# Patient Record
Sex: Male | Born: 1940 | Race: White | Hispanic: No | Marital: Married | Smoking: Never smoker
Health system: Southern US, Community
[De-identification: ages and names within clinical notes are randomized; demographics above are authoritative.]

## PROBLEM LIST (undated history)

## (undated) DIAGNOSIS — E785 Hyperlipidemia, unspecified: Secondary | ICD-10-CM

## (undated) HISTORY — DX: Hyperlipidemia, unspecified: E78.5

## (undated) HISTORY — PX: EYE SURGERY: SHX253

---

## 1997-05-10 HISTORY — PX: OTHER SURGICAL HISTORY: SHX169

## 2000-05-10 ENCOUNTER — Encounter: Payer: Self-pay | Admitting: Family Medicine

## 2001-12-08 ENCOUNTER — Encounter: Payer: Self-pay | Admitting: Family Medicine

## 2001-12-08 LAB — CONVERTED CEMR LAB: PSA: 0.6 ng/mL

## 2003-10-09 ENCOUNTER — Encounter: Payer: Self-pay | Admitting: Family Medicine

## 2003-10-09 LAB — CONVERTED CEMR LAB
PSA: 0.5 ng/mL
PSA: 0.5 ng/mL

## 2004-10-08 ENCOUNTER — Encounter: Payer: Self-pay | Admitting: Family Medicine

## 2004-10-08 LAB — CONVERTED CEMR LAB
PSA: 0.64 ng/mL
PSA: 0.64 ng/mL

## 2004-10-12 ENCOUNTER — Ambulatory Visit: Payer: Self-pay | Admitting: Family Medicine

## 2004-10-16 ENCOUNTER — Ambulatory Visit: Payer: Self-pay | Admitting: Family Medicine

## 2004-12-01 ENCOUNTER — Ambulatory Visit: Payer: Self-pay | Admitting: Family Medicine

## 2005-10-08 ENCOUNTER — Encounter: Payer: Self-pay | Admitting: Family Medicine

## 2005-10-08 LAB — CONVERTED CEMR LAB: PSA: 0.53 ng/mL

## 2005-10-18 ENCOUNTER — Ambulatory Visit: Payer: Self-pay | Admitting: Family Medicine

## 2005-10-21 ENCOUNTER — Ambulatory Visit: Payer: Self-pay | Admitting: Family Medicine

## 2005-11-05 ENCOUNTER — Ambulatory Visit: Payer: Self-pay | Admitting: Family Medicine

## 2006-03-22 ENCOUNTER — Ambulatory Visit: Payer: Self-pay | Admitting: Family Medicine

## 2006-10-25 ENCOUNTER — Ambulatory Visit: Payer: Self-pay | Admitting: Family Medicine

## 2006-10-25 LAB — CONVERTED CEMR LAB
ALT: 24 units/L (ref 0–40)
AST: 20 units/L (ref 0–37)
Alkaline Phosphatase: 70 units/L (ref 39–117)
BUN: 18 mg/dL (ref 6–23)
Bilirubin, Direct: 0.1 mg/dL (ref 0.0–0.3)
CO2: 29 meq/L (ref 19–32)
Calcium: 9.4 mg/dL (ref 8.4–10.5)
Creatinine, Ser: 0.8 mg/dL (ref 0.4–1.5)
GFR calc Af Amer: 125 mL/min
Glucose, Bld: 93 mg/dL (ref 70–99)
HDL: 71.3 mg/dL (ref >39.0)
Microalb Creat Ratio: 4.4 mg/g (ref 0.0–30.0)
Total Bilirubin: 0.9 mg/dL (ref 0.3–1.2)
Total CHOL/HDL Ratio: 2.8
Total Protein: 6.6 g/dL (ref 6.0–8.3)
Triglycerides: 48 mg/dL (ref 0–149)

## 2006-10-26 ENCOUNTER — Encounter: Payer: Self-pay | Admitting: Family Medicine

## 2006-10-26 DIAGNOSIS — M1990 Unspecified osteoarthritis, unspecified site: Secondary | ICD-10-CM | POA: Insufficient documentation

## 2006-10-26 DIAGNOSIS — E7800 Pure hypercholesterolemia, unspecified: Secondary | ICD-10-CM | POA: Insufficient documentation

## 2006-10-26 DIAGNOSIS — H9193 Unspecified hearing loss, bilateral: Secondary | ICD-10-CM | POA: Insufficient documentation

## 2006-10-26 DIAGNOSIS — E749 Disorder of carbohydrate metabolism, unspecified: Secondary | ICD-10-CM | POA: Insufficient documentation

## 2006-10-26 DIAGNOSIS — B009 Herpesviral infection, unspecified: Secondary | ICD-10-CM | POA: Insufficient documentation

## 2006-10-28 ENCOUNTER — Ambulatory Visit: Payer: Self-pay | Admitting: Family Medicine

## 2006-10-28 LAB — CONVERTED CEMR LAB
Glucose, Urine, Semiquant: NEGATIVE
Nitrite: NEGATIVE
Protein, U semiquant: NEGATIVE
Specific Gravity, Urine: 1.01
pH: 5

## 2006-11-09 ENCOUNTER — Encounter (INDEPENDENT_AMBULATORY_CARE_PROVIDER_SITE_OTHER): Payer: Self-pay | Admitting: *Deleted

## 2006-11-09 ENCOUNTER — Ambulatory Visit: Payer: Self-pay | Admitting: Family Medicine

## 2007-03-10 ENCOUNTER — Ambulatory Visit: Payer: Self-pay | Admitting: Family Medicine

## 2007-03-31 ENCOUNTER — Ambulatory Visit: Payer: Self-pay | Admitting: Family Medicine

## 2007-10-18 ENCOUNTER — Ambulatory Visit: Payer: Self-pay | Admitting: Family Medicine

## 2007-10-18 LAB — CONVERTED CEMR LAB
ALT: 20 units/L (ref 0–53)
AST: 23 units/L (ref 0–37)
Basophils Absolute: 0 10*3/uL (ref 0.0–0.1)
Basophils Relative: 0 % (ref 0.0–1.0)
Bilirubin, Direct: 0.1 mg/dL (ref 0.0–0.3)
CO2: 29 meq/L (ref 19–32)
Chloride: 104 meq/L (ref 96–112)
Cholesterol: 171 mg/dL (ref 0–200)
Glucose, Bld: 94 mg/dL (ref 70–99)
LDL Cholesterol: 90 mg/dL (ref 0–99)
Lymphocytes Relative: 29.3 % (ref 12.0–46.0)
MCHC: 34.7 g/dL (ref 30.0–36.0)
Monocytes Relative: 9.2 % (ref 3.0–12.0)
Neutrophils Relative %: 58.2 % (ref 43.0–77.0)
RBC: 4.66 M/uL (ref 4.22–5.81)
RDW: 12.5 % (ref 11.5–14.6)
Sodium: 141 meq/L (ref 135–145)
TSH: 1.15 microintl units/mL (ref 0.35–5.50)
Total Bilirubin: 1.2 mg/dL (ref 0.3–1.2)
Total CHOL/HDL Ratio: 2.3
VLDL: 6 mg/dL (ref 0–40)

## 2007-10-19 ENCOUNTER — Ambulatory Visit: Payer: Self-pay | Admitting: Family Medicine

## 2007-10-26 ENCOUNTER — Encounter: Payer: Self-pay | Admitting: Family Medicine

## 2007-12-07 ENCOUNTER — Encounter (INDEPENDENT_AMBULATORY_CARE_PROVIDER_SITE_OTHER): Payer: Self-pay | Admitting: *Deleted

## 2007-12-07 ENCOUNTER — Ambulatory Visit: Payer: Self-pay | Admitting: Family Medicine

## 2007-12-07 LAB — FECAL OCCULT BLOOD, GUAIAC: Fecal Occult Blood: NEGATIVE

## 2007-12-07 LAB — CONVERTED CEMR LAB
OCCULT 1: NEGATIVE
OCCULT 2: NEGATIVE
OCCULT 3: NEGATIVE

## 2008-01-05 ENCOUNTER — Encounter: Payer: Self-pay | Admitting: Family Medicine

## 2008-02-07 ENCOUNTER — Ambulatory Visit: Payer: Self-pay | Admitting: Family Medicine

## 2008-09-18 ENCOUNTER — Emergency Department (HOSPITAL_COMMUNITY): Admission: EM | Admit: 2008-09-18 | Discharge: 2008-09-18 | Payer: Self-pay | Admitting: Family Medicine

## 2008-10-22 ENCOUNTER — Ambulatory Visit: Payer: Self-pay | Admitting: Family Medicine

## 2008-10-22 LAB — CONVERTED CEMR LAB
ALT: 20 units/L (ref 0–53)
BUN: 18 mg/dL (ref 6–23)
CO2: 29 meq/L (ref 19–32)
Chloride: 108 meq/L (ref 96–112)
Cholesterol: 183 mg/dL (ref 0–200)
Eosinophils Relative: 1.8 % (ref 0.0–5.0)
Glucose, Bld: 104 mg/dL — ABNORMAL HIGH (ref 70–99)
HCT: 42.2 % (ref 39.0–52.0)
Lymphs Abs: 1.4 10*3/uL (ref 0.7–4.0)
MCV: 91.9 fL (ref 78.0–100.0)
Microalb, Ur: 0.5 mg/dL (ref 0.0–1.9)
Monocytes Absolute: 0.5 10*3/uL (ref 0.1–1.0)
PSA: 1.17 ng/mL (ref 0.10–4.00)
Platelets: 217 10*3/uL (ref 150.0–400.0)
Potassium: 4.1 meq/L (ref 3.5–5.1)
Total Bilirubin: 0.9 mg/dL (ref 0.3–1.2)
Total Protein: 6.7 g/dL (ref 6.0–8.3)
WBC: 6.1 10*3/uL (ref 4.5–10.5)

## 2008-10-24 LAB — CONVERTED CEMR LAB: Vit D, 25-Hydroxy: 32 ng/mL (ref 30–89)

## 2008-10-31 ENCOUNTER — Ambulatory Visit: Payer: Self-pay | Admitting: Family Medicine

## 2009-02-12 ENCOUNTER — Ambulatory Visit: Payer: Self-pay | Admitting: Family Medicine

## 2009-03-01 ENCOUNTER — Emergency Department (HOSPITAL_COMMUNITY): Admission: EM | Admit: 2009-03-01 | Discharge: 2009-03-02 | Payer: Self-pay | Admitting: Emergency Medicine

## 2009-03-25 ENCOUNTER — Ambulatory Visit: Payer: Self-pay | Admitting: Family Medicine

## 2009-03-31 ENCOUNTER — Ambulatory Visit: Payer: Self-pay | Admitting: Family Medicine

## 2009-10-03 ENCOUNTER — Emergency Department (HOSPITAL_COMMUNITY): Admission: EM | Admit: 2009-10-03 | Discharge: 2009-10-03 | Payer: Self-pay | Admitting: Emergency Medicine

## 2009-10-07 ENCOUNTER — Ambulatory Visit: Payer: Self-pay | Admitting: Family Medicine

## 2009-10-14 ENCOUNTER — Ambulatory Visit: Payer: Self-pay | Admitting: Family Medicine

## 2009-10-23 ENCOUNTER — Telehealth: Payer: Self-pay | Admitting: Family Medicine

## 2009-10-30 ENCOUNTER — Ambulatory Visit: Payer: Self-pay | Admitting: Family Medicine

## 2009-10-30 LAB — CONVERTED CEMR LAB
Albumin: 4.2 g/dL (ref 3.5–5.2)
Basophils Absolute: 0 10*3/uL (ref 0.0–0.1)
CO2: 30 meq/L (ref 19–32)
Calcium: 9.6 mg/dL (ref 8.4–10.5)
Cholesterol: 200 mg/dL (ref 0–200)
Creatinine, Ser: 0.8 mg/dL (ref 0.4–1.5)
Glucose, Bld: 97 mg/dL (ref 70–99)
HCT: 42.9 % (ref 39.0–52.0)
HDL: 87.8 mg/dL (ref >39.00)
Lymphs Abs: 1.5 10*3/uL (ref 0.7–4.0)
MCV: 92.2 fL (ref 78.0–100.0)
Microalb, Ur: 0.4 mg/dL (ref 0.0–1.9)
Monocytes Absolute: 0.5 10*3/uL (ref 0.1–1.0)
Platelets: 226 10*3/uL (ref 150.0–400.0)
RDW: 13.7 % (ref 11.5–14.6)
TSH: 1.29 microintl units/mL (ref 0.35–5.50)
Total Protein: 6.8 g/dL (ref 6.0–8.3)
Triglycerides: 39 mg/dL (ref 0.0–149.0)

## 2009-11-05 ENCOUNTER — Ambulatory Visit: Payer: Self-pay | Admitting: Family Medicine

## 2009-11-05 LAB — CONVERTED CEMR LAB
Glucose, Urine, Semiquant: NEGATIVE
Ketones, urine, test strip: NEGATIVE
Nitrite: NEGATIVE
Urobilinogen, UA: 0.2

## 2009-12-16 ENCOUNTER — Encounter (INDEPENDENT_AMBULATORY_CARE_PROVIDER_SITE_OTHER): Payer: Self-pay | Admitting: *Deleted

## 2010-02-10 ENCOUNTER — Ambulatory Visit: Payer: Self-pay | Admitting: Family Medicine

## 2010-02-18 ENCOUNTER — Ambulatory Visit: Payer: Self-pay | Admitting: Family Medicine

## 2010-02-18 DIAGNOSIS — L03039 Cellulitis of unspecified toe: Secondary | ICD-10-CM | POA: Insufficient documentation

## 2010-06-09 NOTE — Assessment & Plan Note (Signed)
Summary: ?INFECTED TOENAIL/CLE   Vital Signs:  Patient profile:   70 year old male Height:      67 inches Weight:      153 pounds BMI:     24.05 Temp:     97.5 degrees F oral Pulse rate:   72 / minute Pulse rhythm:   regular BP sitting:   110 / 60  (right arm) Cuff size:   regular  Vitals Entered By: Linde Gillis CMA Duncan Dull) (February 18, 2010 12:13 PM) CC: infected toenail   History of Present Illness: 70 yo here for ? infected toe.  Last year, had ingrown toe nail requiring part of his toenail to be removed (left great toe).  A couple of days ago, same toe became very red, painful.  He soaked it and applied hydrogen peroxide to it.  Seemed to drain something and now no longer red or painful.  Wanted to make sure it was ok. No fevers, chills, nausea or other systemic symptoms.  Current Medications (verified): 1)  Acyclovir 200 Mg  Caps (Acyclovir) .Marland Kitchen.. 1 By Mouth Two Times A Day  (Dr. Dagoberto Ligas) 2)  One-Daily Multivitamins   Tabs (Multiple Vitamin) .Marland Kitchen.. 1 Daily By Mouth 3)  Adult Aspirin Low Strength 81 Mg  Tbdp (Aspirin) .... One A Day 4)  Zostavax 16109 Unt/0.50ml Solr (Zoster Vaccine Live) .... Administer To Pt 5)  Cephalexin 500 Mg  Tabs (Cephalexin) .... Take One By Mouth Two Times A Day X 10 Days  Allergies (verified): No Known Drug Allergies  Past History:  Past Medical History: Last updated: 10/26/2006 Hyperlipidemia Osteoarthritis  Past Surgical History: Last updated: November 15, 2009 Abnl GTT Hyperglycemia 1980 R Thumb surgery  Hebreden's node  ([place]her) 1999 Colonoscopy Sm Int Hemms o/w Nml  (Dr Loreta Ave) 01/05/2008 Catarract Left Eye 11/10  Family History: Last updated: Nov 15, 2009 Father: Died 11 arthritis, sleep apnea Mother: Died 21 Peritonitis due to colonic rupture Brother dec 55 ETOH Brother A 70  Craige Cotta)  Colonic Polyps ([employer]) (retired) Brother A  67 Rocky Link) Skin Ca  [employer] (works with son) CA:  Prostate PGF Polyps:  Brother ETOH:  Brother  (+) Stroke:  (-)  Social History: Last updated: 10/31/2008 Marital Status: Married lives with wife Children: 1 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 GI:  Denies nausea and vomiting.  Physical Exam  General:  Well-developed,well-nourished,in no acute distress; alert,appropriate and cooperative throughout examination, nontoxic. Msk:  left great toe- no ingrown toenail, no obvious signs of infection- non tender, no swelling or erythema. Extremities:  no edema Neurologic:  alert & oriented X3 and gait normal.   Psych:  Cognition and judgment appear intact. Alert and cooperative with normal attention span and concentration. No apparent delusions, illusions, hallucinations   Impression & Recommendations:  Problem # 1:  ONYCHIA AND PARONYCHIA OF TOE (ICD-681.11) Assessment New Likely drained the abscess on his own. Will treat with Keflex.  If symptoms return, needs to follow up for drainage. His updated medication list for this problem includes:    Cephalexin 500 Mg Tabs (Cephalexin) .Marland Kitchen... Take one by mouth two times a day  x 10 days  Orders: Prescription Created Electronically [phone])  Complete Medication List: 1)  Acyclovir 200 Mg Caps (Acyclovir) .Marland Kitchen.. 1 by mouth two times a day  (dr. Dagoberto Ligas) 2)  One-daily Multivitamins Tabs (Multiple vitamin) .Marland Kitchen.. 1 daily by mouth 3)  Adult Aspirin Low Strength 81 Mg Tbdp (Aspirin) .... One a day 4)  Zostavax 60454 Unt/0.70ml Solr (Zoster vaccine live) .... Administer to pt 5)  Cephalexin  500 Mg Tabs (Cephalexin) .... Take one by mouth two times a day x 10 days Prescriptions: CEPHALEXIN 500 MG  TABS (CEPHALEXIN) take one by mouth two times a day x 10 days  #20 x 0   Entered and Authorized by:   Ruthe Mannan MD   Signed by:   Ruthe Mannan MD on 02/18/2010   Method used:   Electronically to        [facility]  [address] (retail)       [address]       [place], [place]  [address]       Ph: [phone] or [phone]       Fax: [phone]   RxID:   [id number]   Current Allergies (reviewed today): No known allergies

## 2010-06-09 NOTE — Assessment & Plan Note (Signed)
Summary: FLU SHOT/Bryan Avila/DLO  Nurse Visit   Allergies: No Known Drug Allergies  Orders Added: 1)  Flu Vaccine 47yrs + MEDICARE PATIENTS [Q2039] 2)  Administration Flu vaccine - MCR [G0008]  Flu Vaccine Consent Questions     Do you have a history of severe allergic reactions to this vaccine? no    Any prior history of allergic reactions to egg and/or gelatin? no    Do you have a sensitivity to the preservative Thimersol? no    Do you have a past history of Guillan-Barre Syndrome? no    Do you currently have an acute febrile illness? no    Have you ever had a severe reaction to latex? no    Vaccine information given and explained to patient? yes    Are you currently pregnant? no    Lot Number:[id number]   Exp Date:11/07/2010   Site Given  Left Deltoid IMu

## 2010-06-09 NOTE — Letter (Signed)
Summary: Nadara Eaton letter  [facility] at [facility]  [address] [place], [place] [address]   Phone: [phone]  Fax: [phone]       12/16/2009 MRN: [id number]  [facility] [address] [place], [place]  [address]  Dear Mr. Rosasco,  [facility] [place], and [place] announce the retirement of Arta Silence, M.D., from full-time practice at the [facility] office effective November 06, 2009 and his plans of returning part-time.  It is important to Dr. Hetty Ely and to our practice that you understand that [facility] has seven physicians in our office for your health care needs.  We will continue to offer the same exceptional care that you have today.    Dr. Hetty Ely has spoken to many of you about his plans for retirement and returning part-time in the fall.   We will continue to work with you through the transition to schedule appointments for you in the office and meet the high standards that [facility] is committed to.   Again, it is with great pleasure that we share the news that Dr. Hetty Ely will return to [facility] in October of 2011 with a reduced schedule.    If you have any questions, or would like to request an appointment with one of our physicians, please call us at [phone] and press the option for Scheduling an appointment.  We take pleasure in providing you with excellent patient care and look forward to seeing you at your next office visit.  Our [facility] Physicians are:  Tillman Abide, M.D. Laurita Quint, M.D. Roxy Manns, M.D. Kerby Nora, M.D. Hannah Beat, M.D. Ruthe Mannan, M.D. We proudly welcomed Raechel Ache, M.D. and Eustaquio Boyden, M.D. to the practice in July/August 2011.  Sincerely,  [facility]

## 2010-06-09 NOTE — Assessment & Plan Note (Signed)
Summary: CPX/BIR  R/S FROM 11/06/09   Vital Signs:  Patient profile:   70 year old male Weight:      150.75 pounds Temp:     97.8 degrees F oral Pulse rate:   64 / minute Pulse rhythm:   regular BP sitting:   120 / 84  (left arm) Cuff size:   regular  Vitals Entered By: Sydell Axon LPN (2009/11/23 9:20 AM) CC: 30 Minute checkup, needs DOT forms completed, had a colonoscopy 08/09 by Dr. Loreta Ave  Vision Screening:Left eye with correction: 20 / 70 Right eye with correction: 20 / 25 Both eyes with correction: 20 / 25        20db HL: Left  500 hz: 25db 1000 hz: 25db 2000 hz: No Response 4000 hz: No Response Right  500 hz: 25db 1000 hz: 25db 2000 hz: No Response 4000 hz: No Response    History of Present Illness: Pt here for followup. He has no complaints today. He has had catarract surgery on the left eye that has not done much. He has some slight hearing loss. 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 1 By Mouth Two Times A Day  (Dr. Dagoberto Ligas) 2)  One-Daily Multivitamins   Tabs (Multiple Vitamin) .Marland Kitchen.. 1 Daily By  Mouth 3)  Adult Aspirin Low Strength 81 Mg  Tbdp (Aspirin) .... One A Day 4)  Bactroban 2 % Oint (Mupirocin) .... Apply To Left Thumb After Soaking. 5)  Keflex 500 Mg Caps (Cephalexin) .... One Tab By Mouth 4 Times A Day.  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 [employer]  Hobbies:  Audiological scientist  Rents 150-172  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 Smoked Alcohol use-no Drug use-no Regular exercise-no  Risk Factors: Alcohol Use: 0 (11/23/2009) Caffeine Use: 2 (2009/11/23) Exercise: yes (11-23-09)  Risk Factors: Smoking Status: never (11-23-2009) Passive Smoke Exposure: no (November 23, 2009)  Past Surgical History: Abnl GTT Hyperglycemia 1980 R Thumb surgery  Hebreden's node  ([place]her) 1999 Colonoscopy Sm Int Hemms  o/w Nml  (Dr Loreta Ave) 01/05/2008 Catarract Left Eye 11/10  Family History: Father: Died 69 arthritis, sleep apnea Mother: Died 58 Peritonitis due to colonic rupture Brother dec 55 ETOH Brother A 70  Craige Cotta)  Colonic Polyps ([employer]) (retired) Brother A  67 Rocky Link) Skin Ca  [employer] (works with son) CA:  Prostate PGF Polyps:  Brother ETOH:  Brother (+) Stroke:  (-)  Social History: Does Patient Exercise:  yes  Review of Systems General:  Denies chills, fatigue, fever, sweats, weakness, and weight loss. Eyes:  Denies blurring, discharge, and  eye pain; has had viral infection of lkeft eye, Acyclovir keeps under control.. ENT:  Complains of decreased hearing; denies ear discharge, earache, and ringing in ears. CV:  Denies chest pain or discomfort, fainting, fatigue, palpitations, shortness of breath with exertion, swelling of feet, and swelling of hands. Resp:  Denies cough, shortness of breath, and wheezing. GI:  Denies abdominal pain, bloody stools, change in bowel habits, constipation, dark tarry stools, diarrhea, indigestion, loss of appetite, nausea, vomiting, vomiting blood, and yellowish skin color. GU:  Complains of nocturia; denies discharge, dysuria, and urinary frequency; occas. MS:  Complains of muscle aches; denies joint pain, low back pain, cramps, and stiffness; occas hand soreness. Derm:  Denies dryness, itching, and rash. Neuro:  Denies memory loss, numbness, poor balance, tingling, and tremors.  Physical Exam  General:  Well-developed,well-nourished,in no acute distress; alert,appropriate and cooperative throughout examination, nontoxic. Head:  Normocephalic and atraumatic without obvious abnormalities. No apparent alopecia or balding. Sinuses NT. Eyes:  Conjunctiva clear bilaterally.  Ears:  External ear exam shows no significant lesions or deformities.  Otoscopic examination reveals clear canals, tympanic membranes are intact bilaterally without bulging, retraction, inflammation or discharge. Hearing is grossly normal bilaterally. Cerumen in left ear. Nose:  External nasal examination shows no deformity or inflammation. Nasal mucosa are pink and moist without lesions or exudates. Mouth:  Oral mucosa and oropharynx without lesions or exudates.  Teeth in good repair. Neck:  No deformities, masses, or tenderness noted. Chest Wall:  No deformities, masses, tenderness or gynecomastia noted. Breasts:  No masses or gynecomastia noted Lungs:  Normal respiratory effort, chest expands symmetrically. Lungs are clear to  auscultation, no crackles or wheezes. Heart:  Normal rate and regular rhythm. S1 and S2 normal without gallop, murmur, click, rub or other extra sounds. Abdomen:  Bowel sounds positive,abdomen soft and non-tender without masses, organomegaly or hernias noted. Rectal:  No external abnormalities noted. Tight sphincter tone. No rectal masses or tenderness. G neg. Genitalia:  Testes bilaterally descended without nodularity, tenderness or masses. No scrotal masses or lesions. No penis lesions or urethral discharge. Prostate:  Prostate gland firm and smooth, no enlargement, nodularity, tenderness, mass, asymmetry or induration. 10-20gms. Msk:  No deformity or scoliosis noted of thoracic or lumbar spine.   Pulses:  R and L carotid,radial,femoral,dorsalis pedis and posterior tibial pulses are full and equal bilaterally Extremities:  Thumb swelling and tenderness resolved with sloughing skiin and no induration or fluctulanceO/W all nml. Neurologic:  No cranial nerve deficits noted. Station and gait are normal. Plantar reflexes are down-going bilaterally. DTRs are symmetrical throughout. Sensory, motor and coordinative functions appear intact. Skin:  Intact without suspicious lesions or rashes Cervical Nodes:  No lymphadenopathy noted Inguinal Nodes:  No significant adenopathy Psych:  Cognition and judgment appear intact. Alert and cooperative with normal attention span and concentration. No apparent delusions, illusions, hallucinations   Impression & Recommendations:  Problem # 1:  SCREENING FOR MALIGNANT  NEOPLASM, PROSTATE (ICD-V76.44) Assessment Unchanged Stable PSA and exam.  Problem # 2:  FAMILY HISTORY COLONIC POLYPS (ICD-V18.51) Colonoscopy UTD.  Problem # 3:  OSTEOARTHRITIS (ICD-715.90) Assessment: Unchanged Stable. His updated medication list for this problem includes:    Adult Aspirin Low Strength 81 Mg Tbdp (Aspirin) ..... One a day  Problem # 4:  HYPERCHOLESTEROLEMIA, PURE  (ICD-272.0) Assessment: Unchanged Adequate but avoid sweetsa and carbs to decrease LDL. 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 Great job!!  Problem # 6:  HEALTH SCREENING (ICD-V70.0) Assessment: New Suggest Zostavax. Script given.  Complete Medication List: 1)  Acyclovir 200 Mg Caps (Acyclovir) .Marland Kitchen.. 1 by mouth two times a day  (dr. Dagoberto Ligas) 2)  One-daily Multivitamins Tabs (Multiple vitamin) .Marland Kitchen.. 1 daily by mouth 3)  Adult Aspirin Low Strength 81 Mg Tbdp (Aspirin) .... One a day 4)  Zostavax 43329 Unt/0.76ml Solr (Zoster vaccine live) .... Administer to pt  Other Orders: UA Dipstick W/ Micro (manual) (51884) Audiometry (16606) Vision Screening (30160)  Patient Instructions: 1)  RTC one year, sooner as needed. Prescriptions: ZOSTAVAX 10932 UNT/0.65ML SOLR (ZOSTER VACCINE LIVE) administer to pt  #1 x 0   Entered and Authorized by:   Shaune Leeks MD   Signed by:   Shaune Leeks MD on 11/05/2009   Method used:   Print then Give to Patient   RxID:   [id number]   Current Allergies (reviewed today): No known allergies   Laboratory Results   Urine Tests  Date/Time Received: November 05, 2009 9:44 AM  Date/Time Reported: November 05, 2009 9:44 AM   Routine Urinalysis   Color: yellow Appearance: Clear Glucose: negative   (Normal Range: Negative) Bilirubin: negative   (Normal Range: Negative) Ketone: negative   (Normal Range: Negative) Spec. Gravity: 1.020   (Normal Range: 1.003-1.035) Blood: small   (Normal Range: Negative) pH: 6.0   (Normal Range: 5.0-8.0) Protein: trace   (Normal Range: Negative) Urobilinogen: 0.2   (Normal Range: 0-1) Nitrite:  negative   (Normal Range: Negative) Leukocyte Esterace: negative   (Normal Range: Negative)        Appended Document: CPX/BIR  R/S FROM 11/06/09 U/A micro clean, no RBCs, WBCs or bact  seen.

## 2010-06-09 NOTE — Progress Notes (Signed)
Summary: Progress on thumbnail infection  Phone Note Call from Patient Call back at Home Phone [phone]   Caller: Patient Call For: Shaune Leeks MD Summary of Call: Patient was in to see you recently with an infected thumbnail.  He says you asked him to call in and report how his thumb is doing.  He says it is much better, almost completely healed. Initial call taken by: Delilah Shan CMA Duncan Dull),  October 23, 2009 11:21 AM  Follow-up for Phone Call        Thank you. Follow-up by: Shaune Leeks MD,  October 23, 2009 11:35 AM

## 2010-06-09 NOTE — Assessment & Plan Note (Signed)
Summary: 1 WEEK FOLLOW UP /RBH   Vital Signs:  Patient profile:   70 year old male Weight:      154.25 pounds Temp:     98.5 degrees F oral Pulse rate:   72 / minute Pulse rhythm:   regular BP sitting:   112 / 72  (left arm) Cuff size:   regular  Vitals Entered By: Sydell Axon LPN (October 14, 1608 12:28 PM) CC: One week follow-up on thumb   History of Present Illness: Pt here for one week followup ot thumb infection from around the nail, initially seen at another site with I&D which didn't make much difference. He was seen here one week ago and gived Keflex and told to soak and apply Bactroban and things have improved. He feels the infection is about gone.He feels well otherwise.  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 1 By Mouth Two Times A Day  (Dr. Dagoberto Ligas) 2)  One-Daily Multivitamins   Tabs (Multiple Vitamin) .Marland Kitchen.. 1 Daily By Mouth 3)  Adult Aspirin Low Strength 81 Mg  Tbdp (Aspirin) .... One A Day 4)  Ibuprofen 800 Mg Tabs (Ibuprofen) .... Take One By Mouth Three Times A Day With Food 5)  Ultram 50 Mg Tabs (Tramadol Hcl) .... Take 1-2 By Mouth Every 6 Hours As Needed Pain 6)  Bactroban 2 % Oint (Mupirocin) .... Apply To Left Thumb After Soaking. 7)  Keflex 500 Mg Caps (Cephalexin) .... One Tab By Mouth 4 Times A Day.  Allergies: No Known Drug Allergies  Physical Exam  General:  Well-developed,well-nourished,in no acute distress; alert,appropriate and cooperative throughout examination,  nontoxic. Head:  Normocephalic and atraumatic without obvious abnormalities. No apparent alopecia or balding. Eyes:  Conjunctiva clear bilaterally.  Extremities:  Swelling and tenderness resolved with sloughing skiin and no induration or fluctulance.   Impression & Recommendations:  Problem # 1:  PARONYCHIA OF LEFT THUMB (ICD-681.02) Assessment Improved Finish Abs and let me know if any sxs remain. His updated medication list for this problem includes:    Keflex 500 Mg Caps (Cephalexin) ..... One tab by mouth 4 times a day.  Complete Medication List: 1)  Acyclovir 200 Mg Caps (Acyclovir) .Marland Kitchen.. 1 by mouth two times a day  (dr. Dagoberto Ligas) 2)  One-daily Multivitamins Tabs (Multiple vitamin) .Marland Kitchen.. 1 daily by mouth 3)  Adult Aspirin Low Strength 81 Mg Tbdp (Aspirin) .... One a day 4)  Bactroban 2 % Oint (Mupirocin) .... Apply to left thumb after soaking. 5)  Keflex 500 Mg Caps (Cephalexin) .... One tab by mouth 4 times a day.  Current Allergies (reviewed today): No known allergies

## 2010-06-09 NOTE — Assessment & Plan Note (Signed)
Summary: F/U [place] ON 10/02/09/CLE   Vital Signs:  Patient profile:   70 year old male Weight:      152.75 pounds Temp:     98.0 degrees F oral Pulse rate:   60 / minute Pulse rhythm:   regular BP sitting:   104 / 64  (left arm) Cuff size:   regular  Vitals Entered By: Sydell Axon LPN (Oct 07, 2009 12:20 PM) CC: Follow-up from [facility] ER, infected thumb on left hand   History of Present Illness: Pt seen last Fri at [facility] ER for paronychia and was cut along the proximal nail to alleviate pus and pressure. He was not put on Abs and the thumb is still swollen, sore and erythem. He deines fever or chills. He was given Ultram and Motrin for pain, neither of which is the pt really taking. he has avoided Motrin comopletely and taken Ultram sparingly. He is working one handed at work as it hurt to use the thumb.  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 1 By Mouth Two Times A Day  (Dr. Dagoberto Ligas) 2)  One-Daily Multivitamins   Tabs (Multiple Vitamin) .Marland Kitchen.. 1 Daily By Mouth 3)  Adult Aspirin Low Strength 81 Mg  Tbdp (Aspirin) .... One A Day 4)  Keflex 500 Mg Caps (Cephalexin) .... One Tab By Mouth Two Times A Day  Allergies: No Known Drug Allergies  Physical Exam  General:  Well-developed,well-nourished,in no acute distress; alert,appropriate and cooperative throughout examination, nontoxic. Extremities:  Left thumb swollen and inflamed with irritation along the proximal nailbed and cut just above as  well. Erythema, most intense around the paranychial area but generally around the prox nailbed and up to the DIP joint. Exquisitre tenderness to palpation.   Impression & Recommendations:  Problem # 1:  PARONYCHIA OF LEFT THUMB (ICD-681.02) Assessment New  Start soaking, Keflex and Bactroban as discussed. See me Thu if no improvement. RTC oner week for recheck. The following medications were removed from the medication list:    Keflex 500 Mg Caps (Cephalexin) ..... One tab by mouth two times a day His updated medication list for this problem includes:    Keflex 500 Mg Caps (Cephalexin) ..... One tab by mouth 4 times a day.  Elevate affected area. Warm moist compresses for 20 minutes every 2 hours while awake. Take antibiotics as directed and take acetaminophen as needed. To be seen in 48-72 hours if no improvement, sooner if worse.  Orders: Prescription Created Electronically [id number])  Complete Medication List: 1)  Acyclovir 200 Mg Caps (Acyclovir) .Marland Kitchen.. 1 by mouth two times a day  (dr. Dagoberto Ligas) 2)  One-daily Multivitamins Tabs (Multiple vitamin) .Marland Kitchen.. 1 daily by mouth 3)  Adult Aspirin Low Strength 81 Mg Tbdp (Aspirin) .... One a day 4)  Ibuprofen 800 Mg Tabs (Ibuprofen) .... Take one by mouth three times a day with food 5)  Ultram 50 Mg Tabs (Tramadol hcl) .... Take 1-2 by mouth every 6 hours as needed pain 6)  Bactroban 2 %  Oint (Mupirocin) .... Apply to left thumb after soaking. 7)  Keflex 500 Mg Caps (Cephalexin) .... One tab by mouth 4 times a day.  Patient Instructions: 1)  RTC one week. Prescriptions: KEFLEX 500 MG CAPS (CEPHALEXIN) one tab by mouth 4 times a day.  #40 x 0   Entered and Authorized by:   Shaune Leeks MD   Signed by:   Shaune Leeks MD on 10/07/2009   Method used:   Electronically to        [facility]  [place] (retail)       [address]       [place], [place]  [address]       Ph: [phone] or [phone]       Fax:  [phone]   RxID:   [id number] BACTROBAN 2 % OINT (MUPIROCIN) apply to left thumb after soaking.  #1 tube x 0   Entered and Authorized by:   Shaune Leeks MD   Signed by:   Shaune Leeks MD on 10/07/2009   Method used:   [facility] to        [facility]  [place] (retail)       [address]       [place], [place]  [address]       Ph: [phone] or [phone]       Fax: [phone]   RxID:   [id number]   Current Allergies (reviewed today): No known allergies

## 2010-08-13 LAB — URINALYSIS, ROUTINE W REFLEX MICROSCOPIC
Hgb urine dipstick: NEGATIVE
Leukocytes, UA: NEGATIVE
Nitrite: NEGATIVE
Specific Gravity, Urine: 1.026 (ref 1.005–1.030)
Urobilinogen, UA: 1 mg/dL (ref 0.0–1.0)

## 2010-08-13 LAB — COMPREHENSIVE METABOLIC PANEL
Albumin: 3.5 g/dL (ref 3.5–5.2)
Alkaline Phosphatase: 81 U/L (ref 39–117)
BUN: 19 mg/dL (ref 6–23)
CO2: 27 mEq/L (ref 19–32)
Chloride: 108 mEq/L (ref 96–112)
Creatinine, Ser: 0.91 mg/dL (ref 0.4–1.5)
GFR calc non Af Amer: >60 mL/min (ref >60)
Potassium: 3.9 mEq/L (ref 3.5–5.1)
Total Bilirubin: 0.7 mg/dL (ref 0.3–1.2)

## 2010-08-13 LAB — URINE CULTURE: Culture: NO GROWTH

## 2010-08-13 LAB — URINE MICROSCOPIC-ADD ON

## 2010-08-13 LAB — DIFFERENTIAL
Eosinophils Relative: 0 % (ref 0–5)
Lymphocytes Relative: 2 % — ABNORMAL LOW (ref 12–46)
Lymphs Abs: 0.3 10*3/uL — ABNORMAL LOW (ref 0.7–4.0)
Monocytes Absolute: 1 10*3/uL (ref 0.1–1.0)
Monocytes Relative: 6 % (ref 3–12)
Neutro Abs: 15 10*3/uL — ABNORMAL HIGH (ref 1.7–7.7)

## 2010-08-13 LAB — POCT I-STAT, CHEM 8
Creatinine, Ser: 1 mg/dL (ref 0.4–1.5)
Glucose, Bld: 123 mg/dL — ABNORMAL HIGH (ref 70–99)
HCT: 47 % (ref 39.0–52.0)
Hemoglobin: 16 g/dL (ref 13.0–17.0)
Potassium: 4 mEq/L (ref 3.5–5.1)
Sodium: 142 mEq/L (ref 135–145)
TCO2: 25 mmol/L (ref 0–100)

## 2010-08-13 LAB — CBC
HCT: 45.5 % (ref 39.0–52.0)
Hemoglobin: 15.3 g/dL (ref 13.0–17.0)
RBC: 4.87 MIL/uL (ref 4.22–5.81)
WBC: 16.4 10*3/uL — ABNORMAL HIGH (ref 4.0–10.5)

## 2010-08-29 ENCOUNTER — Inpatient Hospital Stay (INDEPENDENT_AMBULATORY_CARE_PROVIDER_SITE_OTHER)
Admission: RE | Admit: 2010-08-29 | Discharge: 2010-08-29 | Disposition: A | Discharge status: Home / Self Care | Payer: Medicare Other | Source: Ambulatory Visit | Attending: Emergency Medicine | Admitting: Emergency Medicine

## 2010-08-29 DIAGNOSIS — L0291 Cutaneous abscess, unspecified: Secondary | ICD-10-CM

## 2010-09-09 ENCOUNTER — Encounter: Payer: Self-pay | Admitting: Family Medicine

## 2010-09-10 ENCOUNTER — Ambulatory Visit (INDEPENDENT_AMBULATORY_CARE_PROVIDER_SITE_OTHER): Payer: Medicare Other | Admitting: Family Medicine

## 2010-09-10 ENCOUNTER — Encounter: Payer: Self-pay | Admitting: Family Medicine

## 2010-09-10 VITALS — BP 120/80 | HR 64 | Temp 98.1°F | Ht 68.0 in | Wt 149.1 lb

## 2010-09-10 DIAGNOSIS — Z136 Encounter for screening for cardiovascular disorders: Secondary | ICD-10-CM

## 2010-09-10 DIAGNOSIS — R5383 Other fatigue: Secondary | ICD-10-CM

## 2010-09-10 DIAGNOSIS — I4510 Unspecified right bundle-branch block: Secondary | ICD-10-CM

## 2010-09-10 DIAGNOSIS — R42 Dizziness and giddiness: Secondary | ICD-10-CM

## 2010-09-10 DIAGNOSIS — R5381 Other malaise: Secondary | ICD-10-CM

## 2010-09-10 LAB — CBC WITH DIFFERENTIAL/PLATELET
Basophils Absolute: 0 10*3/uL (ref 0.0–0.1)
Eosinophils Absolute: 0.2 10*3/uL (ref 0.0–0.7)
HCT: 44.3 % (ref 39.0–52.0)
Lymphs Abs: 1.8 10*3/uL (ref 0.7–4.0)
MCV: 92.4 fl (ref 78.0–100.0)
Monocytes Absolute: 0.6 10*3/uL (ref 0.1–1.0)
Neutrophils Relative %: 60.5 % (ref 43.0–77.0)
Platelets: 225 10*3/uL (ref 150.0–400.0)
RDW: 14.4 % (ref 11.5–14.6)
WBC: 6.7 10*3/uL (ref 4.5–10.5)

## 2010-09-10 LAB — BASIC METABOLIC PANEL
Calcium: 9.4 mg/dL (ref 8.4–10.5)
GFR: 103.16 mL/min (ref >60.00)
Potassium: 4.3 mEq/L (ref 3.5–5.1)
Sodium: 138 mEq/L (ref 135–145)

## 2010-09-10 LAB — TSH: TSH: 1.13 u[IU]/mL (ref 0.35–5.50)

## 2010-09-10 MED ORDER — MECLIZINE HCL 12.5 MG PO TABS: 12.5000 mg | ORAL_TABLET | Freq: Three times a day (TID) | ORAL | Status: DC | PRN

## 2010-09-10 NOTE — Assessment & Plan Note (Signed)
New. Seems most consistent with vertigo but given his new Right bundle branch block, will refer to cards. Orders Placed This Encounter  Procedures  . CBC w/Diff  . Basic Metabolic Panel (BMET)  . TSH  . Ambulatory referral to Cardiology  . EKG

## 2010-09-10 NOTE — Assessment & Plan Note (Signed)
New. Unable to find old EKG for comparison. Given that this is a new finding, he is bradycardic and presenting with dizziness, refer to cardiology immediately for further work up.

## 2010-09-10 NOTE — Patient Instructions (Signed)
Please stop by to see Bryan Avila on your way out. Please call us if you develop anymore dizziness.

## 2010-09-10 NOTE — Progress Notes (Signed)
70 yo here for two episodes of dizziness.  First episode occurred two weeks ago. Was on floor playing with grandchildren, stood up and room was spinning. Sat down and 5 minutes later symptoms resolved. This episode was not associated with nausea, vomiting, CP or SOB.  Occurred again two days ago. Lying in bed taking a nap with grandson. Stood up and room was spinning. This time was very nauseated but did not vomit. Felt a little "drunk" when he walked. Sat down and symptoms resolved within five minutes. No associated with CP, SOB, blurred vision or diaphoresis. Never had anythin glike this in past. Not a smoker.  No FH of CAD. Does have PMH of HLD.  The PMH, PSH, Social History, Family History, Medications, and allergies have been reviewed in [facility], and have been updated if relevant.  ROS: See HPI Patient reports no  vision/ hearing changes,anorexia, weight change, fever ,adenopathy, persistant / recurrent hoarseness, swallowing issues, chest pain,palpitations, edema,persistant / recurrent cough, hemoptysis, dyspnea(rest, exertional, paroxysmal nocturnal), gastrointestinal  bleeding (melena, rectal bleeding), abdominal pain, excessive heart burn, GU symptoms( dysuria, hematuria, pyuria, voiding/incontinence  Issues) syncope, focal weakness, memory loss,numbness & tingling, skin/hair/nail changes,depression, anxiety, abnormal bruising/bleeding, musculoskeletal symptoms/signs.  Physical exam: BP 120/80  Pulse 64  Temp(Src) 98.1 F (36.7 C) (Oral)  Ht 5\' 8"  (1.727 m)  Wt 149 lb 1.9 oz (67.64 kg)  BMI 22.67 kg/m2 Gen:  Alert, mildly anxious, NAD. HEENT:  MMM, neg dix hallpike TMs clear bilaterally. Resp:  CTA bilaterally CVS:  RRR Ext: no edema Neuro:  CN II-XII intact, normal and symmetrical strength and reflexes bilaterally. Normal gait.

## 2010-09-15 ENCOUNTER — Ambulatory Visit: Payer: Medicare Other | Admitting: Cardiovascular Disease

## 2010-09-16 ENCOUNTER — Ambulatory Visit (INDEPENDENT_AMBULATORY_CARE_PROVIDER_SITE_OTHER): Payer: Medicare Other | Admitting: Cardiovascular Disease

## 2010-09-16 ENCOUNTER — Encounter: Payer: Self-pay | Admitting: Cardiovascular Disease

## 2010-09-16 DIAGNOSIS — R5381 Other malaise: Secondary | ICD-10-CM

## 2010-09-16 DIAGNOSIS — R42 Dizziness and giddiness: Secondary | ICD-10-CM

## 2010-09-16 DIAGNOSIS — E7800 Pure hypercholesterolemia, unspecified: Secondary | ICD-10-CM

## 2010-09-16 DIAGNOSIS — R5383 Other fatigue: Secondary | ICD-10-CM

## 2010-09-16 DIAGNOSIS — Z0000 Encounter for general adult medical examination without abnormal findings: Secondary | ICD-10-CM

## 2010-09-16 DIAGNOSIS — I4510 Unspecified right bundle-branch block: Secondary | ICD-10-CM

## 2010-09-16 NOTE — Progress Notes (Signed)
   Patient ID: Bryan Avila, male    DOB: 09/05/1940, 70 y.o.   MRN: [id number]  HPI Comments: Bryan Avila is a very pleasant 70 year old gentleman, patient of Dr. Dayton Martes, who presents by referral for abnormal EKG and recent episodes of dizziness.  He reports that he had 2 episodes of dizziness over the past month. The first episode was 3 weeks ago, second episode was one week ago. Each episode lasted for 10 minutes. In each episode, he had risen from a chair or stood up from the couch. He does report having similar episodes if he has had something with sugar the night before in his diet. He has done this before and is typically very cautious about eating any foods with sugar. He has not had any episodes over the past week. He typically is very active, walks 30 minutes at a time and has done so over the past week with no symptoms.   EKG shows normal sinus rhythm with rate 62 beats per minute, right bundle branch block     Review of Systems  Constitutional: Negative.   HENT: Negative.   Eyes: Negative.   Respiratory: Negative.   Cardiovascular: Negative.   Gastrointestinal: Negative.   Musculoskeletal: Negative.   Skin: Negative.   Neurological: Positive for dizziness.  Hematological: Negative.   Psychiatric/Behavioral: Negative.   All other systems reviewed and are negative.    BP 122/68  Pulse 62  Ht 5\' 8"  (1.727 m)  Wt 151 lb 12.8 oz (68.856 kg)  BMI 23.08 kg/m2   Physical Exam  Nursing note and vitals reviewed. Constitutional: He is oriented to person, place, and time. He appears well-developed and well-nourished.  HENT:  Head: Normocephalic.  Nose: Nose normal.  Mouth/Throat: Oropharynx is clear and moist.  Eyes: Conjunctivae are normal. Pupils are equal, round, and reactive to light.  Neck: Normal range of motion. Neck supple. No JVD present.  Cardiovascular: Normal rate, regular rhythm, S1 normal, S2 normal, normal heart sounds and intact distal pulses.  Exam reveals no  gallop and no friction rub.   No murmur heard. Pulmonary/Chest: Effort normal and breath sounds normal. No respiratory distress. He has no wheezes. He has no rales. He exhibits no tenderness.  Abdominal: Soft. Bowel sounds are normal. He exhibits no distension. There is no tenderness.  Musculoskeletal: Normal range of motion. He exhibits no edema and no tenderness.  Lymphadenopathy:    He has no cervical adenopathy.  Neurological: He is alert and oriented to person, place, and time. Coordination normal.  Skin: Skin is warm and dry. No rash noted. No erythema.  Psychiatric: He has a normal mood and affect. His behavior is normal. Judgment and thought content normal.           Assessment and Plan

## 2010-09-16 NOTE — Assessment & Plan Note (Signed)
He does have right bundle branch block. He is exercising on a regular basis without any significant symptoms. Clinical exam is essentially benign. We will hold off on any further workup at this time as this is likely a benign finding.

## 2010-09-16 NOTE — Assessment & Plan Note (Signed)
He does not have a significant family history of coronary artery disease. His brother did have a significant cardiac bowel issue. On clinical exam, Bryan Avila does not have any signs of valvular heart disease. Cholesterol from last year was 200, LDL 100. We have encouraged him to work on his diet and exercise.

## 2010-09-16 NOTE — Assessment & Plan Note (Signed)
We did discuss his dizzy episodes with him. He has had 2 episodes, none for the past week. Symptoms seemed somewhat positional, possible orthostasis though they did last for at least 10 minutes. He does report having similar episodes if he has sugar in his diet, typically the night before. As he is currently asymptomatic, continues to exercise without symptoms, we have suggested that we watch him closely. He Is not particularly interested in performing a Holter or loop monitor at this time. We have suggested if he has additional episodes, that he contact our office to be set up for a Holter or event monitor. This hernia possible that he has no structural heart disease, but rather may have underlying arrhythmia.

## 2010-09-16 NOTE — Assessment & Plan Note (Signed)
As mentioned, total cholesterol of 200, LDL 100. We have encouraged him to work on his diet, continue his exercise.

## 2010-09-16 NOTE — Patient Instructions (Signed)
You are doing well. No medication changes were made. Please call us if you have new issues that need to be addressed  Call for additional episodes of dizziness.

## 2010-10-15 ENCOUNTER — Other Ambulatory Visit: Payer: Self-pay | Admitting: Family Medicine

## 2010-10-15 DIAGNOSIS — R5383 Other fatigue: Secondary | ICD-10-CM

## 2010-10-15 DIAGNOSIS — Z125 Encounter for screening for malignant neoplasm of prostate: Secondary | ICD-10-CM

## 2010-10-15 DIAGNOSIS — E7800 Pure hypercholesterolemia, unspecified: Secondary | ICD-10-CM

## 2010-10-15 DIAGNOSIS — E749 Disorder of carbohydrate metabolism, unspecified: Secondary | ICD-10-CM

## 2010-11-02 ENCOUNTER — Other Ambulatory Visit (INDEPENDENT_AMBULATORY_CARE_PROVIDER_SITE_OTHER): Payer: Medicare Other

## 2010-11-02 DIAGNOSIS — E7800 Pure hypercholesterolemia, unspecified: Secondary | ICD-10-CM

## 2010-11-02 DIAGNOSIS — E749 Disorder of carbohydrate metabolism, unspecified: Secondary | ICD-10-CM

## 2010-11-02 DIAGNOSIS — R5381 Other malaise: Secondary | ICD-10-CM

## 2010-11-02 DIAGNOSIS — R5383 Other fatigue: Secondary | ICD-10-CM

## 2010-11-02 LAB — HEPATIC FUNCTION PANEL
AST: 24 U/L (ref 0–37)
Albumin: 4.1 g/dL (ref 3.5–5.2)
Alkaline Phosphatase: 71 U/L (ref 39–117)
Total Protein: 6.4 g/dL (ref 6.0–8.3)

## 2010-11-02 LAB — CBC WITH DIFFERENTIAL/PLATELET
Basophils Relative: 0.7 % (ref 0.0–3.0)
Eosinophils Absolute: 0.2 10*3/uL (ref 0.0–0.7)
Eosinophils Relative: 4.2 % (ref 0.0–5.0)
Hemoglobin: 14.6 g/dL (ref 13.0–17.0)
MCHC: 33.7 g/dL (ref 30.0–36.0)
MCV: 93.1 fl (ref 78.0–100.0)
Monocytes Absolute: 0.5 10*3/uL (ref 0.1–1.0)
Neutro Abs: 3.4 10*3/uL (ref 1.4–7.7)
RBC: 4.65 Mil/uL (ref 4.22–5.81)
WBC: 5.9 10*3/uL (ref 4.5–10.5)

## 2010-11-02 LAB — TSH: TSH: 1.38 u[IU]/mL (ref 0.35–5.50)

## 2010-11-02 LAB — RENAL FUNCTION PANEL
Albumin: 4.1 g/dL (ref 3.5–5.2)
BUN: 13 mg/dL (ref 6–23)
CO2: 28 mEq/L (ref 19–32)
Calcium: 8.9 mg/dL (ref 8.4–10.5)
Creatinine, Ser: 0.8 mg/dL (ref 0.4–1.5)
Glucose, Bld: 91 mg/dL (ref 70–99)

## 2010-11-02 LAB — LIPID PANEL
Cholesterol: 188 mg/dL (ref 0–200)
HDL: 75.2 mg/dL (ref >39.00)
LDL Cholesterol: 107 mg/dL — ABNORMAL HIGH (ref 0–99)
Triglycerides: 30 mg/dL (ref 0.0–149.0)

## 2010-11-08 NOTE — Progress Notes (Signed)
Patient ID: Bryan Avila, male    DOB: 03-21-1941, 70 y.o.   MRN: [id number]  HPI Comments: Bryan Avila is a very pleasant 70 year old gentleman here for transfer care from Dr Hetty Ely and for medicare annual wellness visit.  I saw him two month ago for dizziness, RBBB on EKG and referred him to Dr. Lewie Loron. EKG shows normal sinus rhythm with rate 62 beats per minute, right bundle branch block.  Has had no further symptoms.  Dr. Marylou Flesher notes reviewed.    I have personally reviewed the Medicare Annual Wellness questionnaire and have noted 1. The patient's medical and social history 2. Their use of alcohol, tobacco or illicit drugs 3. Their current medications and supplements 4. The patient's functional ability including ADL's, fall risks, home safety risks and hearing or visual             impairment. 5. Diet and physical activities 6. Evidence for depression or mood disorders   Review of Symptoms: See HPI  Constitutional: Negative.   HENT: Negative.   Eyes: Negative.   Respiratory: Negative.   Cardiovascular: Negative.   Gastrointestinal: Negative.   Musculoskeletal: Negative.   Skin: Negative.   Hematological: Negative.   Psychiatric/Behavioral: Negative.   All other systems reviewed and are negative.  Patient Active Problem List  Diagnoses  . HSV I, LEFT EYE  . DISORDER, CARBOHYDRATE METABOLISM NOS  . HYPERCHOLESTEROLEMIA, PURE  . HEARING LOSS, HIGH FREQUENCY  . ONYCHIA AND PARONYCHIA OF TOE  . OSTEOARTHRITIS  . Dizziness  . Fatigue  . Right bundle branch block  . Visit for preventive health examination   Past Medical History  Diagnosis Date  . Hyperlipidemia   . Osteoporosis     osteoarthritis  . Cataract 03/2009    left eye   Past Surgical History  Procedure Date  . Thumb surgery 1999    right   History  Substance Use Topics  . Smoking status: Never Smoker   . Smokeless tobacco: Not on file  . Alcohol Use: No   Family History  Problem Relation Age of  Onset  . Arthritis Father   . Sleep apnea Father   . Alcohol abuse Brother   . Cancer Paternal Grandfather     prostate  . Cancer Brother     cancer   No Known Allergies Current Outpatient Prescriptions on File Prior to Visit  Medication Sig Dispense Refill  . acyclovir (ZOVIRAX) 200 MG capsule Take 200 mg by mouth 2 (two) times daily.        . ASPIRIN LOW STRENGTH PO Take 1 tablet by mouth daily.        Marland Kitchen loteprednol (LOTEMAX) 0.2 % SUSP Use three times a week       . meclizine (ANTIVERT) 12.5 MG tablet Take 1 tablet (12.5 mg total) by mouth 3 (three) times daily as needed for dizziness or nausea.  30 tablet  1  . Multiple Vitamin (MULTIVITAMIN) tablet Take 1 tablet by mouth daily.        Marland Kitchen trifluridine (VIROPTIC) 1 % ophthalmic solution Use three times a week        The PMH, PSH, Social History, Family History, Medications, and allergies have been reviewed in [facility], and have been updated if relevant.   Physical Exam  BP 120/70  Pulse 70  Temp(Src) 98 F (36.7 C) (Oral)  Ht 5\' 8"  (1.727 m)  Wt 148 lb 8 oz (67.359 kg)  BMI 22.58 kg/m2  Constitutional: He is oriented  to person, place, and time. He appears well-developed and well-nourished.  HENT:  Head: Normocephalic.  Nose: Nose normal.  Mouth/Throat: Oropharynx is clear and moist.  Eyes: Conjunctivae are normal. Pupils are equal, round, and reactive to light.  Neck: Normal range of motion. Neck supple. No JVD present.  Cardiovascular: Normal rate, regular rhythm, S1 normal, S2 normal, normal heart sounds and intact distal pulses.  Exam reveals no gallop and no friction rub.   No murmur heard. Pulmonary/Chest: Effort normal and breath sounds normal. No respiratory distress. He has no wheezes. He has no rales. He exhibits no tenderness.  Abdominal: Soft. Bowel sounds are normal. He exhibits no distension. There is no tenderness.  Musculoskeletal: Normal range of motion. He exhibits no edema and no tenderness.  Lymphadenopathy:     He has no cervical adenopathy.  Neurological: He is alert and oriented to person, place, and time. Coordination normal.  Skin: Skin is warm and dry. No rash noted. No erythema.  Psychiatric: He has a normal mood and affect. His behavior is normal. Judgment and thought content normal.           Assessment and Plan    1. Routine general medical examination at a health care facility  The patients weight, height, BMI and visual acuity have been recorded in the chart I have made referrals, counseling and provided education to the patient based review of the above and I have provided the pt with a written personalized care plan for preventive services.  IFOB ordered today.

## 2010-11-09 ENCOUNTER — Ambulatory Visit (INDEPENDENT_AMBULATORY_CARE_PROVIDER_SITE_OTHER): Payer: Medicare Other | Admitting: Family Medicine

## 2010-11-09 ENCOUNTER — Encounter: Payer: Self-pay | Admitting: Family Medicine

## 2010-11-09 VITALS — BP 120/70 | HR 70 | Temp 98.0°F | Ht 68.0 in | Wt 148.5 lb

## 2010-11-09 DIAGNOSIS — R42 Dizziness and giddiness: Secondary | ICD-10-CM

## 2010-11-09 DIAGNOSIS — E7800 Pure hypercholesterolemia, unspecified: Secondary | ICD-10-CM

## 2010-11-09 DIAGNOSIS — I4510 Unspecified right bundle-branch block: Secondary | ICD-10-CM

## 2010-11-09 DIAGNOSIS — Z0000 Encounter for general adult medical examination without abnormal findings: Secondary | ICD-10-CM

## 2010-11-09 DIAGNOSIS — Z0110 Encounter for examination of ears and hearing without abnormal findings: Secondary | ICD-10-CM

## 2010-11-12 ENCOUNTER — Encounter: Payer: Self-pay | Admitting: *Deleted

## 2010-11-12 ENCOUNTER — Other Ambulatory Visit: Payer: Medicare Other

## 2010-11-12 ENCOUNTER — Other Ambulatory Visit: Payer: Self-pay | Admitting: Family Medicine

## 2010-11-12 DIAGNOSIS — Z1211 Encounter for screening for malignant neoplasm of colon: Secondary | ICD-10-CM

## 2011-03-17 ENCOUNTER — Ambulatory Visit (INDEPENDENT_AMBULATORY_CARE_PROVIDER_SITE_OTHER): Payer: Medicare Other | Admitting: Family Medicine

## 2011-03-17 ENCOUNTER — Encounter: Payer: Self-pay | Admitting: Family Medicine

## 2011-03-17 VITALS — BP 102/60 | HR 64 | Temp 98.6°F | Ht 68.0 in | Wt 154.8 lb

## 2011-03-17 DIAGNOSIS — J40 Bronchitis, not specified as acute or chronic: Secondary | ICD-10-CM

## 2011-03-17 DIAGNOSIS — J069 Acute upper respiratory infection, unspecified: Secondary | ICD-10-CM

## 2011-03-17 MED ORDER — HYDROCOD POLST-CHLORPHEN POLST 10-8 MG/5ML PO LQCR: 5.0000 mL | Freq: Two times a day (BID) | ORAL | Status: DC | PRN

## 2011-03-17 MED ORDER — AZITHROMYCIN 250 MG PO TABS: ORAL_TABLET | ORAL | Status: AC

## 2011-03-17 NOTE — Patient Instructions (Signed)
Take antibiotic as directed.  Drink lots of fluids.  Treat sympotmatically with Mucinex, nasal saline irrigation, and Tylenol/Ibuprofen.Cough suppressant at night. Call if not improving as expected in 5-7 days.    

## 2011-03-17 NOTE — Progress Notes (Signed)
SUBJECTIVE:  Bryan Avila is a 70 y.o. male who complains of coryza, congestion, sneezing, productive cough, myalgias and fever for 14 days. He denies a history of chest pain and wheezing and denies a history of asthma. Patient denies smoke cigarettes.   OBJECTIVE: BP 102/60  Pulse 64  Temp(Src) 98.6 F (37 C) (Oral)  Ht 5\' 8"  (1.727 m)  Wt 154 lb 12 oz (70.194 kg)  BMI 23.53 kg/m2  He appears well, vital signs are as noted. Ears normal.  Throat and pharynx normal.  Neck supple. No adenopathy in the neck. Nose is congested. Sinuses non tender. RLL rales, faint exp wheezes otherwise lungs clear, no increased WOB.  Patient Active Problem List  Diagnoses  . HSV I, LEFT EYE  . DISORDER, CARBOHYDRATE METABOLISM NOS  . HYPERCHOLESTEROLEMIA, PURE  . HEARING LOSS, HIGH FREQUENCY  . ONYCHIA AND PARONYCHIA OF TOE  . OSTEOARTHRITIS  . Dizziness  . Fatigue  . Right bundle branch block  . Visit for preventive health examination  . Routine general medical examination at a health care facility   Past Medical History  Diagnosis Date  . Hyperlipidemia   . Osteoporosis     osteoarthritis  . Cataract 03/2009    left eye   Past Surgical History  Procedure Date  . Thumb surgery 1999    right   History  Substance Use Topics  . Smoking status: Never Smoker   . Smokeless tobacco: Not on file  . Alcohol Use: No   Family History  Problem Relation Age of Onset  . Arthritis Father   . Sleep apnea Father   . Alcohol abuse Brother   . Cancer Paternal Grandfather     prostate  . Cancer Brother     cancer   No Known Allergies Current Outpatient Prescriptions on File Prior to Visit  Medication Sig Dispense Refill  . acyclovir (ZOVIRAX) 200 MG capsule Take 200 mg by mouth 2 (two) times daily.        . ASPIRIN LOW STRENGTH PO Take 1 tablet by mouth daily.        Marland Kitchen loteprednol (LOTEMAX) 0.2 % SUSP Use three times a week       . Multiple Vitamin (MULTIVITAMIN) tablet Take 1 tablet by mouth  daily.        Marland Kitchen trifluridine (VIROPTIC) 1 % ophthalmic solution Use three times a week         ASSESSMENT:  Bronchitis/PNA  PLAN: Zpack as directed.  Imaging not necessary at this time as it would not change management. The patient indicates understanding of these issues and agrees with the plan. Symptomatic therapy suggested: push fluids, rest and return office visit prn if symptoms persist or worsen.  Call or return to clinic prn if these symptoms worsen or fail to improve as anticipated.

## 2011-03-30 ENCOUNTER — Ambulatory Visit: Payer: Medicare Other | Admitting: Family Medicine

## 2011-11-05 ENCOUNTER — Other Ambulatory Visit: Payer: Self-pay | Admitting: Family Medicine

## 2011-11-05 DIAGNOSIS — E7800 Pure hypercholesterolemia, unspecified: Secondary | ICD-10-CM

## 2011-11-05 DIAGNOSIS — Z0000 Encounter for general adult medical examination without abnormal findings: Secondary | ICD-10-CM

## 2011-11-10 ENCOUNTER — Other Ambulatory Visit (INDEPENDENT_AMBULATORY_CARE_PROVIDER_SITE_OTHER): Payer: Medicare Other

## 2011-11-10 DIAGNOSIS — E7800 Pure hypercholesterolemia, unspecified: Secondary | ICD-10-CM

## 2011-11-10 DIAGNOSIS — Z0000 Encounter for general adult medical examination without abnormal findings: Secondary | ICD-10-CM

## 2011-11-10 LAB — COMPREHENSIVE METABOLIC PANEL
Albumin: 4 g/dL (ref 3.5–5.2)
Alkaline Phosphatase: 74 U/L (ref 39–117)
BUN: 17 mg/dL (ref 6–23)
CO2: 30 mEq/L (ref 19–32)
GFR: 87.34 mL/min (ref >60.00)
Glucose, Bld: 88 mg/dL (ref 70–99)
Potassium: 4.1 mEq/L (ref 3.5–5.1)
Sodium: 140 mEq/L (ref 135–145)
Total Protein: 7 g/dL (ref 6.0–8.3)

## 2011-11-10 LAB — LIPID PANEL
Cholesterol: 198 mg/dL (ref 0–200)
LDL Cholesterol: 113 mg/dL — ABNORMAL HIGH (ref 0–99)
Triglycerides: 54 mg/dL (ref 0.0–149.0)

## 2011-11-17 ENCOUNTER — Ambulatory Visit (INDEPENDENT_AMBULATORY_CARE_PROVIDER_SITE_OTHER): Payer: Medicare Other | Admitting: Family Medicine

## 2011-11-17 ENCOUNTER — Encounter: Payer: Self-pay | Admitting: Family Medicine

## 2011-11-17 VITALS — BP 110/78 | HR 72 | Temp 98.2°F | Ht 67.0 in | Wt 149.0 lb

## 2011-11-17 DIAGNOSIS — Z0000 Encounter for general adult medical examination without abnormal findings: Secondary | ICD-10-CM

## 2011-11-17 DIAGNOSIS — Z1211 Encounter for screening for malignant neoplasm of colon: Secondary | ICD-10-CM

## 2011-11-17 DIAGNOSIS — R3989 Other symptoms and signs involving the genitourinary system: Secondary | ICD-10-CM

## 2011-11-17 DIAGNOSIS — E7800 Pure hypercholesterolemia, unspecified: Secondary | ICD-10-CM

## 2011-11-17 DIAGNOSIS — H918X9 Other specified hearing loss, unspecified ear: Secondary | ICD-10-CM

## 2011-11-17 DIAGNOSIS — R399 Unspecified symptoms and signs involving the genitourinary system: Secondary | ICD-10-CM

## 2011-11-17 NOTE — Progress Notes (Signed)
Patient ID: Bryan Avila, male    DOB: 08-08-1940, 71 y.o.   MRN: [id number]  HPI Comments: Bryan Avila is a very pleasant 71 year old gentleman here  for medicare annual wellness visit.  I have personally reviewed the Medicare Annual Wellness questionnaire and have noted 1. The patient's medical and social history 2. Their use of alcohol, tobacco or illicit drugs 3. Their current medications and supplements 4. The patient's functional ability including ADL's, fall risks, home safety risks and hearing or visual             impairment. 5. Diet and physical activities 6. Evidence for depression or mood disorders   Review of Symptoms: See HPI  Constitutional: Negative.   HENT: Negative.   Eyes: Negative.   Respiratory: Negative.   Cardiovascular: Negative.   Gastrointestinal: Negative.   Musculoskeletal: Negative.   Skin: Negative.   Hematological: Negative.   Psychiatric/Behavioral: Negative.   All other systems reviewed and are negative.  Patient Active Problem List  Diagnosis  . HSV I, LEFT EYE  . DISORDER, CARBOHYDRATE METABOLISM NOS  . HYPERCHOLESTEROLEMIA, PURE  . HEARING LOSS, HIGH FREQUENCY  . ONYCHIA AND PARONYCHIA OF TOE  . OSTEOARTHRITIS  . Dizziness  . Fatigue  . Right bundle branch block  . Visit for preventive health examination  . Routine general medical examination at a health care facility   Past Medical History  Diagnosis Date  . Hyperlipidemia   . Osteoporosis     osteoarthritis  . Cataract 03/2009    left eye   Past Surgical History  Procedure Date  . Thumb surgery 1999    right   History  Substance Use Topics  . Smoking status: Never Smoker   . Smokeless tobacco: Not on file  . Alcohol Use: No   Family History  Problem Relation Age of Onset  . Arthritis Father   . Sleep apnea Father   . Alcohol abuse Brother   . Cancer Paternal Grandfather     prostate  . Cancer Brother     cancer   No Known Allergies Current Outpatient  Prescriptions on File Prior to Visit  Medication Sig Dispense Refill  . acyclovir (ZOVIRAX) 200 MG capsule Take 200 mg by mouth 2 (two) times daily.        . ASPIRIN LOW STRENGTH PO Take 1 tablet by mouth daily.        Marland Kitchen loteprednol (LOTEMAX) 0.2 % SUSP Use three times a week       . Multiple Vitamin (MULTIVITAMIN) tablet Take 1 tablet by mouth daily.        Marland Kitchen trifluridine (VIROPTIC) 1 % ophthalmic solution Use three times a week        History   Social History  . Marital Status: Married    Spouse Name: N/A    Number of Children: 1  . Years of Education: N/A   Occupational History  . 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 Smoking status: Never Smoker   . Smokeless tobacco: Not on file  . Alcohol Use: No  . Drug Use: No  . Sexually Active:    Other Topics Concern  . Not on file   Social History Narrative   Hobbies: Lexicographer ([place])Full code.Would not want Tube Feeds.Does not have a living will or HPOA.    The PMH, PSH, Social History, Family History, Medications, and allergies have been reviewed in [facility], and  have been updated if relevant.   Physical Exam  BP 110/78  Pulse 72  Temp 98.2 F (36.8 C)  Ht 5\' 7"  (1.702 m)  Wt 149 lb (67.586 kg)  BMI 23.34 kg/m2  Constitutional: He is oriented to person, place, and time. He appears well-developed and well-nourished.  HENT:  Head: Normocephalic.  Nose: Nose normal.  Mouth/Throat: Oropharynx is clear and moist.  Eyes: Conjunctivae are normal. Pupils are equal, round, and reactive to light.  Neck: Normal range of motion. Neck supple. No JVD present.  Cardiovascular: Normal rate, regular rhythm, S1 normal, S2 normal, normal heart sounds and intact distal pulses.  Exam reveals no gallop and no friction rub.   No murmur heard. Pulmonary/Chest: Effort normal and breath sounds normal. No respiratory distress. He has no wheezes. He has no  rales. He exhibits no tenderness.  Abdominal: Soft. Bowel sounds are normal. He exhibits no distension. There is no tenderness.  Musculoskeletal: Normal range of motion. He exhibits no edema and no tenderness.  Lymphadenopathy:    He has no cervical adenopathy.  Neurological: He is alert and oriented to person, place, and time. Coordination normal.  Skin: Skin is warm and dry. No rash noted. No erythema.  Psychiatric: He has a normal mood and affect. His behavior is normal. Judgment and thought content normal.           Assessment and Plan    1. Routine general medical examination at a health care facility  The patients weight, height, BMI and visual acuity have been recorded in the chart I have made referrals, counseling and provided education to the patient based review of the above and I have provided the pt with a written personalized care plan for preventive services.  IFOB ordered today.      2. HEARING LOSS, HIGH FREQUENCY     Discussed audiology referral- he would like to discuss with his wife.

## 2011-11-17 NOTE — Patient Instructions (Addendum)
Good to see you. Please talk to your wife about an audiology referral. We will call you with your PSA result and stool card results.

## 2011-11-23 ENCOUNTER — Other Ambulatory Visit: Payer: Medicare Other

## 2011-11-23 DIAGNOSIS — Z1211 Encounter for screening for malignant neoplasm of colon: Secondary | ICD-10-CM

## 2011-11-25 ENCOUNTER — Encounter: Payer: Self-pay | Admitting: *Deleted

## 2011-11-25 ENCOUNTER — Encounter: Payer: Self-pay | Admitting: Family Medicine

## 2011-12-14 ENCOUNTER — Telehealth: Payer: Self-pay

## 2011-12-14 NOTE — Telephone Encounter (Signed)
Left message asking wife to call back. 

## 2011-12-14 NOTE — Telephone Encounter (Signed)
I would need his jury duty letter.

## 2011-12-14 NOTE — Telephone Encounter (Signed)
pts wife called pt has difficulty in hearing(states documented in pts chart). Request letter to excuse from jury duty on 01/17/12.Please advise.

## 2011-12-14 NOTE — Telephone Encounter (Signed)
Advised patient's wife, she will bring in summons.

## 2011-12-16 ENCOUNTER — Telehealth: Payer: Self-pay | Admitting: *Deleted

## 2011-12-16 NOTE — Telephone Encounter (Signed)
Pt's wife has brought in a copy of pt's jury summons. She is requesting a letter excusing him from duty on 01/17/12.  Summons is on your desk.

## 2011-12-20 NOTE — Telephone Encounter (Signed)
Advised pt's wife.  She understood, told me to shred the copy of the summons that she had dropped off.

## 2011-12-20 NOTE — Telephone Encounter (Signed)
I am returning the letter to Bryan Avila. I have reviewed his medical history and unfortunately, he does not have any medical issue that would excuse him from [employer] (hard of hearing does not count since I suggested ENT referral for hearing aids).

## 2011-12-20 NOTE — Telephone Encounter (Signed)
Left message asking pt's wife to call back. 

## 2012-02-19 ENCOUNTER — Emergency Department (INDEPENDENT_AMBULATORY_CARE_PROVIDER_SITE_OTHER)
Admission: EM | Admit: 2012-02-19 | Discharge: 2012-02-19 | Disposition: A | Discharge status: Home / Self Care | Payer: Medicare Other | Source: Home / Self Care

## 2012-02-19 ENCOUNTER — Encounter (HOSPITAL_COMMUNITY): Payer: Self-pay | Admitting: Emergency Medicine

## 2012-02-19 DIAGNOSIS — T148XXA Other injury of unspecified body region, initial encounter: Secondary | ICD-10-CM

## 2012-02-19 DIAGNOSIS — IMO0002 Reserved for concepts with insufficient information to code with codable children: Secondary | ICD-10-CM

## 2012-02-19 NOTE — ED Notes (Addendum)
Pt c/o blister around naval area that he noticed today... Believes it's an insect bite.... Denies: fevers, vomiting, nausea, diarrhea, pain.

## 2012-02-24 ENCOUNTER — Encounter: Payer: Self-pay | Admitting: Family Medicine

## 2012-02-24 ENCOUNTER — Ambulatory Visit (INDEPENDENT_AMBULATORY_CARE_PROVIDER_SITE_OTHER)
Admission: RE | Admit: 2012-02-24 | Discharge: 2012-02-24 | Disposition: A | Discharge status: Home / Self Care | Payer: Medicare Other | Source: Ambulatory Visit | Attending: Family Medicine | Admitting: Family Medicine

## 2012-02-24 ENCOUNTER — Ambulatory Visit (INDEPENDENT_AMBULATORY_CARE_PROVIDER_SITE_OTHER): Payer: Medicare Other | Admitting: Family Medicine

## 2012-02-24 VITALS — BP 108/80 | Temp 97.6°F | Wt 151.0 lb

## 2012-02-24 DIAGNOSIS — S6990XA Unspecified injury of unspecified wrist, hand and finger(s), initial encounter: Secondary | ICD-10-CM

## 2012-02-24 DIAGNOSIS — S6992XA Unspecified injury of left wrist, hand and finger(s), initial encounter: Secondary | ICD-10-CM

## 2012-02-24 NOTE — Patient Instructions (Addendum)
Good to see you. We are getting an xray of your hand.  Please continue to ice the area.

## 2012-02-24 NOTE — Progress Notes (Signed)
Very pleasant 71 yo male here for left hand injury.  Was going to flick a bug off of his grand daughter's chair and immediately felt something pop at the top of his hand. He felt like his joint was out of place- "pushed his knuckle" back in place.  Since then, hand is swollen.  Hurts to make a first.  No tingling into his digits.  No weakness of his grip strength but does cause pain.  Patient Active Problem List  Diagnosis  . HSV I, LEFT EYE  . DISORDER, CARBOHYDRATE METABOLISM NOS  . HYPERCHOLESTEROLEMIA, PURE  . HEARING LOSS, HIGH FREQUENCY  . ONYCHIA AND PARONYCHIA OF TOE  . OSTEOARTHRITIS  . Dizziness  . Fatigue  . Right bundle branch block  . Visit for preventive health examination  . Routine general medical examination at a health care facility   Past Medical History  Diagnosis Date  . Hyperlipidemia   . Osteoporosis     osteoarthritis  . Cataract 03/2009    left eye   Past Surgical History  Procedure Date  . Thumb surgery 1999    right  . Eye surgery    History  Substance Use Topics  . Smoking status: Never Smoker   . Smokeless tobacco: Not on file  . Alcohol Use: No   Family History  Problem Relation Age of Onset  . Arthritis Father   . Sleep apnea Father   . Alcohol abuse Brother   . Cancer Paternal Grandfather     prostate  . Cancer Brother     cancer   Allergies  Allergen Reactions  . Demerol (Meperidine)    Current Outpatient Prescriptions on File Prior to Visit  Medication Sig Dispense Refill  . acyclovir (ZOVIRAX) 200 MG capsule Take 200 mg by mouth 2 (two) times daily.        . ASPIRIN LOW STRENGTH PO Take 1 tablet by mouth daily.        Marland Kitchen loteprednol (LOTEMAX) 0.2 % SUSP Use three times a week       . Multiple Vitamin (MULTIVITAMIN) tablet Take 1 tablet by mouth daily.        Marland Kitchen trifluridine (VIROPTIC) 1 % ophthalmic solution Use three times a week        The PMH, PSH, Social History, Family History, Medications, and allergies have been  reviewed in [facility], and have been updated if relevant.  ROS: See HPI  Physical exam: BP 108/80  Temp 97.6 F (36.4 C)  Wt 151 lb (68.493 kg) Gen:  Alert, pleasant male, NAD MSK: Left hand obviously swollen TTP over 3rd MCP, able to grip my hand normally but with pain.  Assessment and Plan: 1. Injury of left hand  DG Hand Complete Left   New- MCP dislocation- now in tact, good grip strength, pain mild and no vascular or nerve issues apparent on exam or history. Will check xray to make sure no avulsions or other fractures. Continue to ice hand.  If symptoms do not improve over next day or so, will refer to hand surgeon. The patient indicates understanding of these issues and agrees with the plan.

## 2012-11-13 ENCOUNTER — Other Ambulatory Visit: Payer: Self-pay | Admitting: Family Medicine

## 2012-11-13 ENCOUNTER — Other Ambulatory Visit (INDEPENDENT_AMBULATORY_CARE_PROVIDER_SITE_OTHER): Payer: Medicare Other

## 2012-11-13 DIAGNOSIS — Z0000 Encounter for general adult medical examination without abnormal findings: Secondary | ICD-10-CM

## 2012-11-13 DIAGNOSIS — Z125 Encounter for screening for malignant neoplasm of prostate: Secondary | ICD-10-CM

## 2012-11-13 DIAGNOSIS — E7800 Pure hypercholesterolemia, unspecified: Secondary | ICD-10-CM

## 2012-11-13 LAB — COMPREHENSIVE METABOLIC PANEL
ALT: 15 U/L (ref 0–53)
AST: 18 U/L (ref 0–37)
Alkaline Phosphatase: 73 U/L (ref 39–117)
BUN: 14 mg/dL (ref 6–23)
Calcium: 9.7 mg/dL (ref 8.4–10.5)
Chloride: 105 mEq/L (ref 96–112)
Creatinine, Ser: 0.9 mg/dL (ref 0.4–1.5)
Potassium: 4.2 mEq/L (ref 3.5–5.1)

## 2012-11-13 LAB — LIPID PANEL
Cholesterol: 194 mg/dL (ref 0–200)
HDL: 75.1 mg/dL (ref >39.00)
LDL Cholesterol: 108 mg/dL — ABNORMAL HIGH (ref 0–99)
Total CHOL/HDL Ratio: 3
Triglycerides: 53 mg/dL (ref 0.0–149.0)
VLDL: 10.6 mg/dL (ref 0.0–40.0)

## 2012-11-16 ENCOUNTER — Ambulatory Visit (INDEPENDENT_AMBULATORY_CARE_PROVIDER_SITE_OTHER): Payer: Medicare Other | Admitting: Family Medicine

## 2012-11-16 ENCOUNTER — Encounter: Payer: Self-pay | Admitting: Family Medicine

## 2012-11-16 VITALS — BP 118/72 | HR 68 | Temp 97.8°F | Ht 66.75 in | Wt 147.0 lb

## 2012-11-16 DIAGNOSIS — Z1331 Encounter for screening for depression: Secondary | ICD-10-CM

## 2012-11-16 DIAGNOSIS — H6120 Impacted cerumen, unspecified ear: Secondary | ICD-10-CM | POA: Insufficient documentation

## 2012-11-16 DIAGNOSIS — H6123 Impacted cerumen, bilateral: Secondary | ICD-10-CM

## 2012-11-16 DIAGNOSIS — M1990 Unspecified osteoarthritis, unspecified site: Secondary | ICD-10-CM

## 2012-11-16 DIAGNOSIS — E7800 Pure hypercholesterolemia, unspecified: Secondary | ICD-10-CM

## 2012-11-16 DIAGNOSIS — Z0000 Encounter for general adult medical examination without abnormal findings: Secondary | ICD-10-CM

## 2012-11-16 NOTE — Progress Notes (Addendum)
Patient ID: Bryan Avila, male    DOB: [birth year], 72 y.o.   MRN: [id number]  HPI Comments: Bryan Avila is a very pleasant 72 year old gentleman here  for medicare annual wellness visit.  I have personally reviewed the Medicare Annual Wellness questionnaire and have noted 1. The patient's medical and social history 2. Their use of alcohol, tobacco or illicit drugs 3. Their current medications and supplements 4. The patient's functional ability including ADL's, fall risks, home safety risks and hearing or visual             impairment. 5. Diet and physical activities 6. Evidence for depression or mood disorders  End of life wishes discussed and updated in Social History.  Doing well.  Walks every day. Feels great.  Has not complaints.  Has had hearing loss for years but feels he cannot afford hearing aids.  Lab Results  Component Value Date   CHOL 194 11/13/2012   HDL 75.10 11/13/2012   LDLCALC 108* 11/13/2012   TRIG 53.0 11/13/2012   CHOLHDL 3 11/13/2012   Lab Results  Component Value Date   PSA 0.76 11/13/2012   PSA 0.80 11/17/2011   PSA 0.65 10/30/2009    Patient Active Problem List   Diagnosis Date Noted  . Routine general medical examination at a health care facility 11/09/2010    Priority: High  . Visit for preventive health examination 09/16/2010  . Right bundle branch block 09/10/2010  . 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 HYPERCHOLESTEROLEMIA, PURE 10/26/2006  . HEARING LOSS, HIGH FREQUENCY 10/26/2006  . OSTEOARTHRITIS 10/26/2006   Past Medical History  Diagnosis Date  . Hyperlipidemia   . Osteoporosis     osteoarthritis  . Cataract 03/2009    left eye   Past Surgical History  Procedure Laterality Date  . Thumb surgery  1999    right  . Eye surgery     History  Substance Use Topics  . Smoking status: Never Smoker   . Smokeless tobacco: Not on file  . Alcohol Use: No   Family History  Problem Relation Age of Onset  . Arthritis Father   . Sleep  apnea Father   . Alcohol abuse Brother   . Cancer Paternal Grandfather     prostate  . Cancer Brother     cancer   Allergies  Allergen Reactions  . Demerol (Meperidine)    Current Outpatient Prescriptions on File Prior to Visit  Medication Sig Dispense Refill  . acyclovir (ZOVIRAX) 200 MG capsule Take 200 mg by mouth 2 (two) times daily.        . ASPIRIN LOW STRENGTH PO Take 1 tablet by mouth daily.        Marland Kitchen loteprednol (LOTEMAX) 0.2 % SUSP Use three times a week       . Multiple Vitamin (MULTIVITAMIN) tablet Take 1 tablet by mouth daily.        Marland Kitchen trifluridine (VIROPTIC) 1 % ophthalmic solution Use three times a week        No current facility-administered medications on file prior to visit.   The PMH, PSH, Social History, Family History, Medications, and allergies have been reviewed in [facility], and have been updated if relevant.   Review of Symptoms: See HPI  Constitutional: Negative.   HENT: Negative.   Eyes: Negative.   Respiratory: Negative.   Cardiovascular: Negative.   Gastrointestinal: Negative.   Musculoskeletal: Negative.   Skin: Negative.   Hematological: Negative.   Psychiatric/Behavioral: Negative.  All other systems reviewed and are negative.   Physical Exam  BP 118/72  Pulse 68  Temp(Src) 97.8 F (36.6 C)  Ht 5' 6.75" (1.695 m)  Wt 147 lb (66.679 kg)  BMI 23.21 kg/m2  Constitutional: He is oriented to person, place, and time. He appears well-developed and well-nourished.  HENT: cerumen impaction bilaterally Head: Normocephalic.  Nose: Nose normal.  Mouth/Throat: Oropharynx is clear and moist.  Eyes: Conjunctivae are normal. Pupils are equal, round, and reactive to light.  Neck: Normal range of motion. Neck supple. No JVD present.  Cardiovascular: Normal rate, regular rhythm, S1 normal, S2 normal, normal heart sounds and intact distal pulses.  Exam reveals no gallop and no friction rub.   No murmur heard. Pulmonary/Chest: Effort normal and breath  sounds normal. No respiratory distress. He has no wheezes. He has no rales. He exhibits no tenderness.  Abdominal: Soft. Bowel sounds are normal. He exhibits no distension. There is no tenderness.  Musculoskeletal: Normal range of motion. He exhibits no edema and no tenderness.  Lymphadenopathy:    He has no cervical adenopathy.  Neurological: He is alert and oriented to person, place, and time. Coordination normal.  Skin: Skin is warm and dry. No rash noted. No erythema.  Psychiatric: He has a normal mood and affect. His behavior is normal. Judgment and thought content normal.           Assessment and Plan  1. Routine general medical examination at a health care facility The patients weight, height, BMI and visual acuity have been recorded in the chart I have made referrals, counseling and provided education to the patient based review of the above and I have provided the pt with a written personalized care plan for preventive services.   2. OSTEOARTHRITIS Symptoms controlled.  3. HYPERCHOLESTEROLEMIA, PURE Diet controlled.  4. Cerumen impaction, bilateral Ceruminosis is noted.  Attempted wax removal by syringing and manual debridement but he could not tolerate procedure. Instructions for home care to prevent wax buildup are given.

## 2012-11-16 NOTE — Patient Instructions (Addendum)
Good to see you, Bryan Avila. Have a wonderful summer.  You can also try debrox ear drops over the counter for wax build up.

## 2012-12-29 ENCOUNTER — Encounter: Payer: Self-pay | Admitting: Family Medicine

## 2012-12-29 ENCOUNTER — Ambulatory Visit (INDEPENDENT_AMBULATORY_CARE_PROVIDER_SITE_OTHER): Payer: Medicare Other | Admitting: Family Medicine

## 2012-12-29 VITALS — BP 106/64 | HR 80 | Temp 98.3°F | Ht 66.75 in | Wt 150.0 lb

## 2012-12-29 DIAGNOSIS — J069 Acute upper respiratory infection, unspecified: Secondary | ICD-10-CM

## 2012-12-29 NOTE — Progress Notes (Signed)
SUBJECTIVE:  Bryan Avila is a 72 y.o. male who complains of coryza, congestion, sneezing, sore throat and dry cough for 14 days, symptoms have been improving over last few days. He denies a history of anorexia, chest pain, chills, fevers, myalgias, nausea, shortness of breath, sweats and vomiting and denies a history of asthma. Patient denies smoke cigarettes.   Patient Active Problem List   Diagnosis Date Noted  . Right bundle branch block 09/10/2010  . 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 HYPERCHOLESTEROLEMIA, PURE 10/26/2006  . HEARING LOSS, HIGH FREQUENCY 10/26/2006  . OSTEOARTHRITIS 10/26/2006   Past Medical History  Diagnosis Date  . Hyperlipidemia   . Osteoporosis     osteoarthritis  . Cataract 03/2009    left eye   Past Surgical History  Procedure Laterality Date  . Thumb surgery  1999    right  . Eye surgery     History  Substance Use Topics  . Smoking status: Never Smoker   . Smokeless tobacco: Not on file  . Alcohol Use: No   Family History  Problem Relation Age of Onset  . Arthritis Father   . Sleep apnea Father   . Alcohol abuse Brother   . Cancer Paternal Grandfather     prostate  . Cancer Brother     cancer   Allergies  Allergen Reactions  . Demerol [Meperidine]    Current Outpatient Prescriptions on File Prior to Visit  Medication Sig Dispense Refill  . acyclovir (ZOVIRAX) 200 MG capsule Take 200 mg by mouth 2 (two) times daily.        . ASPIRIN LOW STRENGTH PO Take one by mouth three times a week      . loteprednol (LOTEMAX) 0.2 % SUSP Use three times a week       . Multiple Vitamin (MULTIVITAMIN) tablet Take 1 tablet by mouth daily.        Marland Kitchen trifluridine (VIROPTIC) 1 % ophthalmic solution Use three times a week        No current facility-administered medications on file prior to visit.   The PMH, PSH, Social History, Family History, Medications, and allergies have been reviewed in [facility], and have been updated if  relevant.  OBJECTIVE: BP 106/64  Pulse 80  Temp(Src) 98.3 F (36.8 C)  Ht 5' 6.75" (1.695 m)  Wt 150 lb (68.04 kg)  BMI 23.68 kg/m2  He appears well, vital signs are as noted. Ears normal.  Throat and pharynx normal.  Neck supple. No adenopathy in the neck. Nose is congested. Sinuses non tender. The chest is clear, without wheezes or rales.  ASSESSMENT:  viral upper respiratory illness  PLAN: Symptomatic therapy suggested: push fluids, rest and return office visit prn if symptoms persist or worsen. Lack of antibiotic effectiveness discussed with him. Call or return to clinic prn if these symptoms worsen or fail to improve as anticipated.

## 2013-07-23 ENCOUNTER — Encounter: Payer: Self-pay | Admitting: Family Medicine

## 2013-07-23 ENCOUNTER — Ambulatory Visit (INDEPENDENT_AMBULATORY_CARE_PROVIDER_SITE_OTHER): Payer: Medicare Other | Admitting: Family Medicine

## 2013-07-23 VITALS — BP 116/70 | HR 66 | Temp 97.8°F | Wt 149.8 lb

## 2013-07-23 DIAGNOSIS — J209 Acute bronchitis, unspecified: Secondary | ICD-10-CM | POA: Insufficient documentation

## 2013-07-23 MED ORDER — HYDROCOD POLST-CHLORPHEN POLST 10-8 MG/5ML PO LQCR: 5.0000 mL | Freq: Two times a day (BID) | ORAL | Status: DC | PRN

## 2013-07-23 NOTE — Progress Notes (Signed)
Pre visit review using our clinic review tool, if applicable. No additional management support is needed unless otherwise documented below in the visit note. 

## 2013-07-23 NOTE — Progress Notes (Signed)
   BP 116/70  Pulse 66  Temp(Src) 97.8 F (36.6 C) (Oral)  Wt 149 lb 12.8 oz (67.949 kg)  SpO2 97%   CC: URI  Subjective:    Patient ID: Bryan Avila, male    DOB: [birth year], 73 y.o.   MRN: [id number]  HPI: Bryan Avila is a 73 y.o. male presenting on 07/23/2013 for URI   3d h/o ST, progressing to rhinorrhea, fever to 100.9, deep hoarse dry cough.  Today feeling some better.  Mild frontal pressure.  Some pain with cough.  No abd pain, nausea, ear or tooth pain, headaches, PNdrainage.    Taking mucinex bid for this.  Has been doing salt water gargles  No sick contacts at home. No smokers at home. No h/o asthma. Did receive flu shot this year.  Relevant past medical, surgical, family and social history reviewed and updated as indicated.  Allergies and medications reviewed and updated. Current Outpatient Prescriptions on File Prior to Visit  Medication Sig  . acyclovir (ZOVIRAX) 200 MG capsule Take 200 mg by mouth 2 (two) times daily.    . ASPIRIN LOW STRENGTH PO Take one by mouth three times a week  . dorzolamide-timolol (COSOPT) 22.3-6.8 MG/ML ophthalmic solution 1 drop daily.  Marland Kitchen loteprednol (LOTEMAX) 0.2 % SUSP Use three times a week   . Multiple Vitamins-Minerals (EQL PROTECTAVISION PO) Take by mouth.  . trifluridine (VIROPTIC) 1 % ophthalmic solution Use three times a week    No current facility-administered medications on file prior to visit.    Review of Systems Per HPI unless specifically indicated above    Objective:    BP 116/70  Pulse 66  Temp(Src) 97.8 F (36.6 C) (Oral)  Wt 149 lb 12.8 oz (67.949 kg)  SpO2 97%  Physical Exam  Nursing note and vitals reviewed. Constitutional: He appears well-developed and well-nourished. No distress.  HENT:  Head: Normocephalic and atraumatic.  Right Ear: Hearing, tympanic membrane, external ear and ear canal normal.  Left Ear: Hearing, tympanic membrane, external ear and ear canal normal.  Nose: Mucosal edema  present. No rhinorrhea. Right sinus exhibits no maxillary sinus tenderness and no frontal sinus tenderness. Left sinus exhibits no maxillary sinus tenderness and no frontal sinus tenderness.  Mouth/Throat: Uvula is midline and mucous membranes are normal. Oropharyngeal exudate (mild) present. No posterior oropharyngeal edema, posterior oropharyngeal erythema or tonsillar abscesses.  Eyes: Conjunctivae and EOM are normal. Pupils are equal, round, and reactive to light. No scleral icterus.  Neck: Normal range of motion. Neck supple.  Cardiovascular: Normal rate, regular rhythm, normal heart sounds and intact distal pulses.   No murmur heard. Pulmonary/Chest: Effort normal and breath sounds normal. No respiratory distress. He has no wheezes. He has no rales.  Lymphadenopathy:    He has no cervical adenopathy.  Skin: Skin is warm and dry. No rash noted.       Assessment & Plan:   Problem List Items Addressed This Visit   Acute bronchitis - Primary     Anticipate viral given short duration. See pt instructions for supportive care. Red flags to return discussed. tussionex for cough at night time given he endorses night time awakenings due to cough.        Follow up plan: Return if symptoms worsen or fail to improve.

## 2013-07-23 NOTE — Assessment & Plan Note (Addendum)
Anticipate viral given short duration. See pt instructions for supportive care. Red flags to return discussed. tussionex for cough at night time given he endorses night time awakenings due to cough.

## 2013-07-23 NOTE — Patient Instructions (Signed)
I think you have bronchitis, but likely viral at this point. Push fluids and rest. mucinex during the day, tussionex cough syrup at night time. Salt water gargles.  May use ibuprofen for throat and lung inflammation. Watch for fever >101, worsening productive cough, or any worsening instead of daily improvement.  If this happens, call me to let me know and we may plcae you on antibiotic.

## 2013-11-19 ENCOUNTER — Telehealth: Payer: Self-pay | Admitting: Family Medicine

## 2013-11-19 ENCOUNTER — Encounter: Payer: Self-pay | Admitting: Family Medicine

## 2013-11-19 ENCOUNTER — Ambulatory Visit (INDEPENDENT_AMBULATORY_CARE_PROVIDER_SITE_OTHER): Payer: Medicare Other | Admitting: Family Medicine

## 2013-11-19 VITALS — BP 124/82 | HR 62 | Temp 98.5°F | Ht 66.5 in | Wt 144.5 lb

## 2013-11-19 DIAGNOSIS — Z1211 Encounter for screening for malignant neoplasm of colon: Secondary | ICD-10-CM

## 2013-11-19 DIAGNOSIS — M1990 Unspecified osteoarthritis, unspecified site: Secondary | ICD-10-CM

## 2013-11-19 DIAGNOSIS — Z23 Encounter for immunization: Secondary | ICD-10-CM

## 2013-11-19 DIAGNOSIS — E7800 Pure hypercholesterolemia, unspecified: Secondary | ICD-10-CM

## 2013-11-19 DIAGNOSIS — Z0000 Encounter for general adult medical examination without abnormal findings: Secondary | ICD-10-CM | POA: Insufficient documentation

## 2013-11-19 DIAGNOSIS — H918X9 Other specified hearing loss, unspecified ear: Secondary | ICD-10-CM

## 2013-11-19 DIAGNOSIS — Z125 Encounter for screening for malignant neoplasm of prostate: Secondary | ICD-10-CM

## 2013-11-19 LAB — COMPREHENSIVE METABOLIC PANEL
ALBUMIN: 3.9 g/dL (ref 3.5–5.2)
ALK PHOS: 76 U/L (ref 39–117)
ALT: 13 U/L (ref 0–53)
AST: 19 U/L (ref 0–37)
BILIRUBIN TOTAL: 0.7 mg/dL (ref 0.2–1.2)
BUN: 15 mg/dL (ref 6–23)
CO2: 28 mEq/L (ref 19–32)
Calcium: 9.3 mg/dL (ref 8.4–10.5)
Chloride: 106 mEq/L (ref 96–112)
Creatinine, Ser: 0.7 mg/dL (ref 0.4–1.5)
GFR: 110.24 mL/min (ref >60.00)
GLUCOSE: 92 mg/dL (ref 70–99)
Potassium: 4.5 mEq/L (ref 3.5–5.1)
Sodium: 141 mEq/L (ref 135–145)
Total Protein: 6.5 g/dL (ref 6.0–8.3)

## 2013-11-19 LAB — CBC WITH DIFFERENTIAL/PLATELET
BASOS PCT: 0.5 % (ref 0.0–3.0)
Basophils Absolute: 0 10*3/uL (ref 0.0–0.1)
EOS PCT: 3.6 % (ref 0.0–5.0)
Eosinophils Absolute: 0.2 10*3/uL (ref 0.0–0.7)
HEMATOCRIT: 44.8 % (ref 39.0–52.0)
HEMOGLOBIN: 14.8 g/dL (ref 13.0–17.0)
LYMPHS ABS: 1.3 10*3/uL (ref 0.7–4.0)
Lymphocytes Relative: 19 % (ref 12.0–46.0)
MCHC: 33 g/dL (ref 30.0–36.0)
MCV: 91.5 fl (ref 78.0–100.0)
MONO ABS: 0.6 10*3/uL (ref 0.1–1.0)
Monocytes Relative: 9.3 % (ref 3.0–12.0)
Neutro Abs: 4.5 10*3/uL (ref 1.4–7.7)
Neutrophils Relative %: 67.6 % (ref 43.0–77.0)
Platelets: 237 10*3/uL (ref 150.0–400.0)
RBC: 4.9 Mil/uL (ref 4.22–5.81)
RDW: 15.1 % (ref 11.5–15.5)
WBC: 6.6 10*3/uL (ref 4.0–10.5)

## 2013-11-19 LAB — LIPID PANEL
CHOLESTEROL: 197 mg/dL (ref 0–200)
HDL: 78.1 mg/dL (ref >39.00)
LDL CALC: 109 mg/dL — AB (ref 0–99)
NonHDL: 118.9
TRIGLYCERIDES: 51 mg/dL (ref 0.0–149.0)
Total CHOL/HDL Ratio: 3
VLDL: 10.2 mg/dL (ref 0.0–40.0)

## 2013-11-19 NOTE — Assessment & Plan Note (Signed)
The patients weight, height, BMI and visual acuity have been recorded in the chart I have made referrals, counseling and provided education to the patient based review of the above and I have provided the pt with a written personalized care plan for preventive services.  Prevnar 13 today. Orders Placed This Encounter  Procedures  . Fecal occult blood, imunochemical  . CBC with Differential  . Comprehensive metabolic panel  . Lipid panel  . PSA, Medicare

## 2013-11-19 NOTE — Progress Notes (Signed)
Patient ID: Bryan Avila, male    DOB: 1940/08/25, 73 y.o.   MRN: [id number]  HPI Comments: Bryan Avila is a very pleasant 73 year old gentleman here  for medicare annual wellness visit.  I have personally reviewed the Medicare Annual Wellness questionnaire and have noted 1. The patient's medical and social history 2. Their use of alcohol, tobacco or illicit drugs 3. Their current medications and supplements 4. The patient's functional ability including ADL's, fall risks, home safety risks and hearing or visual             impairment. 5. Diet and physical activities 6. Evidence for depression or mood disorders  End of life wishes discussed and updated in Social History.  Doing well.  Walks every day. Feels great.  Has not complaints.  The roster of all physicians providing medical care to patient - is listed in the Snapshot section of the chart.  Pneumovax 03/31/07 Td 10/28/06 Colonoscopy 01/06/08 (Dr. Collene Mares)- 10 year recall Zoster 03/2010  Has had hearing loss for years but feels he cannot afford hearing aids.  Saw dermatologist in 09/2013 (Dr. Delman Cheadle)- small "skin cancer" taken off, no other issues.  Sees optho twice a year.  Lab Results  Component Value Date   CHOL 194 11/13/2012   HDL 75.10 11/13/2012   LDLCALC 108* 11/13/2012   TRIG 53.0 11/13/2012   CHOLHDL 3 11/13/2012   Lab Results  Component Value Date   PSA 0.76 11/13/2012   PSA 0.80 11/17/2011   PSA 0.65 10/30/2009    Patient Active Problem List   Diagnosis Date Noted  . Medicare annual wellness visit, subsequent 11/19/2013  . Right bundle branch block 09/10/2010  . 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 HYPERCHOLESTEROLEMIA, PURE 10/26/2006  . HEARING LOSS, HIGH FREQUENCY 10/26/2006  . OSTEOARTHRITIS 10/26/2006   Past Medical History  Diagnosis Date  . Hyperlipidemia   . Osteoporosis     osteoarthritis  . Cataract 03/2009    left eye   Past Surgical History  Procedure Laterality Date  . Thumb surgery   1999    right  . Eye surgery     History  Substance Use Topics  . Smoking status: Never Smoker   . Smokeless tobacco: Never Used  . Alcohol Use: No   Family History  Problem Relation Age of Onset  . Arthritis Father   . Sleep apnea Father   . Alcohol abuse Brother   . Cancer Paternal Grandfather     prostate  . Cancer Brother     cancer   Allergies  Allergen Reactions  . Demerol [Meperidine]    Current Outpatient Prescriptions on File Prior to Visit  Medication Sig Dispense Refill  . acyclovir (ZOVIRAX) 200 MG capsule Take 200 mg by mouth 2 (two) times daily.        . ASPIRIN LOW STRENGTH PO Take one by mouth three times a week      . dorzolamide-timolol (COSOPT) 22.3-6.8 MG/ML ophthalmic solution Place 1 drop into the right eye daily.       Marland Kitchen loteprednol (LOTEMAX) 0.2 % SUSP Use three times a week       . trifluridine (VIROPTIC) 1 % ophthalmic solution Use three times a week       . chlorpheniramine-HYDROcodone (TUSSIONEX) 10-8 MG/5ML LQCR Take 5 mLs by mouth every 12 (twelve) hours as needed.  140 mL  0  . Multiple Vitamins-Minerals (EQL PROTECTAVISION PO) Take by mouth.       No current  facility-administered medications on file prior to visit.   The PMH, PSH, Social History, Family History, Medications, and allergies have been reviewed in [facility], and have been updated if relevant.   Review of Symptoms: See HPI  Constitutional: Negative.   HENT: Negative.   Eyes: Negative.   Respiratory: Negative.   Cardiovascular: Negative.   Gastrointestinal: Negative.   Musculoskeletal: Negative.   Skin: Negative.   Hematological: Negative.   Psychiatric/Behavioral: Negative.   All other systems reviewed and are negative.   Physical Exam  BP 124/82  Pulse 62  Temp(Src) 98.5 F (36.9 C) (Oral)  Ht 5' 6.5" (1.689 m)  Wt 144 lb 8 oz (65.545 kg)  BMI 22.98 kg/m2  Constitutional: He is oriented to person, place, and time. He appears well-developed and well-nourished.   Hard of hearing- baseline HENT: TMs clear bilaterally Head: Normocephalic.  Nose: Nose normal.  Mouth/Throat: Oropharynx is clear and moist.  Eyes: Conjunctivae are normal. Pupils are equal, round, and reactive to light.  Neck: Normal range of motion. Neck supple. No JVD present.  Cardiovascular: Normal rate, regular rhythm, S1 normal, S2 normal, normal heart sounds and intact distal pulses.  Exam reveals no gallop and no friction rub.   No murmur heard. Pulmonary/Chest: Effort normal and breath sounds normal. No respiratory distress. He has no wheezes. He has no rales. He exhibits no tenderness.  Abdominal: Soft. Bowel sounds are normal. He exhibits no distension. There is no tenderness.  Musculoskeletal: Normal range of motion. He exhibits no edema and no tenderness.  Lymphadenopathy:    He has no cervical adenopathy.  Neurological: He is alert and oriented to person, place, and time. Coordination normal.  Skin: Skin is warm and dry. No rash noted. No erythema.  Psychiatric: He has a normal mood and affect. His behavior is normal. Judgment and thought content normal.

## 2013-11-19 NOTE — Telephone Encounter (Signed)
I apologize.  I'm not sure how I accidentally entered his name.  I typed in [place].  Please apologize for me and I will take care of it.

## 2013-11-19 NOTE — Progress Notes (Signed)
Pre visit review using our clinic review tool, if applicable. No additional management support is needed unless otherwise documented below in the visit note. 

## 2013-11-19 NOTE — Addendum Note (Signed)
Addended by: Emelia Salisbury C on: 11/19/2013 08:59 AM   Modules accepted: Orders

## 2013-11-19 NOTE — Assessment & Plan Note (Signed)
Stable- does not want to pursue hearing aids further.

## 2013-11-19 NOTE — Telephone Encounter (Signed)
Pt wife called today requesting that Dr. Vevelyn Royals, MD be taken off of patients Care Team. They do not know who this doc is. Pt's eye doctor is Dr. Luberta Mutter at [facility]. Their phone # is [phone]. Please advise!

## 2013-11-19 NOTE — Telephone Encounter (Signed)
Patient's wife notified as instructed by telephone.

## 2013-11-19 NOTE — Patient Instructions (Signed)
Great to see you. We will call you with your lab results and you can view them online.  

## 2013-11-19 NOTE — Assessment & Plan Note (Signed)
Check labs today.

## 2013-11-20 ENCOUNTER — Encounter: Payer: Self-pay | Admitting: *Deleted

## 2013-11-20 LAB — PSA, MEDICARE: PSA: 0.63 ng/mL (ref 0.10–4.00)

## 2013-11-26 ENCOUNTER — Other Ambulatory Visit (INDEPENDENT_AMBULATORY_CARE_PROVIDER_SITE_OTHER): Payer: Medicare Other

## 2013-11-26 ENCOUNTER — Encounter: Payer: Self-pay | Admitting: *Deleted

## 2013-11-26 DIAGNOSIS — Z1211 Encounter for screening for malignant neoplasm of colon: Secondary | ICD-10-CM

## 2013-11-26 LAB — FECAL OCCULT BLOOD, IMMUNOCHEMICAL: Fecal Occult Bld: NEGATIVE

## 2014-02-19 ENCOUNTER — Encounter: Payer: Self-pay | Admitting: Family Medicine

## 2014-05-13 ENCOUNTER — Ambulatory Visit (INDEPENDENT_AMBULATORY_CARE_PROVIDER_SITE_OTHER): Payer: Medicare Other | Admitting: Internal Medicine

## 2014-05-13 ENCOUNTER — Encounter: Payer: Self-pay | Admitting: Internal Medicine

## 2014-05-13 VITALS — BP 106/64 | HR 70 | Temp 98.1°F | Wt 149.0 lb

## 2014-05-13 DIAGNOSIS — J069 Acute upper respiratory infection, unspecified: Secondary | ICD-10-CM

## 2014-05-13 DIAGNOSIS — B9789 Other viral agents as the cause of diseases classified elsewhere: Principal | ICD-10-CM

## 2014-05-13 MED ORDER — HYDROCOD POLST-CHLORPHEN POLST 10-8 MG/5ML PO LQCR: 5.0000 mL | Freq: Two times a day (BID) | ORAL | Status: DC | PRN

## 2014-05-13 NOTE — Progress Notes (Signed)
Pre visit review using our clinic review tool, if applicable. No additional management support is needed unless otherwise documented below in the visit note. 

## 2014-05-13 NOTE — Patient Instructions (Signed)
Cough, Adult  A cough is a reflex that helps clear your throat and airways. It can help heal the body or may be a reaction to an irritated airway. A cough may only last 2 or 3 weeks (acute) or may last more than 8 weeks (chronic).  CAUSES Acute cough:  Viral or bacterial infections. Chronic cough:  Infections.  Allergies.  Asthma.  Post-nasal drip.  Smoking.  Heartburn or acid reflux.  Some medicines.  Chronic lung problems (COPD).  Cancer. SYMPTOMS   Cough.  Fever.  Chest pain.  Increased breathing rate.  High-pitched whistling sound when breathing (wheezing).  Colored mucus that you cough up (sputum). TREATMENT   A bacterial cough may be treated with antibiotic medicine.  A viral cough must run its course and will not respond to antibiotics.  Your caregiver may recommend other treatments if you have a chronic cough. HOME CARE INSTRUCTIONS   Only take over-the-counter or prescription medicines for pain, discomfort, or fever as directed by your caregiver. Use cough suppressants only as directed by your caregiver.  Use a cold steam vaporizer or humidifier in your bedroom or home to help loosen secretions.  Sleep in a semi-upright position if your cough is worse at night.  Rest as needed.  Stop smoking if you smoke. SEEK IMMEDIATE MEDICAL CARE IF:   You have pus in your sputum.  Your cough starts to worsen.  You cannot control your cough with suppressants and are losing sleep.  You begin coughing up blood.  You have difficulty breathing.  You develop pain which is getting worse or is uncontrolled with medicine.  You have a fever. MAKE SURE YOU:   Understand these instructions.  Will watch your condition.  Will get help right away if you are not doing well or get worse. Document Released: 10/23/2010 Document Revised: 07/19/2011 Document Reviewed: 10/23/2010 ExitCare Patient Information 2015 [employer]. This information is not intended  to replace advice given to you by your health care provider. Make sure you discuss any questions you have with your health care provider.  

## 2014-05-13 NOTE — Progress Notes (Signed)
HPI  Pt presents to the clinic today with c/o cough and chest congestion. He reports this started 3 days ago. The cough is non productive. He has runs some low grade fever as well. He denies headache, runny nose or sore throat. He has tried Mucinex OTC without any relief. He has no history of allergies or breathing problems. He has had sick contacts with similar symptoms.  Review of Systems      Past Medical History  Diagnosis Date  . Hyperlipidemia   . Osteoporosis     osteoarthritis  . Cataract 03/2009    left eye    Family History  Problem Relation Age of Onset  . Arthritis Father   . Sleep apnea Father   . Alcohol abuse Brother   . Cancer Paternal Grandfather     prostate  . Cancer Brother     cancer    History   Social History  . Marital Status: Married    Spouse Name: N/A    Number of Children: 1  . 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 Smoking status: Never Smoker   . Smokeless tobacco: Never Used  . Alcohol Use: No  . Drug Use: No  . Sexual Activity: Not on file   Other Topics Concern  . 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   Would not want Tube Feeds.   Does not have a living will or HPOA.    Allergies  Allergen Reactions  . Demerol [Meperidine]      Constitutional: Positive fever. Denies headache, fatigue or abrupt weight changes.  HEENT:   Denies eye redness, eye pain, pressure behind the eyes, facial pain, nasal congestion, ear pain, ringing in the ears, wax buildup, runny nose or sore throat. Respiratory: Positive cough. Denies difficulty breathing or shortness of breath.  Cardiovascular: Denies chest pain, chest tightness, palpitations or swelling in the hands or feet.   No other specific complaints in a complete review of systems (except as  listed in HPI above).  Objective:   BP 106/64 mmHg  Pulse 70  Temp(Src) 98.1 F (36.7 C) (Oral)  Wt 149 lb (67.586 kg)  SpO2 97% Wt Readings from Last 3 Encounters:  05/13/14 149 lb (67.586 kg)  11/19/13 144 lb 8 oz (65.545 kg)  07/23/13 149 lb 12.8 oz (67.949 kg)     General: Appears his stated age, well developed, well nourished in NAD. HEENT: Head: normal shape and size; Eyes: sclera white, no icterus, conjunctiva pink; Ears: Tm's gray and intact, normal light reflex; Nose: mucosa pink and moist, septum midline; Throat/Mouth:  Teeth present, mucosa pink and moist, no exudate noted, no lesions or ulcerations noted.  Neck: No lymphadenopathy.  Cardiovascular: Normal rate and rhythm. S1,S2 noted.  No murmur, rubs or gallops noted.  Pulmonary/Chest: Normal effort and positive vesicular breath sounds. No respiratory distress. No wheezes, rales or ronchi noted.      Assessment & Plan:   Viral URI with cough:  Get some rest and drink plenty of water Ibuprofen for fever Rx for Hycodan cough syrup Ok to continue Mucinex  RTC as needed or if symptoms persist.

## 2014-05-16 ENCOUNTER — Telehealth: Payer: Self-pay | Admitting: Family Medicine

## 2014-05-16 NOTE — Telephone Encounter (Signed)
Patient came in on Monday to see [facility].  Patient has a very deep cough.  Patient isn't coughing anything up.  Patient said the cough medicine helps some.  Patient wants to know if this goes along with the virus or does he need to be seen.

## 2014-05-16 NOTE — Telephone Encounter (Signed)
The deep cough could go along with the virus He needs to return if he develops fever, colored mucous, or if symptoms are lasting > 1 week

## 2014-05-16 NOTE — Telephone Encounter (Signed)
Left message on voicemail.

## 2014-06-06 ENCOUNTER — Ambulatory Visit (INDEPENDENT_AMBULATORY_CARE_PROVIDER_SITE_OTHER): Payer: Medicare Other | Admitting: Internal Medicine

## 2014-06-06 ENCOUNTER — Encounter: Payer: Self-pay | Admitting: Internal Medicine

## 2014-06-06 VITALS — BP 104/58 | HR 74 | Temp 97.9°F | Wt 148.0 lb

## 2014-06-06 DIAGNOSIS — J069 Acute upper respiratory infection, unspecified: Secondary | ICD-10-CM

## 2014-06-06 MED ORDER — AZITHROMYCIN 250 MG PO TABS: ORAL_TABLET | ORAL | Status: DC

## 2014-06-06 MED ORDER — HYDROCODONE-HOMATROPINE 5-1.5 MG/5ML PO SYRP: 5.0000 mL | ORAL_SOLUTION | Freq: Three times a day (TID) | ORAL | Status: DC | PRN

## 2014-06-06 NOTE — Patient Instructions (Signed)
Upper Respiratory Infection, Adult An upper respiratory infection (URI) is also known as the common cold. It is often caused by a type of germ (virus). Colds are easily spread (contagious). You can pass it to others by kissing, coughing, sneezing, or drinking out of the same glass. Usually, you get better in 1 or 2 weeks.  HOME CARE   Only take medicine as told by your doctor.  Use a warm mist humidifier or breathe in steam from a hot shower.  Drink enough water and fluids to keep your pee (urine) clear or pale yellow.  Get plenty of rest.  Return to work when your temperature is back to normal or as told by your doctor. You may use a face mask and wash your hands to stop your cold from spreading. GET HELP RIGHT AWAY IF:   After the first few days, you feel you are getting worse.  You have questions about your medicine.  You have chills, shortness of breath, or brown or red spit (mucus).  You have yellow or brown snot (nasal discharge) or pain in the face, especially when you bend forward.  You have a fever, puffy (swollen) neck, pain when you swallow, or white spots in the back of your throat.  You have a bad headache, ear pain, sinus pain, or chest pain.  You have a high-pitched whistling sound when you breathe in and out (wheezing).  You have a lasting cough or cough up blood.  You have sore muscles or a stiff neck. MAKE SURE YOU:   Understand these instructions.  Will watch your condition.  Will get help right away if you are not doing well or get worse. Document Released: 10/13/2007 Document Revised: 07/19/2011 Document Reviewed: 08/01/2013 ExitCare Patient Information 2015 [employer]. This information is not intended to replace advice given to you by your health care provider. Make sure you discuss any questions you have with your health care provider.  

## 2014-06-06 NOTE — Progress Notes (Signed)
HPI  Bryan Avila is a 74 y.o. male presenting for c/o cough, runny nose and sore throat x 1 week. Throat feels dry and hoarse, no burning. The cough is non productive. He is blowing clear mucous out of his nose. Similar symptoms a month ago in which he was treated with Tussionex on 1/4. Symptoms improved and felt better for a couple of weeks but came back last week. He finished the Tussionex and has tried Mucinex w/ no improvement. No sick contacts. Never smoked.  Review of Systems      Past Medical History  Diagnosis Date  . Hyperlipidemia   . Osteoporosis     osteoarthritis  . Cataract 03/2009    left eye    Family History  Problem Relation Age of Onset  . Arthritis Father   . Sleep apnea Father   . Alcohol abuse Brother   . Cancer Paternal Grandfather     prostate  . Cancer Brother     cancer    History   Social History  . Marital Status: Married    Spouse Name: N/A    Number of Children: 1  . 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 Smoking status: Never Smoker   . Smokeless tobacco: Never Used  . Alcohol Use: No  . Drug Use: No  . Sexual Activity: Not on file   Other Topics Concern  . 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   Would not want Tube Feeds.   Does not have a living will or HPOA.    Allergies  Allergen Reactions  . Demerol [Meperidine]    Constitutional: Denies headache, fatigue, fever or abrupt weight changes.  HEENT:  Positive sore throat and runny nose. Denies eye redness, eye pain, pressure behind the eyes, facial pain, nasal congestion, ear pain, ringing in the ears, wax buildup or bloody nose. Respiratory: Positive cough. Denies difficulty breathing or shortness of breath.  Cardiovascular: Denies chest pain, chest tightness, palpitations or  swelling in the hands or feet.   No other specific complaints in a complete review of systems (except as listed in HPI above).  Objective:   BP 104/58 mmHg  Pulse 74  Temp(Src) 97.9 F (36.6 C) (Oral)  Wt 148 lb (67.132 kg)  SpO2 98% Wt Readings from Last 3 Encounters:  06/06/14 148 lb (67.132 kg)  05/13/14 149 lb (67.586 kg)  11/19/13 144 lb 8 oz (65.545 kg)   General: Appears his stated age, well developed, well nourished in NAD. HEENT: Head: normal shape and size, no sinus tenderness noted; Eyes: sclera white, no icterus, conjunctiva pink; Ears: Tm's intact and inflamed on right, normal light reflex; Nose: mucosa red and moist, septum midline; Throat/Mouth: Teeth present, mucosa erythematous and moist, no exudate noted, no lesions or ulcerations noted.  Neck:  No lymphadenopathy, masses, lumps or thyromegaly present.  Cardiovascular: Normal rate and rhythm. S1,S2 noted.  No murmur, rubs or gallops noted.. Pulmonary/Chest: Normal effort and positive vesicular breath sounds. No respiratory distress. No wheezes, rales or ronchi noted.      Assessment & Plan:   Upper Respiratory Infection:  Get some rest and drink plenty of water Do salt water gargles for the sore throat eRx for Azithromax x 5 days Rx for Hycodan  cough syrup Try some Claritin OTC  RTC as needed or if symptoms persist.

## 2014-11-21 ENCOUNTER — Encounter: Payer: Self-pay | Admitting: Family Medicine

## 2014-11-21 ENCOUNTER — Ambulatory Visit (INDEPENDENT_AMBULATORY_CARE_PROVIDER_SITE_OTHER): Payer: Medicare Other | Admitting: Family Medicine

## 2014-11-21 ENCOUNTER — Encounter: Payer: Self-pay | Admitting: *Deleted

## 2014-11-21 VITALS — BP 120/70 | HR 64 | Temp 98.2°F | Ht 66.5 in | Wt 143.8 lb

## 2014-11-21 DIAGNOSIS — H9193 Unspecified hearing loss, bilateral: Secondary | ICD-10-CM | POA: Diagnosis not present

## 2014-11-21 DIAGNOSIS — Z1211 Encounter for screening for malignant neoplasm of colon: Secondary | ICD-10-CM

## 2014-11-21 DIAGNOSIS — Z0000 Encounter for general adult medical examination without abnormal findings: Secondary | ICD-10-CM | POA: Diagnosis not present

## 2014-11-21 DIAGNOSIS — Z125 Encounter for screening for malignant neoplasm of prostate: Secondary | ICD-10-CM

## 2014-11-21 DIAGNOSIS — E7800 Pure hypercholesterolemia, unspecified: Secondary | ICD-10-CM

## 2014-11-21 DIAGNOSIS — R634 Abnormal weight loss: Secondary | ICD-10-CM | POA: Diagnosis not present

## 2014-11-21 LAB — LIPID PANEL
Cholesterol: 184 mg/dL (ref 0–200)
HDL: 77.3 mg/dL (ref >39.00)
LDL Cholesterol: 97 mg/dL (ref 0–99)
NonHDL: 106.7
Total CHOL/HDL Ratio: 2
Triglycerides: 50 mg/dL (ref 0.0–149.0)
VLDL: 10 mg/dL (ref 0.0–40.0)

## 2014-11-21 LAB — CBC WITH DIFFERENTIAL/PLATELET
Basophils Absolute: 0 10*3/uL (ref 0.0–0.1)
Basophils Relative: 0.6 % (ref 0.0–3.0)
EOS ABS: 0.2 10*3/uL (ref 0.0–0.7)
EOS PCT: 3.4 % (ref 0.0–5.0)
HEMATOCRIT: 45 % (ref 39.0–52.0)
Hemoglobin: 14.9 g/dL (ref 13.0–17.0)
LYMPHS ABS: 1.6 10*3/uL (ref 0.7–4.0)
Lymphocytes Relative: 24.5 % (ref 12.0–46.0)
MCHC: 33.1 g/dL (ref 30.0–36.0)
MCV: 92.2 fl (ref 78.0–100.0)
MONO ABS: 0.6 10*3/uL (ref 0.1–1.0)
MONOS PCT: 8.8 % (ref 3.0–12.0)
NEUTROS PCT: 62.7 % (ref 43.0–77.0)
Neutro Abs: 4.2 10*3/uL (ref 1.4–7.7)
Platelets: 258 10*3/uL (ref 150.0–400.0)
RBC: 4.88 Mil/uL (ref 4.22–5.81)
RDW: 14.8 % (ref 11.5–15.5)
WBC: 6.6 10*3/uL (ref 4.0–10.5)

## 2014-11-21 LAB — COMPREHENSIVE METABOLIC PANEL
ALBUMIN: 4 g/dL (ref 3.5–5.2)
ALT: 13 U/L (ref 0–53)
AST: 17 U/L (ref 0–37)
Alkaline Phosphatase: 75 U/L (ref 39–117)
BILIRUBIN TOTAL: 0.7 mg/dL (ref 0.2–1.2)
BUN: 14 mg/dL (ref 6–23)
CO2: 32 mEq/L (ref 19–32)
Calcium: 9.4 mg/dL (ref 8.4–10.5)
Chloride: 104 mEq/L (ref 96–112)
Creatinine, Ser: 0.78 mg/dL (ref 0.40–1.50)
GFR: 103.46 mL/min (ref >60.00)
Glucose, Bld: 90 mg/dL (ref 70–99)
POTASSIUM: 4.2 meq/L (ref 3.5–5.1)
SODIUM: 141 meq/L (ref 135–145)
TOTAL PROTEIN: 6.7 g/dL (ref 6.0–8.3)

## 2014-11-21 LAB — PSA, MEDICARE: PSA: 0.68 ng/mL (ref 0.10–4.00)

## 2014-11-21 NOTE — Assessment & Plan Note (Signed)
Refusing hearing aids

## 2014-11-21 NOTE — Patient Instructions (Signed)
Good to see you. We will call you with your lab results.   

## 2014-11-21 NOTE — Progress Notes (Signed)
Patient ID: Bryan Avila, male    DOB: Jan 08, 1941, 74 y.o.   MRN: [id number]  HPI Comments: Bryan Avila is a very pleasant 74 year old gentleman here  for medicare annual wellness visit and follow up of chronic medical conditions.  I have personally reviewed the Medicare Annual Wellness questionnaire and have noted 1. The patient's medical and social history 2. Their use of alcohol, tobacco or illicit drugs 3. Their current medications and supplements 4. The patient's functional ability including ADL's, fall risks, home safety risks and hearing or visual             impairment. 5. Diet and physical activities 6. Evidence for depression or mood disorders  End of life wishes discussed and updated in Social History.  Doing well.  Walks every day. Feels great.  Has not complaints.  The roster of all physicians providing medical care to patient - is listed in the Snapshot section of the chart.  Pneumovax 03/31/07 Prevnar 13 11/19/13 Td 10/28/06 Colonoscopy 01/06/08 (Dr. Collene Mares)- 10 year recall Zoster 03/2010  Has had hearing loss for years but feels he cannot afford hearing aids.  Saw dermatologist last week (Dr. Delman Cheadle).  Sees optho twice a year- Dr. Ellie Lunch- last seen 07/3014.  Takes only ASA daily.  He has lost 6 pounds since last year.  Unintentional.  He denies feeling full early.  No abdominal pain.  No nausea or vomiting.  No black or bloody stools. He has been "trying to eat right," stays active with his grandsons.  Wt Readings from Last 3 Encounters:  11/21/14 143 lb 12 oz (65.205 kg)  06/06/14 148 lb (67.132 kg)  05/13/14 149 lb (67.586 kg)    Lab Results  Component Value Date   CHOL 197 11/19/2013   HDL 78.10 11/19/2013   LDLCALC 109* 11/19/2013   TRIG 51.0 11/19/2013   CHOLHDL 3 11/19/2013   Lab Results  Component Value Date   PSA 0.63 11/19/2013   PSA 0.76 11/13/2012   PSA 0.80 11/17/2011    Patient Active Problem List   Diagnosis Date Noted  . Medicare annual  wellness visit, subsequent 11/19/2013  . Right bundle branch block 09/10/2010  . 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 HYPERCHOLESTEROLEMIA, PURE 10/26/2006  . HEARING LOSS, HIGH FREQUENCY 10/26/2006  . Osteoarthritis 10/26/2006   Past Medical History  Diagnosis Date  . Hyperlipidemia   . Osteoporosis     osteoarthritis  . Cataract 03/2009    left eye   Past Surgical History  Procedure Laterality Date  . Thumb surgery  1999    right  . Eye surgery     History  Substance Use Topics  . Smoking status: Never Smoker   . Smokeless tobacco: Never Used  . Alcohol Use: No   Family History  Problem Relation Age of Onset  . Arthritis Father   . Sleep apnea Father   . Alcohol abuse Brother   . Cancer Paternal Grandfather     prostate  . Cancer Brother     cancer   Allergies  Allergen Reactions  . Demerol [Meperidine]    Current Outpatient Prescriptions on File Prior to Visit  Medication Sig Dispense Refill  . acyclovir (ZOVIRAX) 200 MG capsule Take 400 mg by mouth 2 (two) times daily.     . ASPIRIN LOW STRENGTH PO Take one by mouth three times a week    . Multiple Vitamins-Minerals (EQL PROTECTAVISION PO) Take by mouth.     No current facility-administered  medications on file prior to visit.   The PMH, PSH, Social History, Family History, Medications, and allergies have been reviewed in [facility], and have been updated if relevant.   Review of Systems  Constitutional: Positive for unexpected weight change.  HENT: Negative.   Eyes: Negative.   Respiratory: Negative.   Cardiovascular: Negative.   Gastrointestinal: Negative.   Endocrine: Negative.   Genitourinary: Negative.   Musculoskeletal: Negative.   Allergic/Immunologic: Negative.   Neurological: Negative.   Hematological: Negative.   Psychiatric/Behavioral: Negative.   All other systems reviewed and are negative.      Physical Exam  BP 120/70 mmHg  Pulse 64  Temp(Src) 98.2 F (36.8 C) (Oral)   Ht 5' 6.5" (1.689 m)  Wt 143 lb 12 oz (65.205 kg)  BMI 22.86 kg/m2  SpO2 98%  Wt Readings from Last 3 Encounters:  11/21/14 143 lb 12 oz (65.205 kg)  06/06/14 148 lb (67.132 kg)  05/13/14 149 lb (67.586 kg)     Constitutional: He is oriented to person, place, and time. He appears well-developed and well-nourished.  Hard of hearing- baseline HENT: TMs clear bilaterally Head: Normocephalic.  Nose: Nose normal.  Mouth/Throat: Oropharynx is clear and moist.  Eyes: Conjunctivae are normal. Pupils are equal, round, and reactive to light.  Neck: Normal range of motion. Neck supple. No JVD present.  Cardiovascular: Normal rate, regular rhythm, S1 normal, S2 normal, normal heart sounds and intact distal pulses.  Exam reveals no gallop and no friction rub.   No murmur heard. Pulmonary/Chest: Effort normal and breath sounds normal. No respiratory distress. He has no wheezes. He has no rales. He exhibits no tenderness.  Abdominal: Soft. Bowel sounds are normal. He exhibits no distension. There is no tenderness.  Musculoskeletal: Normal range of motion. He exhibits no edema and no tenderness.  Lymphadenopathy:    He has no cervical adenopathy.  Neurological: He is alert and oriented to person, place, and time. Coordination normal.  Skin: Skin is warm and dry. No rash noted. No erythema.  Psychiatric: He has a normal mood and affect. His behavior is normal. Judgment and thought content normal.

## 2014-11-21 NOTE — Assessment & Plan Note (Signed)
The patients weight, height, BMI and visual acuity have been recorded in the chart.  Cognitive function assessed.   I have made referrals, counseling and provided education to the patient based review of the above and I have provided the pt with a written personalized care plan for preventive services.  Orders Placed This Encounter  Procedures  . Comprehensive metabolic panel  . Lipid panel  . PSA, Medicare

## 2014-11-21 NOTE — Assessment & Plan Note (Signed)
New- unintentional. Colonoscopy UTD.  Check labs today, IFOB as initial work up.

## 2014-11-21 NOTE — Progress Notes (Signed)
Pre visit review using our clinic review tool, if applicable. No additional management support is needed unless otherwise documented below in the visit note. 

## 2014-11-21 NOTE — Addendum Note (Signed)
Addended by: Ellamae Sia on: 11/21/2014 09:49 AM   Modules accepted: Orders

## 2014-11-21 NOTE — Assessment & Plan Note (Signed)
Due for labs

## 2014-12-03 ENCOUNTER — Encounter: Payer: Self-pay | Admitting: *Deleted

## 2014-12-03 ENCOUNTER — Other Ambulatory Visit (INDEPENDENT_AMBULATORY_CARE_PROVIDER_SITE_OTHER): Payer: Medicare Other

## 2014-12-03 DIAGNOSIS — Z1211 Encounter for screening for malignant neoplasm of colon: Secondary | ICD-10-CM | POA: Diagnosis not present

## 2014-12-03 LAB — FECAL OCCULT BLOOD, IMMUNOCHEMICAL: FECAL OCCULT BLD: NEGATIVE

## 2014-12-16 ENCOUNTER — Encounter (HOSPITAL_COMMUNITY): Payer: Self-pay | Admitting: Emergency Medicine

## 2014-12-16 ENCOUNTER — Emergency Department (HOSPITAL_COMMUNITY)
Admission: EM | Admit: 2014-12-16 | Discharge: 2014-12-16 | Disposition: A | Discharge status: Home / Self Care | Payer: Medicare Other | Attending: Emergency Medicine | Admitting: Emergency Medicine

## 2014-12-16 ENCOUNTER — Emergency Department (HOSPITAL_COMMUNITY): Payer: Medicare Other

## 2014-12-16 DIAGNOSIS — Y998 Other external cause status: Secondary | ICD-10-CM | POA: Diagnosis not present

## 2014-12-16 DIAGNOSIS — Z79899 Other long term (current) drug therapy: Secondary | ICD-10-CM | POA: Diagnosis not present

## 2014-12-16 DIAGNOSIS — T07XXXA Unspecified multiple injuries, initial encounter: Secondary | ICD-10-CM

## 2014-12-16 DIAGNOSIS — Z8739 Personal history of other diseases of the musculoskeletal system and connective tissue: Secondary | ICD-10-CM | POA: Insufficient documentation

## 2014-12-16 DIAGNOSIS — W010XXA Fall on same level from slipping, tripping and stumbling without subsequent striking against object, initial encounter: Secondary | ICD-10-CM | POA: Diagnosis not present

## 2014-12-16 DIAGNOSIS — Z973 Presence of spectacles and contact lenses: Secondary | ICD-10-CM | POA: Insufficient documentation

## 2014-12-16 DIAGNOSIS — S50312A Abrasion of left elbow, initial encounter: Secondary | ICD-10-CM | POA: Insufficient documentation

## 2014-12-16 DIAGNOSIS — Z23 Encounter for immunization: Secondary | ICD-10-CM | POA: Insufficient documentation

## 2014-12-16 DIAGNOSIS — S0081XA Abrasion of other part of head, initial encounter: Secondary | ICD-10-CM | POA: Diagnosis not present

## 2014-12-16 DIAGNOSIS — S61412A Laceration without foreign body of left hand, initial encounter: Secondary | ICD-10-CM | POA: Diagnosis not present

## 2014-12-16 DIAGNOSIS — Z8639 Personal history of other endocrine, nutritional and metabolic disease: Secondary | ICD-10-CM | POA: Insufficient documentation

## 2014-12-16 DIAGNOSIS — S80212A Abrasion, left knee, initial encounter: Secondary | ICD-10-CM | POA: Insufficient documentation

## 2014-12-16 DIAGNOSIS — H539 Unspecified visual disturbance: Secondary | ICD-10-CM | POA: Insufficient documentation

## 2014-12-16 DIAGNOSIS — Z7982 Long term (current) use of aspirin: Secondary | ICD-10-CM | POA: Diagnosis not present

## 2014-12-16 DIAGNOSIS — Y9289 Other specified places as the place of occurrence of the external cause: Secondary | ICD-10-CM | POA: Diagnosis not present

## 2014-12-16 DIAGNOSIS — Y9389 Activity, other specified: Secondary | ICD-10-CM | POA: Diagnosis not present

## 2014-12-16 MED ORDER — TRAMADOL HCL 50 MG PO TABS: 50.0000 mg | ORAL_TABLET | Freq: Four times a day (QID) | ORAL | Status: DC | PRN

## 2014-12-16 MED ORDER — TETANUS-DIPHTH-ACELL PERTUSSIS 5-2.5-18.5 LF-MCG/0.5 IM SUSP
0.5000 mL | Freq: Once | INTRAMUSCULAR | Status: AC
  Administered 2014-12-16: 0.5 mL via INTRAMUSCULAR
  Filled 2014-12-16: qty 0.5

## 2014-12-16 MED ORDER — LIDOCAINE HCL 2 % IJ SOLN
5.0000 mL | Freq: Once | INTRAMUSCULAR | Status: AC
  Administered 2014-12-16: 100 mg
  Filled 2014-12-16: qty 20

## 2014-12-16 NOTE — Discharge Instructions (Signed)
Abrasion An abrasion is a cut or scrape of the skin. Abrasions do not extend through all layers of the skin and most heal within 10 days. It is important to care for your abrasion properly to prevent infection. CAUSES  Most abrasions are caused by falling on, or gliding across, the ground or other surface. When your skin rubs on something, the outer and inner layer of skin rubs off, causing an abrasion. DIAGNOSIS  Your caregiver will be able to diagnose an abrasion during a physical exam.  TREATMENT  Your treatment depends on how large and deep the abrasion is. Generally, your abrasion will be cleaned with water and a mild soap to remove any dirt or debris. An antibiotic ointment may be put over the abrasion to prevent an infection. A bandage (dressing) may be wrapped around the abrasion to keep it from getting dirty.  You may need a tetanus shot if:  You cannot remember when you had your last tetanus shot.  You have never had a tetanus shot.  The injury broke your skin. If you get a tetanus shot, your arm may swell, get red, and feel warm to the touch. This is common and not a problem. If you need a tetanus shot and you choose not to have one, there is a rare chance of getting tetanus. Sickness from tetanus can be serious.  HOME CARE INSTRUCTIONS   If a dressing was applied, change it at least once a day or as directed by your caregiver. If the bandage sticks, soak it off with warm water.   Wash the area with water and a mild soap to remove all the ointment 2 times a day. Rinse off the soap and pat the area dry with a clean towel.   Reapply any ointment as directed by your caregiver. This will help prevent infection and keep the bandage from sticking. Use gauze over the wound and under the dressing to help keep the bandage from sticking.   Change your dressing right away if it becomes wet or dirty.   Only take over-the-counter or prescription medicines for pain, discomfort, or fever as  directed by your caregiver.   Follow up with your caregiver within 24-48 hours for a wound check, or as directed. If you were not given a wound-check appointment, look closely at your abrasion for redness, swelling, or pus. These are signs of infection. SEEK IMMEDIATE MEDICAL CARE IF:   You have increasing pain in the wound.   You have redness, swelling, or tenderness around the wound.   You have pus coming from the wound.   You have a fever or persistent symptoms for more than 2-3 days.  You have a fever and your symptoms suddenly get worse.  You have a bad smell coming from the wound or dressing.  MAKE SURE YOU:   Understand these instructions.  Will watch your condition.  Will get help right away if you are not doing well or get worse. Document Released: 02/03/2005 Document Revised: 04/12/2012 Document Reviewed: 03/30/2011 [facility] Patient Information 2015 [place], [place]. This information is not intended to replace advice given to you by your health care provider. Make sure you discuss any questions you have with your health care provider.  Laceration Care, Adult A laceration is a cut or lesion that goes through all layers of the skin and into the tissue just beneath the skin. TREATMENT  Some lacerations may not require closure. Some lacerations may not be able to be closed due to  an increased risk of infection. It is important to see your caregiver as soon as possible after an injury to minimize the risk of infection and maximize the opportunity for successful closure. If closure is appropriate, pain medicines may be given, if needed. The wound will be cleaned to help prevent infection. Your caregiver will use stitches (sutures), staples, wound glue (adhesive), or skin adhesive strips to repair the laceration. These tools bring the skin edges together to allow for faster healing and a better cosmetic outcome. However, all wounds will heal with a scar. Once the wound has  healed, scarring can be minimized by covering the wound with sunscreen during the day for 1 full year. HOME CARE INSTRUCTIONS  For sutures or staples:  Keep the wound clean and dry.  If you were given a bandage (dressing), you should change it at least once a day. Also, change the dressing if it becomes wet or dirty, or as directed by your caregiver.  Wash the wound with soap and water 2 times a day. Rinse the wound off with water to remove all soap. Pat the wound dry with a clean towel.  After cleaning, apply a thin layer of the antibiotic ointment as recommended by your caregiver. This will help prevent infection and keep the dressing from sticking.  You may shower as usual after the first 24 hours. Do not soak the wound in water until the sutures are removed.  Only take over-the-counter or prescription medicines for pain, discomfort, or fever as directed by your caregiver.  Get your sutures or staples removed as directed by your caregiver. For skin adhesive strips:  Keep the wound clean and dry.  Do not get the skin adhesive strips wet. You may bathe carefully, using caution to keep the wound dry.  If the wound gets wet, pat it dry with a clean towel.  Skin adhesive strips will fall off on their own. You may trim the strips as the wound heals. Do not remove skin adhesive strips that are still stuck to the wound. They will fall off in time. For wound adhesive:  You may briefly wet your wound in the shower or bath. Do not soak or scrub the wound. Do not swim. Avoid periods of heavy perspiration until the skin adhesive has fallen off on its own. After showering or bathing, gently pat the wound dry with a clean towel.  Do not apply liquid medicine, cream medicine, or ointment medicine to your wound while the skin adhesive is in place. This may loosen the film before your wound is healed.  If a dressing is placed over the wound, be careful not to apply tape directly over the skin  adhesive. This may cause the adhesive to be pulled off before the wound is healed.  Avoid prolonged exposure to sunlight or tanning lamps while the skin adhesive is in place. Exposure to ultraviolet light in the first year will darken the scar.  The skin adhesive will usually remain in place for 5 to 10 days, then naturally fall off the skin. Do not pick at the adhesive film. You may need a tetanus shot if:  You cannot remember when you had your last tetanus shot.  You have never had a tetanus shot. If you get a tetanus shot, your arm may swell, get red, and feel warm to the touch. This is common and not a problem. If you need a tetanus shot and you choose not to have one, there is a rare chance  of getting tetanus. Sickness from tetanus can be serious. SEEK MEDICAL CARE IF:   You have redness, swelling, or increasing pain in the wound.  You see a red line that goes away from the wound.  You have yellowish-white fluid (pus) coming from the wound.  You have a fever.  You notice a bad smell coming from the wound or dressing.  Your wound breaks open before or after sutures have been removed.  You notice something coming out of the wound such as wood or glass.  Your wound is on your hand or foot and you cannot move a finger or toe. SEEK IMMEDIATE MEDICAL CARE IF:   Your pain is not controlled with prescribed medicine.  You have severe swelling around the wound causing pain and numbness or a change in color in your arm, hand, leg, or foot.  Your wound splits open and starts bleeding.  You have worsening numbness, weakness, or loss of function of any joint around or beyond the wound.  You develop painful lumps near the wound or on the skin anywhere on your body. MAKE SURE YOU:   Understand these instructions.  Will watch your condition.  Will get help right away if you are not doing well or get worse. Document Released: 04/26/2005 Document Revised: 07/19/2011 Document Reviewed:  10/20/2010 [facility] Patient Information 2015 [place], [place]. This information is not intended to replace advice given to you by your health care provider. Make sure you discuss any questions you have with your health care provider.

## 2014-12-16 NOTE — ED Provider Notes (Signed)
CSN: [id number]     Arrival date & time 12/16/14  1901 History  This chart was scribed for non-physician practitioner [name], PA-C working with Christ Kick MD by Zola Button, ED Scribe. This patient was seen in room TR10C/TR10C and the patient's care was started at 7:45 PM.      Chief Complaint  Patient presents with  . Fall   The history is provided by the patient. No language interpreter was used.    HPI Comments: Bryan Avila is a 74 y.o. male with a history of osteoporosis who presents to the Emergency Department complaining of a fall that occurred about an hour ago after tripping over a curb. Patient has abrasions to his left knee, left elbow, and left cheek. He also has a laceration to his left palm. He also reports having some visual changes to his left eye, stating he has had some difficulty focusing in his left eye since the fall. He wear glasses which became bent during the fall.  He has tried to adjust them but without success. He had a cataract surgery in his left eye several years ago. Patient denies LOC, neck pain, and headache.  He takes baby aspirin 3 times a week, but does not take any other blood thinners. Patient is left hand dominant. He has allergies to demerol.  PCP: Dr. Deborra Medina  Past Medical History  Diagnosis Date  . Hyperlipidemia   . Osteoporosis     osteoarthritis  . Cataract 03/2009    left eye   Past Surgical History  Procedure Laterality Date  . Thumb surgery  1999    right  . Eye surgery     Family History  Problem Relation Age of Onset  . Arthritis Father   . Sleep apnea Father   . Alcohol abuse Brother   . Cancer Paternal Grandfather     prostate  . Cancer Brother     cancer   Social History  Substance Use Topics  . Smoking status: Never Smoker   . Smokeless tobacco: Never Used  . Alcohol Use: No    Review of Systems  Constitutional: Negative for fever.  HENT: Negative for congestion and sore throat.   Eyes: Positive for visual  disturbance.  Respiratory: Negative for chest tightness and shortness of breath.   Cardiovascular: Negative for chest pain.  Gastrointestinal: Negative for nausea and abdominal pain.  Genitourinary: Negative.   Musculoskeletal: Positive for arthralgias. Negative for joint swelling and neck pain.  Skin: Positive for wound. Negative for rash.  Neurological: Negative for dizziness, syncope, weakness, light-headedness, numbness and headaches.  Psychiatric/Behavioral: Negative.       Allergies  Demerol  Home Medications   Prior to Admission medications   Medication Sig Start Date End Date Taking? Authorizing Provider  acyclovir (ZOVIRAX) 200 MG capsule Take 400 mg by mouth 2 (two) times daily.     Historical Provider, MD  ASPIRIN LOW STRENGTH PO Take one by mouth three times a week    Historical Provider, MD  Multiple Vitamins-Minerals (EQL PROTECTAVISION PO) Take by mouth.    Historical Provider, MD  traMADol (ULTRAM) 50 MG tablet Take 1 tablet (50 mg total) by mouth every 6 (six) hours as needed. 12/16/14   [name], PA-C   BP 124/83 mmHg  Pulse 75  Temp(Src) 97.6 F (36.4 C) (Oral)  Resp 16  Ht 5' 6.5" (1.689 m)  Wt 145 lb (65.772 kg)  BMI 23.06 kg/m2  SpO2 97% Physical Exam  Constitutional:  He is oriented to person, place, and time. He appears well-developed and well-nourished.  HENT:  Head: Normocephalic and atraumatic.  Right Ear: No hemotympanum.  Left Ear: No hemotympanum.  Nose: Nose normal.  Abrasion left maxilla. No deformity, no edema.  TTP  Eyes: Conjunctivae and EOM are normal. Pupils are equal, round, and reactive to light.  No globe injuries.  Neck: Normal range of motion and full passive range of motion without pain. No spinous process tenderness and no muscular tenderness present. Normal range of motion present.  Cardiovascular: Normal rate, regular rhythm, normal heart sounds and intact distal pulses.   Pulmonary/Chest: Effort normal and breath sounds  normal. He has no wheezes.  Abdominal: Soft. Bowel sounds are normal. There is no tenderness.  Musculoskeletal: Normal range of motion.       Left elbow: He exhibits swelling. He exhibits no effusion and no deformity. Tenderness found. Lateral epicondyle tenderness noted.       Left knee: He exhibits normal range of motion, no swelling, no effusion, no deformity, no LCL laxity and no MCL laxity. Tenderness found. Lateral joint line tenderness noted.       Left hand: He exhibits laceration. He exhibits normal range of motion, normal capillary refill and no deformity.  Neurological: He is alert and oriented to person, place, and time. He has normal strength. No cranial nerve deficit or sensory deficit. Coordination normal.  Skin: Skin is warm and dry.  Mild edema, abrasion noted left lateral elbow/forearm.  Abrasion also left lateral knee.  2 cm irregular laceration left mid palm. Hemostatic, subcutaneous.   Psychiatric: He has a normal mood and affect.  Nursing note and vitals reviewed.   ED Course  Procedures  DIAGNOSTIC STUDIES: Oxygen Saturation is 96% on room air, adequate by my interpretation.    COORDINATION OF CARE: 7:52 PM-Discussed treatment plan which includes XR imaging and laceration repair with patient/guardian at bedside and patient/guardian agreed to plan.   LACERATION REPAIR PROCEDURE NOTE The patient's identification was confirmed and consent was obtained. This procedure was performed by [name], PA-C at 12:25 PM. Site: left hand Sterile procedures observed Anesthetic used (type and amt): lidocaine 2% without epi.  2cc Suture type/size:ethilon 4-0 Length:2 cm # of Sutures: 5 Technique:simple interrupted Complexity simple Antibx ointment applied  na Tetanus UTD or ordered  updated Site anesthetized, irrigated with NS, explored without evidence of foreign body, wound well approximated, site covered with dry, sterile dressing.  Patient tolerated procedure well  without complications. Instructions for care discussed verbally and patient provided with additional written instructions for homecare and f/u.   Labs Review Labs Reviewed - No data to display  Imaging Review Dg Elbow Complete Left  12/16/2014   CLINICAL DATA:  Status post trip and fall today with a blow to the left elbow. Pain. Initial encounter.  EXAM: LEFT ELBOW - COMPLETE 3+ VIEW  COMPARISON:  None.  FINDINGS: There is no evidence of fracture, dislocation, or joint effusion. There is no evidence of arthropathy or other focal bone abnormality. Soft tissues are unremarkable.  IMPRESSION: Negative exam.   Electronically Signed   By: Inge Rise M.D.   On: 12/16/2014 20:44   Ct Head Wo Contrast  12/16/2014   CLINICAL DATA:  Pain following fall  EXAM: CT HEAD WITHOUT CONTRAST  CT MAXILLOFACIAL WITHOUT CONTRAST  TECHNIQUE: Multidetector CT imaging of the head and maxillofacial structures were performed using the standard protocol without intravenous contrast. Multiplanar CT image reconstructions of the maxillofacial structures  were also generated.  COMPARISON:  None.  FINDINGS: CT HEAD FINDINGS  There is age related volume loss. There is no intracranial mass, hemorrhage, extra-axial fluid collection, or midline shift. There is minimal small vessel disease in the centra semiovale bilaterally. Elsewhere gray-white compartments appear. No acute infarct evident. Bony calvarium appears intact. The mastoid air cells are clear. There is rightward deviation of the nasal septum.  CT MAXILLOFACIAL FINDINGS  There is no appreciable fracture or dislocation. Orbits appear symmetric and normal bilaterally. No intraorbital lesions are identified.  There is bony hypertrophy along the inferior alveolar ridge bilaterally which appears benign.  There is mucosal thickening and each maxillary antrum, more on the left than on the right. There are small retention cysts in the left maxillary antrum. Other paranasal sinuses are  clear. Ostiomeatal unit complexes are patent bilaterally. There is edema of the inferior nasal turbinate on the left. There is rightward deviation of the nasal septum with a bony spur toward the right. There is a small concha bullosa on the right, an anatomic variant.  Salivary glands appear symmetric and within normal limits. No adenopathy is appreciable. There is mild degenerative change in the visualized cervical spine.  IMPRESSION: CT head: Age related volume loss with minimal periventricular small vessel disease. No intracranial mass, hemorrhage, or extra-axial fluid collection. No acute appearing infarct evident.  CT maxillofacial: No fracture or dislocation. No intraorbital lesions. Paranasal sinus disease in the maxillary antra, more on the left than on the right. Rightward deviation of the nasal septum. Nasal turbinate edema on the left without nares obstruction. Ostiomeatal unit complexes patent bilaterally.   Electronically Signed   By: Lowella Grip III M.D.   On: 12/16/2014 20:39   Dg Knee Complete 4 Views Left  12/16/2014   CLINICAL DATA:  Status post fall today. Left knee pain. Initial encounter.  EXAM: LEFT KNEE - COMPLETE 4+ VIEW  COMPARISON:  None.  FINDINGS: There is no evidence of fracture, dislocation, or joint effusion. There is no evidence of arthropathy or other focal bone abnormality. Soft tissues are unremarkable.  IMPRESSION: Negative exam.   Electronically Signed   By: Inge Rise M.D.   On: 12/16/2014 20:46   Dg Hand Complete Left  12/16/2014   CLINICAL DATA:  74 year old male with right hand pain and abrasions after falling on the sidewalk earlier today  EXAM: LEFT HAND - COMPLETE 3+ VIEW  COMPARISON:  Concurrently obtained radiographs of the elbow and the  FINDINGS: No evidence of acute fracture or malalignment. Degenerative osteoarthritis present at the thumb [facility] joint as well as the thumb interphalangeal joint. No evidence of lytic or blastic osseous lesion.  IMPRESSION:  No acute osseous abnormality.   Electronically Signed   By: Jacqulynn Cadet M.D.   On: 12/16/2014 20:44   Ct Maxillofacial Wo Cm  12/16/2014   CLINICAL DATA:  Pain following fall  EXAM: CT HEAD WITHOUT CONTRAST  CT MAXILLOFACIAL WITHOUT CONTRAST  TECHNIQUE: Multidetector CT imaging of the head and maxillofacial structures were performed using the standard protocol without intravenous contrast. Multiplanar CT image reconstructions of the maxillofacial structures were also generated.  COMPARISON:  None.  FINDINGS: CT HEAD FINDINGS  There is age related volume loss. There is no intracranial mass, hemorrhage, extra-axial fluid collection, or midline shift. There is minimal small vessel disease in the centra semiovale bilaterally. Elsewhere gray-white compartments appear. No acute infarct evident. Bony calvarium appears intact. The mastoid air cells are clear. There is rightward deviation of the  nasal septum.  CT MAXILLOFACIAL FINDINGS  There is no appreciable fracture or dislocation. Orbits appear symmetric and normal bilaterally. No intraorbital lesions are identified.  There is bony hypertrophy along the inferior alveolar ridge bilaterally which appears benign.  There is mucosal thickening and each maxillary antrum, more on the left than on the right. There are small retention cysts in the left maxillary antrum. Other paranasal sinuses are clear. Ostiomeatal unit complexes are patent bilaterally. There is edema of the inferior nasal turbinate on the left. There is rightward deviation of the nasal septum with a bony spur toward the right. There is a small concha bullosa on the right, an anatomic variant.  Salivary glands appear symmetric and within normal limits. No adenopathy is appreciable. There is mild degenerative change in the visualized cervical spine.  IMPRESSION: CT head: Age related volume loss with minimal periventricular small vessel disease. No intracranial mass, hemorrhage, or extra-axial fluid  collection. No acute appearing infarct evident.  CT maxillofacial: No fracture or dislocation. No intraorbital lesions. Paranasal sinus disease in the maxillary antra, more on the left than on the right. Rightward deviation of the nasal septum. Nasal turbinate edema on the left without nares obstruction. Ostiomeatal unit complexes patent bilaterally.   Electronically Signed   By: Lowella Grip III M.D.   On: 12/16/2014 20:39     EKG Interpretation None      MDM   Final diagnoses:  Hand laceration, left, initial encounter  Abrasions of multiple sites    Wound care instructions given.  Pt advised to have sutures removed in 10 days,  Return here sooner for any signs of infection including redness, swelling, worse pain or drainage of pus.  Pt was also seen by Dr. Eulis Foster prior to dc home.  Patients labs and/or radiological studies were reviewed and considered during the medical decision making and disposition process. Imaging was reviewed, interpreted and I agree with radiologists reading. Results were also discussed with patient.     I personally performed the services described in this documentation, which was scribed in my presence. The recorded information has been reviewed and is accurate.   [name], PA-C 12/18/14 [place], MD 12/18/14 [phone]

## 2014-12-16 NOTE — ED Notes (Signed)
Iodine soak provided

## 2014-12-16 NOTE — ED Notes (Signed)
Pt stable, ambulatory, states understanding of discharge instructions 

## 2014-12-16 NOTE — ED Provider Notes (Signed)
  Face-to-face evaluation   History: Patient describes a mechanical fall when he caught his toe on an irregular surface of the pavement. He was able to ambulate afterwards and complains of abrasions and laceration to the left palm.  Physical exam: Alert, calm, cooperative. Moving arms and legs normally. Gaping laceration left thenar eminence. Normal range of motion left hand.  Medical screening examination/treatment/procedure(s) were conducted as a shared visit with non-physician practitioner(s) and myself.  I personally evaluated the patient during the encounter  Daleen Bo, MD 12/18/14 1335

## 2014-12-16 NOTE — ED Notes (Signed)
Pt reports that he tripped over curb and fell. Abrasions noted to L knee L elbow L cheek- no loc, is not on blood thinners. Laceration also noted to palm of L hand. Moves extremities well.

## 2014-12-26 ENCOUNTER — Ambulatory Visit (INDEPENDENT_AMBULATORY_CARE_PROVIDER_SITE_OTHER): Payer: Medicare Other | Admitting: Family Medicine

## 2014-12-26 ENCOUNTER — Encounter: Payer: Self-pay | Admitting: Family Medicine

## 2014-12-26 VITALS — BP 142/70 | HR 72 | Temp 98.1°F | Wt 148.5 lb

## 2014-12-26 DIAGNOSIS — S61412D Laceration without foreign body of left hand, subsequent encounter: Secondary | ICD-10-CM

## 2014-12-26 DIAGNOSIS — Z4802 Encounter for removal of sutures: Secondary | ICD-10-CM | POA: Diagnosis not present

## 2014-12-26 DIAGNOSIS — W101XXD Fall (on)(from) sidewalk curb, subsequent encounter: Secondary | ICD-10-CM | POA: Diagnosis not present

## 2014-12-26 DIAGNOSIS — W101XXA Fall (on)(from) sidewalk curb, initial encounter: Secondary | ICD-10-CM | POA: Insufficient documentation

## 2014-12-26 NOTE — Assessment & Plan Note (Signed)
Patient presents for suture removal. The wound is well healed without signs of infection.  The sutures are removed. Wound care and activity instructions given. Return prn.

## 2014-12-26 NOTE — Progress Notes (Signed)
Pre visit review using our clinic review tool, if applicable. No additional management support is needed unless otherwise documented below in the visit note. 

## 2014-12-26 NOTE — Progress Notes (Signed)
Subjective:   Patient ID: Bryan Avila, male    DOB: 02-05-1941, 74 y.o.   MRN: [id number]  Bryan Avila is a pleasant 74 y.o. year old male who presents to clinic today with Suture / Staple Removal  on 12/26/2014  HPI:  Was seen in ER on 12/18/14 s/p fall after tripping over a curb.  Notes reviewed.  Laceration to his left palm was repaired with sutures.  Imaging of elbow, hand and face negative.  Dg Elbow Complete Left  12/16/2014   CLINICAL DATA:  Status post trip and fall today with a blow to the left elbow. Pain. Initial encounter.  EXAM: LEFT ELBOW - COMPLETE 3+ VIEW  COMPARISON:  None.  FINDINGS: There is no evidence of fracture, dislocation, or joint effusion. There is no evidence of arthropathy or other focal bone abnormality. Soft tissues are unremarkable.  IMPRESSION: Negative exam.   Electronically Signed   By: Inge Rise M.D.   On: 12/16/2014 20:44   Ct Head Wo Contrast  12/16/2014   CLINICAL DATA:  Pain following fall  EXAM: CT HEAD WITHOUT CONTRAST  CT MAXILLOFACIAL WITHOUT CONTRAST  TECHNIQUE: Multidetector CT imaging of the head and maxillofacial structures were performed using the standard protocol without intravenous contrast. Multiplanar CT image reconstructions of the maxillofacial structures were also generated.  COMPARISON:  None.  FINDINGS: CT HEAD FINDINGS  There is age related volume loss. There is no intracranial mass, hemorrhage, extra-axial fluid collection, or midline shift. There is minimal small vessel disease in the centra semiovale bilaterally. Elsewhere gray-white compartments appear. No acute infarct evident. Bony calvarium appears intact. The mastoid air cells are clear. There is rightward deviation of the nasal septum.  CT MAXILLOFACIAL FINDINGS  There is no appreciable fracture or dislocation. Orbits appear symmetric and normal bilaterally. No intraorbital lesions are identified.  There is bony hypertrophy along the inferior alveolar ridge bilaterally  which appears benign.  There is mucosal thickening and each maxillary antrum, more on the left than on the right. There are small retention cysts in the left maxillary antrum. Other paranasal sinuses are clear. Ostiomeatal unit complexes are patent bilaterally. There is edema of the inferior nasal turbinate on the left. There is rightward deviation of the nasal septum with a bony spur toward the right. There is a small concha bullosa on the right, an anatomic variant.  Salivary glands appear symmetric and within normal limits. No adenopathy is appreciable. There is mild degenerative change in the visualized cervical spine.  IMPRESSION: CT head: Age related volume loss with minimal periventricular small vessel disease. No intracranial mass, hemorrhage, or extra-axial fluid collection. No acute appearing infarct evident.  CT maxillofacial: No fracture or dislocation. No intraorbital lesions. Paranasal sinus disease in the maxillary antra, more on the left than on the right. Rightward deviation of the nasal septum. Nasal turbinate edema on the left without nares obstruction. Ostiomeatal unit complexes patent bilaterally.   Electronically Signed   By: Lowella Grip III M.D.   On: 12/16/2014 20:39   Dg Knee Complete 4 Views Left  12/16/2014   CLINICAL DATA:  Status post fall today. Left knee pain. Initial encounter.  EXAM: LEFT KNEE - COMPLETE 4+ VIEW  COMPARISON:  None.  FINDINGS: There is no evidence of fracture, dislocation, or joint effusion. There is no evidence of arthropathy or other focal bone abnormality. Soft tissues are unremarkable.  IMPRESSION: Negative exam.   Electronically Signed   By: Inge Rise M.D.   On:  12/16/2014 20:46   Dg Hand Complete Left  12/16/2014   CLINICAL DATA:  74 year old male with right hand pain and abrasions after falling on the sidewalk earlier today  EXAM: LEFT HAND - COMPLETE 3+ VIEW  COMPARISON:  Concurrently obtained radiographs of the elbow and the  FINDINGS: No  evidence of acute fracture or malalignment. 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 No evidence of lytic or blastic osseous lesion.  IMPRESSION: No acute osseous abnormality.   Electronically Signed   By: Jacqulynn Cadet M.D.   On: 12/16/2014 20:44   Ct Maxillofacial Wo Cm  12/16/2014   CLINICAL DATA:  Pain following fall  EXAM: CT HEAD WITHOUT CONTRAST  CT MAXILLOFACIAL WITHOUT CONTRAST  TECHNIQUE: Multidetector CT imaging of the head and maxillofacial structures were performed using the standard protocol without intravenous contrast. Multiplanar CT image reconstructions of the maxillofacial structures were also generated.  COMPARISON:  None.  FINDINGS: CT HEAD FINDINGS  There is age related volume loss. There is no intracranial mass, hemorrhage, extra-axial fluid collection, or midline shift. There is minimal small vessel disease in the centra semiovale bilaterally. Elsewhere gray-white compartments appear. No acute infarct evident. Bony calvarium appears intact. The mastoid air cells are clear. There is rightward deviation of the nasal septum.  CT MAXILLOFACIAL FINDINGS  There is no appreciable fracture or dislocation. Orbits appear symmetric and normal bilaterally. No intraorbital lesions are identified.  There is bony hypertrophy along the inferior alveolar ridge bilaterally which appears benign.  There is mucosal thickening and each maxillary antrum, more on the left than on the right. There are small retention cysts in the left maxillary antrum. Other paranasal sinuses are clear. Ostiomeatal unit complexes are patent bilaterally. There is edema of the inferior nasal turbinate on the left. There is rightward deviation of the nasal septum with a bony spur toward the right. There is a small concha bullosa on the right, an anatomic variant.  Salivary glands appear symmetric and within normal limits. No adenopathy is appreciable. There is mild  degenerative change in the visualized cervical spine.  IMPRESSION: CT head: Age related volume loss with minimal periventricular small vessel disease. No intracranial mass, hemorrhage, or extra-axial fluid collection. No acute appearing infarct evident.  CT maxillofacial: No fracture or dislocation. No intraorbital lesions. Paranasal sinus disease in the maxillary antra, more on the left than on the right. Rightward deviation of the nasal septum. Nasal turbinate edema on the left without nares obstruction. Ostiomeatal unit complexes patent bilaterally.   Electronically Signed   By: Lowella Grip III M.D.   On: 12/16/2014 20:39   Current Outpatient Prescriptions on File Prior to Visit  Medication Sig Dispense Refill  . acyclovir (ZOVIRAX) 200 MG capsule Take 400 mg by mouth 2 (two) times daily.     . ASPIRIN LOW STRENGTH PO Take one by mouth three times a week    . Multiple Vitamins-Minerals (EQL PROTECTAVISION PO) Take by mouth.    . traMADol (ULTRAM) 50 MG tablet Take 1 tablet (50 mg total) by mouth every 6 (six) hours as needed. 20 tablet 0   No current facility-administered medications on file prior to visit.    Allergies  Allergen Reactions  . Demerol [Meperidine]     Past Medical History  Diagnosis Date  . Hyperlipidemia   . Osteoporosis     osteoarthritis  . Cataract 03/2009    left eye    Past Surgical History  Procedure  Laterality Date  . Thumb surgery  1999    right  . Eye surgery      Family History  Problem Relation Age of Onset  . Arthritis Father   . Sleep apnea Father   . Alcohol abuse Brother   . Cancer Paternal Grandfather     prostate  . Cancer Brother     cancer    Social History   Social History  . Marital Status: Married    Spouse Name: N/A  . Number of Children: 1  . 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 Smoking status: Never Smoker   . Smokeless  tobacco: Never Used  . Alcohol Use: No  . Drug Use: No  . Sexual Activity: Not on file   Other Topics Concern  . 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   Would not want Tube Feeds.   Does not have a living will or HPOA.   The PMH, PSH, Social History, Family History, Medications, and allergies have been reviewed in [facility], and have been updated if relevant.   Review of Systems  Constitutional: Negative.   HENT: Negative.   Eyes: Negative.   Respiratory: Negative.   Cardiovascular: Negative.   Gastrointestinal: Negative.   Endocrine: Negative.   Genitourinary: Negative.   Musculoskeletal: Negative.   Skin: Negative.   Allergic/Immunologic: Negative.   Neurological: Negative.   Hematological: Negative.   Psychiatric/Behavioral: Negative.   All other systems reviewed and are negative.      Objective:    BP 142/70 mmHg  Pulse 72  Temp(Src) 98.1 F (36.7 C) (Oral)  Wt 148 lb 8 oz (67.359 kg)  SpO2 97%   Physical Exam  Constitutional: He is oriented to person, place, and time. He appears well-developed and well-nourished. No distress.  HENT:  Abrasion left maxilla- healing well, no erythema or swelling  Eyes: Conjunctivae are normal.  Neck: Normal range of motion.  Cardiovascular: Normal rate.   Pulmonary/Chest: Effort normal.  Musculoskeletal: Normal range of motion.  Neurological: He is alert and oriented to person, place, and time. No cranial nerve deficit.  Skin: Skin is warm and dry.  Abrasion left lateral elbow- no erythema or drainage  5 sutures c/d/i- 2 cm laceration left mid palm  Psychiatric: He has a normal mood and affect. His behavior is normal. Judgment and thought content normal.  Nursing note and vitals reviewed.         Assessment & Plan:   Fall involving sidewalk curb, subsequent encounter  Visit for suture removal No Follow-up  on file.

## 2014-12-26 NOTE — Assessment & Plan Note (Signed)
ER notes reviewed. Injuries healing well. Call or return to clinic prn if these symptoms worsen or fail to improve as anticipated. The patient indicates understanding of these issues and agrees with the plan.

## 2015-07-18 ENCOUNTER — Encounter: Payer: Self-pay | Admitting: Primary Care

## 2015-07-18 ENCOUNTER — Ambulatory Visit (INDEPENDENT_AMBULATORY_CARE_PROVIDER_SITE_OTHER): Payer: Medicare Other | Admitting: Primary Care

## 2015-07-18 VITALS — BP 142/76 | HR 86 | Temp 98.1°F | Ht 66.5 in | Wt 154.8 lb

## 2015-07-18 DIAGNOSIS — R05 Cough: Secondary | ICD-10-CM | POA: Diagnosis not present

## 2015-07-18 DIAGNOSIS — R059 Cough, unspecified: Secondary | ICD-10-CM

## 2015-07-18 MED ORDER — GUAIFENESIN-CODEINE 100-10 MG/5ML PO SYRP: 5.0000 mL | ORAL_SOLUTION | Freq: Every evening | ORAL | Status: DC | PRN

## 2015-07-18 NOTE — Progress Notes (Signed)
Subjective:    Patient ID: Bryan Avila, male    DOB: 09-25-1940, 75 y.o.   MRN: [id number]  HPI  Mr. Delbene is a 75 year old male who presents today with a chief complaint of nasal congestion. He also reports cough and chest congestion. His cough is mild and non productive. His symptoms have been present for 2 days. He's taking Mucinex DM without ability to cough up mucous. Denies sick contacts, fevers, body aches. His wife reports his cough was "deep" last night.  Review of Systems  Constitutional: Positive for chills. Negative for fever and fatigue.  HENT: Positive for congestion and sore throat. Negative for sinus pressure.   Respiratory: Positive for cough. Negative for shortness of breath.   Cardiovascular: Negative for chest pain.  Neurological: Positive for headaches.       Past Medical History  Diagnosis Date  . Hyperlipidemia   . Osteoporosis     osteoarthritis  . Cataract 03/2009    left eye    Social History   Social History  . Marital Status: Married    Spouse Name: N/A  . Number of Children: 1  . 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 Smoking status: Never Smoker   . Smokeless tobacco: Never Used  . Alcohol Use: No  . Drug Use: No  . Sexual Activity: Not on file   Other Topics Concern  . 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   Would not want Tube Feeds.   Does not have a living will or HPOA.    Past Surgical History  Procedure Laterality Date  . Thumb surgery  1999    right  . Eye surgery      Family History  Problem Relation Age of Onset  . Arthritis Father   . Sleep apnea Father   . Alcohol abuse Brother   . Cancer Paternal Grandfather     prostate  . Cancer Brother     cancer    Allergies  Allergen Reactions  . Demerol [Meperidine]      Current Outpatient Prescriptions on File Prior to Visit  Medication Sig Dispense Refill  . ASPIRIN LOW STRENGTH PO Take one by mouth three times a week    . Multiple Vitamins-Minerals (EQL PROTECTAVISION PO) Take by mouth.    . traMADol (ULTRAM) 50 MG tablet Take 1 tablet (50 mg total) by mouth every 6 (six) hours as needed. 20 tablet 0   No current facility-administered medications on file prior to visit.    BP 142/76 mmHg  Pulse 86  Temp(Src) 98.1 F (36.7 C) (Oral)  Ht 5' 6.5" (1.689 m)  Wt 154 lb 12.8 oz (70.217 kg)  BMI 24.61 kg/m2  SpO2 98%    Objective:   Physical Exam  Constitutional: He appears well-nourished. He does not appear ill.  HENT:  Right Ear: Tympanic membrane and ear canal normal.  Left Ear: Tympanic membrane and ear canal normal.  Nose: Right sinus exhibits no maxillary sinus tenderness and no frontal sinus tenderness. Left sinus exhibits no maxillary sinus tenderness and no frontal sinus tenderness.  Mouth/Throat: Oropharynx is clear and moist.  Eyes: Conjunctivae are normal.  Neck: Neck supple.  Cardiovascular: Normal rate.   Pulmonary/Chest: Effort normal and breath  sounds normal. He has no wheezes. He has no rales.  Lymphadenopathy:    He has no cervical adenopathy.  Skin: Skin is warm and dry.          Assessment & Plan:  URI:  Cough, chills, headache, nasal congestion x 2 days. No improvement with mucinex. Exam with clear lungs and is unremarkable overall. Does not appear ill. Suspect viral involvement and will treat with supportive measures. Flonase, mucinex, Cheratussin AC PRN. Return precautions provided

## 2015-07-18 NOTE — Patient Instructions (Signed)
Continue Mucinex tablets every 12 hours as discussed.  Nasal Congestion: Try using Flonase (fluticasone) nasal spray. Instill 2 sprays in each nostril once daily.   Night Cough: Try the Cheratussin AC with codeine for cough at night. Take 5 mls at bedtime as needed.  Increase consumption of fluids and rest.   Please notify me if you develop persistent fevers of 101, start coughing up green mucous, notice increased fatigue or weakness, or feel worse after 1 week of onset of symptoms.   It was a pleasure meeting you!  Upper Respiratory Infection, Adult Most upper respiratory infections (URIs) are a viral infection of the air passages leading to the lungs. A URI affects the nose, throat, and upper air passages. The most common type of URI is nasopharyngitis and is typically referred to as "the common cold." URIs run their course and usually go away on their own. Most of the time, a URI does not require medical attention, but sometimes a bacterial infection in the upper airways can follow a viral infection. This is called a secondary infection. Sinus and middle ear infections are common types of secondary upper respiratory infections. Bacterial pneumonia can also complicate a URI. A URI can worsen asthma and chronic obstructive pulmonary disease (COPD). Sometimes, these complications can require emergency medical care and may be life threatening.  CAUSES Almost all URIs are caused by viruses. A virus is a type of germ and can spread from one person to another.  RISKS FACTORS You may be at risk for a URI if:   You smoke.   You have chronic heart or lung disease.  You have a weakened defense (immune) system.   You are very young or very old.   You have nasal allergies or asthma.  You work in crowded or poorly ventilated areas.  You work in health care facilities or schools. SIGNS AND SYMPTOMS  Symptoms typically develop 2-3 days after you come in contact with a cold virus. Most viral  URIs last 7-10 days. However, viral URIs from the influenza virus (flu virus) can last 14-18 days and are typically more severe. Symptoms may include:   Runny or stuffy (congested) nose.   Sneezing.   Cough.   Sore throat.   Headache.   Fatigue.   Fever.   Loss of appetite.   Pain in your forehead, behind your eyes, and over your cheekbones (sinus pain).  Muscle aches.  DIAGNOSIS  Your health care provider may diagnose a URI by:  Physical exam.  Tests to check that your symptoms are not due to another condition such as:  Strep throat.  Sinusitis.  Pneumonia.  Asthma. TREATMENT  A URI goes away on its own with time. It cannot be cured with medicines, but medicines may be prescribed or recommended to relieve symptoms. Medicines may help:  Reduce your fever.  Reduce your cough.  Relieve nasal congestion. HOME CARE INSTRUCTIONS   Take medicines only as directed by your health care provider.   Gargle warm saltwater or take cough drops to comfort your throat as directed by your health care provider.  Use a warm mist humidifier or inhale steam from a shower to increase air moisture. This may make it easier to breathe.  Drink enough fluid to keep your urine clear or pale yellow.   Eat soups and other clear broths and maintain good nutrition.   Rest as needed.   Return to work when your temperature has returned to normal or as your health care  provider advises. You may need to stay home longer to avoid infecting others. You can also use a face mask and careful hand washing to prevent spread of the virus.  Increase the usage of your inhaler if you have asthma.   Do not use any tobacco products, including cigarettes, chewing tobacco, or electronic cigarettes. If you need help quitting, ask your health care provider. PREVENTION  The best way to protect yourself from getting a cold is to practice good hygiene.   Avoid oral or hand contact with people  with cold symptoms.   Wash your hands often if contact occurs.  There is no clear evidence that vitamin C, vitamin E, echinacea, or exercise reduces the chance of developing a cold. However, it is always recommended to get plenty of rest, exercise, and practice good nutrition.  SEEK MEDICAL CARE IF:   You are getting worse rather than better.   Your symptoms are not controlled by medicine.   You have chills.  You have worsening shortness of breath.  You have brown or red mucus.  You have yellow or brown nasal discharge.  You have pain in your face, especially when you bend forward.  You have a fever.  You have swollen neck glands.  You have pain while swallowing.  You have white areas in the back of your throat. SEEK IMMEDIATE MEDICAL CARE IF:   You have severe or persistent:  Headache.  Ear pain.  Sinus pain.  Chest pain.  You have chronic lung disease and any of the following:  Wheezing.  Prolonged cough.  Coughing up blood.  A change in your usual mucus.  You have a stiff neck.  You have changes in your:  Vision.  Hearing.  Thinking.  Mood. MAKE SURE YOU:   Understand these instructions.  Will watch your condition.  Will get help right away if you are not doing well or get worse.   This information is not intended to replace advice given to you by your health care provider. Make sure you discuss any questions you have with your health care provider.   Document Released: 10/20/2000 Document Revised: 09/10/2014 Document Reviewed: 08/01/2013 Elsevier Interactive Patient Education [employer].

## 2015-07-18 NOTE — Progress Notes (Signed)
Pre visit review using our clinic review tool, if applicable. No additional management support is needed unless otherwise documented below in the visit note. 

## 2015-11-20 ENCOUNTER — Other Ambulatory Visit: Payer: Self-pay | Admitting: Family Medicine

## 2015-11-20 ENCOUNTER — Other Ambulatory Visit (INDEPENDENT_AMBULATORY_CARE_PROVIDER_SITE_OTHER): Payer: Medicare Other

## 2015-11-20 ENCOUNTER — Encounter: Payer: Medicare Other | Admitting: Family Medicine

## 2015-11-20 DIAGNOSIS — Z0000 Encounter for general adult medical examination without abnormal findings: Secondary | ICD-10-CM

## 2015-11-20 DIAGNOSIS — Z125 Encounter for screening for malignant neoplasm of prostate: Secondary | ICD-10-CM

## 2015-11-20 DIAGNOSIS — E7800 Pure hypercholesterolemia, unspecified: Secondary | ICD-10-CM

## 2015-11-20 LAB — CBC WITH DIFFERENTIAL/PLATELET
Basophils Absolute: 0.1 10*3/uL (ref 0.0–0.1)
Basophils Relative: 0.6 % (ref 0.0–3.0)
Eosinophils Absolute: 0.3 10*3/uL (ref 0.0–0.7)
Eosinophils Relative: 3.7 % (ref 0.0–5.0)
HCT: 45.3 % (ref 39.0–52.0)
Hemoglobin: 15.1 g/dL (ref 13.0–17.0)
LYMPHS ABS: 1.7 10*3/uL (ref 0.7–4.0)
Lymphocytes Relative: 20.9 % (ref 12.0–46.0)
MCHC: 33.4 g/dL (ref 30.0–36.0)
MCV: 92.2 fl (ref 78.0–100.0)
Monocytes Absolute: 0.8 10*3/uL (ref 0.1–1.0)
Monocytes Relative: 9.7 % (ref 3.0–12.0)
NEUTROS ABS: 5.3 10*3/uL (ref 1.4–7.7)
Neutrophils Relative %: 65.1 % (ref 43.0–77.0)
PLATELETS: 256 10*3/uL (ref 150.0–400.0)
RBC: 4.91 Mil/uL (ref 4.22–5.81)
RDW: 14.7 % (ref 11.5–15.5)
WBC: 8.1 10*3/uL (ref 4.0–10.5)

## 2015-11-20 LAB — COMPREHENSIVE METABOLIC PANEL
ALK PHOS: 79 U/L (ref 39–117)
ALT: 12 U/L (ref 0–53)
AST: 15 U/L (ref 0–37)
Albumin: 4.1 g/dL (ref 3.5–5.2)
BUN: 16 mg/dL (ref 6–23)
CALCIUM: 9.8 mg/dL (ref 8.4–10.5)
CO2: 33 mEq/L — ABNORMAL HIGH (ref 19–32)
Chloride: 105 mEq/L (ref 96–112)
Creatinine, Ser: 0.83 mg/dL (ref 0.40–1.50)
GFR: 96.04 mL/min (ref >60.00)
GLUCOSE: 94 mg/dL (ref 70–99)
POTASSIUM: 4.3 meq/L (ref 3.5–5.1)
Sodium: 141 mEq/L (ref 135–145)
TOTAL PROTEIN: 6.8 g/dL (ref 6.0–8.3)
Total Bilirubin: 0.7 mg/dL (ref 0.2–1.2)

## 2015-11-20 LAB — LIPID PANEL
Cholesterol: 191 mg/dL (ref 0–200)
HDL: 66.6 mg/dL (ref >39.00)
LDL Cholesterol: 114 mg/dL — ABNORMAL HIGH (ref 0–99)
NONHDL: 124.54
TRIGLYCERIDES: 55 mg/dL (ref 0.0–149.0)
Total CHOL/HDL Ratio: 3
VLDL: 11 mg/dL (ref 0.0–40.0)

## 2015-11-20 LAB — PSA: PSA: 0.87 ng/mL (ref 0.10–4.00)

## 2015-11-25 ENCOUNTER — Encounter: Payer: BC Managed Care – PPO | Admitting: Family Medicine

## 2015-11-25 ENCOUNTER — Encounter: Payer: Self-pay | Admitting: Family Medicine

## 2015-11-25 ENCOUNTER — Ambulatory Visit (INDEPENDENT_AMBULATORY_CARE_PROVIDER_SITE_OTHER): Payer: Medicare Other | Admitting: Family Medicine

## 2015-11-25 VITALS — BP 114/56 | HR 64 | Temp 97.5°F | Ht 66.25 in | Wt 150.2 lb

## 2015-11-25 DIAGNOSIS — M19019 Primary osteoarthritis, unspecified shoulder: Secondary | ICD-10-CM | POA: Diagnosis not present

## 2015-11-25 DIAGNOSIS — Z0000 Encounter for general adult medical examination without abnormal findings: Secondary | ICD-10-CM | POA: Diagnosis not present

## 2015-11-25 NOTE — Progress Notes (Signed)
Patient ID: Bryan Avila, male    DOB: 1940-09-04, 75 y.o.   MRN: [id number]  HPI Comments: Bryan Avila is a very pleasant 75 year old gentleman here  for medicare annual wellness visit and follow up of chronic medical conditions.  I have personally reviewed the Medicare Annual Wellness questionnaire and have noted 1. The patient's medical and social history 2. Their use of alcohol, tobacco or illicit drugs 3. Their current medications and supplements 4. The patient's functional ability including ADL's, fall risks, home safety risks and hearing or visual             impairment. 5. Diet and physical activities 6. Evidence for depression or mood disorders  End of life wishes discussed and updated in Social History.  Doing well.  Walks every day. Feels great.  Has no complaints.  The roster of all physicians providing medical care to patient - is listed in the Snapshot section of the chart.  Pneumovax 03/31/07 Prevnar 13 11/19/13 Td 10/28/06 Colonoscopy 01/06/08 (Dr. Collene Mares)- 10 year recall Zoster 03/2010  Has had hearing loss for years but feels he cannot afford hearing aids.  Ses dermatologist yearly (Dr. Delman Cheadle).  Sees optho twice a year- Dr. Ellie Lunch.  Takes only ASA daily.  .  Wt Readings from Last 3 Encounters:  11/25/15 150 lb 4 oz (68.153 kg)  07/18/15 154 lb 12.8 oz (70.217 kg)  12/26/14 148 lb 8 oz (67.359 kg)    Lab Results  Component Value Date   CHOL 191 11/20/2015   HDL 66.60 11/20/2015   LDLCALC 114* 11/20/2015   TRIG 55.0 11/20/2015   CHOLHDL 3 11/20/2015   Lab Results  Component Value Date   PSA 0.87 11/20/2015   PSA 0.68 11/21/2014   PSA 0.63 11/19/2013    Patient Active Problem List   Diagnosis Date Noted  . Medicare annual wellness visit, subsequent 11/20/2015  . Right bundle branch block 09/10/2010  . 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 HYPERCHOLESTEROLEMIA, PURE 10/26/2006  . Bilateral hearing loss 10/26/2006  . Osteoarthritis  10/26/2006   Past Medical History  Diagnosis Date  . Hyperlipidemia   . Osteoporosis     osteoarthritis  . Cataract 03/2009    left eye   Past Surgical History  Procedure Laterality Date  . Thumb surgery  1999    right  . Eye surgery     Social History  Substance Use Topics  . Smoking status: Never Smoker   . Smokeless tobacco: Never Used  . Alcohol Use: No   Family History  Problem Relation Age of Onset  . Arthritis Father   . Sleep apnea Father   . Alcohol abuse Brother   . Cancer Paternal Grandfather     prostate  . Cancer Brother     cancer   Allergies  Allergen Reactions  . Demerol [Meperidine]    Current Outpatient Prescriptions on File Prior to Visit  Medication Sig Dispense Refill  . acyclovir (ZOVIRAX) 400 MG tablet Take 400 mg by mouth 2 (two) times daily.  99  . ASPIRIN LOW STRENGTH PO Take one by mouth three times a week    . guaiFENesin-codeine (CHERATUSSIN AC) 100-10 MG/5ML syrup Take 5 mLs by mouth at bedtime as needed for cough. 50 mL 0  . Multiple Vitamins-Minerals (EQL PROTECTAVISION PO) Take by mouth.    . traMADol (ULTRAM) 50 MG tablet Take 1 tablet (50 mg total) by mouth every 6 (six) hours as needed. 20 tablet 0  No current facility-administered medications on file prior to visit.   The PMH, PSH, Social History, Family History, Medications, and allergies have been reviewed in [facility], and have been updated if relevant.   Review of Systems  Constitutional: Negative for unexpected weight change.  HENT: Negative.   Eyes: Negative.   Respiratory: Negative.   Cardiovascular: Negative.   Gastrointestinal: Negative.   Endocrine: Negative.   Genitourinary: Negative.   Musculoskeletal: Negative.   Allergic/Immunologic: Negative.   Neurological: Negative.   Hematological: Negative.   Psychiatric/Behavioral: Negative.   All other systems reviewed and are negative.      Physical Exam  BP 114/56 mmHg  Pulse 64  Temp(Src) 97.5 F (36.4 C)  (Oral)  Ht 5' 6.25" (1.683 m)  Wt 150 lb 4 oz (68.153 kg)  BMI 24.06 kg/m2  SpO2 98%  Wt Readings from Last 3 Encounters:  11/25/15 150 lb 4 oz (68.153 kg)  07/18/15 154 lb 12.8 oz (70.217 kg)  12/26/14 148 lb 8 oz (67.359 kg)     Constitutional: He is oriented to person, place, and time. He appears well-developed and well-nourished.  Hard of hearing- baseline HENT: TMs clear bilaterally Head: Normocephalic.  Nose: Nose normal.  Mouth/Throat: Oropharynx is clear and moist.  Eyes: Conjunctivae are normal. Pupils are equal, round, and reactive to light.  Neck: Normal range of motion. Neck supple. No JVD present.  Cardiovascular: Normal rate, regular rhythm, S1 normal, S2 normal, normal heart sounds and intact distal pulses.  Exam reveals no gallop and no friction rub.   No murmur heard. Pulmonary/Chest: Effort normal and breath sounds normal. No respiratory distress. He has no wheezes. He has no rales. He exhibits no tenderness.  Abdominal: Soft. Bowel sounds are normal. He exhibits no distension. There is no tenderness.  Musculoskeletal: Normal range of motion. He exhibits no edema and no tenderness.  Lymphadenopathy:    He has no cervical adenopathy.  Neurological: He is alert and oriented to person, place, and time. Coordination normal.  Skin: Skin is warm and dry. No rash noted. No erythema.  Psychiatric: He has a normal mood and affect. His behavior is normal. Judgment and thought content normal.

## 2015-11-25 NOTE — Patient Instructions (Signed)
Good to see you. You are not due for a colonoscopy until after 01/05/18  Happy birthday!

## 2015-11-25 NOTE — Progress Notes (Signed)
Pre visit review using our clinic review tool, if applicable. No additional management support is needed unless otherwise documented below in the visit note. 

## 2015-11-25 NOTE — Addendum Note (Signed)
Addended by: Lucille Passy on: 11/25/2015 12:21 PM   Modules accepted: Miquel Dunn

## 2015-11-25 NOTE — Assessment & Plan Note (Signed)
The patients weight, height, BMI and visual acuity have been recorded in the chart.  Cognitive function assessed.   I have made referrals, counseling and provided education to the patient based review of the above and I have provided the pt with a written personalized care plan for preventive services.  

## 2015-12-03 ENCOUNTER — Encounter: Payer: Self-pay | Admitting: Family Medicine

## 2015-12-03 LAB — COLOGUARD: Cologuard: NEGATIVE

## 2015-12-25 ENCOUNTER — Telehealth: Payer: Self-pay | Admitting: *Deleted

## 2015-12-25 NOTE — Telephone Encounter (Signed)
Spoke to pts wife and advised that results have not been received as of yet. Informed her that if neg we will mail a letter indicating and if positive he will receive a call to discuss further imaging

## 2015-12-25 NOTE — Telephone Encounter (Signed)
Geri called and left message that pt mailed cologuard kit in 3 weeks ago, and hasn't received results yet. She request to be called with results, she says it's ok to leave message on voicemail of home #.

## 2016-02-19 NOTE — Telephone Encounter (Signed)
Geri left v/m that she had spoken with Levada Dy at cologuard and results were faxed to [place]. Geri ask that cologuard be called at provider support team and another copy will be faxed to Dr Deborra Medina.

## 2016-02-23 NOTE — Telephone Encounter (Signed)
Spoke to [place] at [employer] who states results were negative and she will have them faxed over directly.

## 2016-02-25 NOTE — Telephone Encounter (Signed)
Spoke to pt and informed him of cologuard results

## 2016-04-20 ENCOUNTER — Telehealth: Payer: Self-pay | Admitting: Family Medicine

## 2016-04-20 ENCOUNTER — Encounter: Payer: Self-pay | Admitting: Internal Medicine

## 2016-04-20 ENCOUNTER — Ambulatory Visit (INDEPENDENT_AMBULATORY_CARE_PROVIDER_SITE_OTHER): Payer: Medicare Other | Admitting: Internal Medicine

## 2016-04-20 VITALS — BP 122/60 | HR 71 | Temp 99.5°F | Wt 152.5 lb

## 2016-04-20 DIAGNOSIS — D693 Immune thrombocytopenic purpura: Secondary | ICD-10-CM

## 2016-04-20 LAB — CBC WITH DIFFERENTIAL/PLATELET
BASOS PCT: 0.2 % (ref 0.0–3.0)
Basophils Absolute: 0 10*3/uL (ref 0.0–0.1)
EOS PCT: 0.7 % (ref 0.0–5.0)
Eosinophils Absolute: 0.1 10*3/uL (ref 0.0–0.7)
HCT: 40.5 % (ref 39.0–52.0)
Hemoglobin: 13.6 g/dL (ref 13.0–17.0)
LYMPHS ABS: 0.7 10*3/uL (ref 0.7–4.0)
Lymphocytes Relative: 9.4 % — ABNORMAL LOW (ref 12.0–46.0)
MCHC: 33.6 g/dL (ref 30.0–36.0)
MCV: 90.2 fl (ref 78.0–100.0)
MONOS PCT: 10.8 % (ref 3.0–12.0)
Monocytes Absolute: 0.8 10*3/uL (ref 0.1–1.0)
NEUTROS PCT: 78.9 % — AB (ref 43.0–77.0)
Neutro Abs: 5.6 10*3/uL (ref 1.4–7.7)
Platelets: 160 10*3/uL (ref 150.0–400.0)
RBC: 4.48 Mil/uL (ref 4.22–5.81)
RDW: 14.4 % (ref 11.5–15.5)
WBC: 7.1 10*3/uL (ref 4.0–10.5)

## 2016-04-20 NOTE — Progress Notes (Signed)
Subjective:    Patient ID: Bryan Avila, male    DOB: 1940/06/17, 75 y.o.   MRN: [id number]  HPI  Pt presents to the clinic today with c/o a rash. He reports this started on his bilateral lower legs, 3 days ago. The rash is itchy, red and swollen. It has spread up to his thighs. He has noticed some associated lower extremity swelling. He denies changes is diet, medication, soap, lotion or detergent. He did go to a birthday party and had some birthday cake, but no one else had a reaction like this. He has not tried anything OTC for this.  Review of Systems      Past Medical History:  Diagnosis Date  . Cataract 03/2009   left eye  . Hyperlipidemia   . Osteoporosis    osteoarthritis    Current Outpatient Prescriptions  Medication Sig Dispense Refill  . acyclovir (ZOVIRAX) 400 MG tablet Take 400 mg by mouth 2 (two) times daily.  99  . ASPIRIN LOW STRENGTH PO Take one by mouth three times a week     No current facility-administered medications for this visit.     Allergies  Allergen Reactions  . Demerol [Meperidine]     Family History  Problem Relation Age of Onset  . Arthritis Father   . Sleep apnea Father   . Alcohol abuse Brother   . Cancer Paternal Grandfather     prostate  . Cancer Brother     cancer    Social History   Social History  . Marital status: Married    Spouse name: N/A  . Number of children: 1  . 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 Smoking status: Never Smoker  . Smokeless tobacco: Never Used  . Alcohol use No  . Drug use: No  . Sexual activity: Not on file   Other Topics Concern  . 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   Would not want Tube Feeds.   Does not have a living will or HPOA.      Skin: Pt reports rash on legs. Denies  redness, rashes, lesions or ulcercations.    No other specific complaints in a complete review of systems (except as listed in HPI above).  Objective:   Physical Exam  BP 122/60   Pulse 71   Temp 99.5 F (37.5 C) (Oral)   Wt 152 lb 8 oz (69.2 kg)   SpO2 97%   BMI 24.43 kg/m  Wt Readings from Last 3 Encounters:  04/20/16 152 lb 8 oz (69.2 kg)  11/25/15 150 lb 4 oz (68.2 kg)  07/18/15 154 lb 12.8 oz (70.2 kg)    General: Appears his stated age, well developed, well nourished in NAD. Skin: Scattered, nonblanchable, maculopapular rash noted on BLE.  BMET    Component Value Date/Time   NA 141 11/20/2015 1004   K 4.3 11/20/2015 1004   CL 105 11/20/2015 1004   CO2 33 (H) 11/20/2015 1004   GLUCOSE 94 11/20/2015 1004   BUN 16 11/20/2015 1004   CREATININE 0.83 11/20/2015 1004   CALCIUM 9.8 11/20/2015 1004   GFRNONAA 106.53 10/30/2009 0809   GFRAA  03/01/2009 2114    >60        The  eGFR has been calculated using the MDRD equation. This calculation has not been validated in all clinical situations. eGFR's persistently <60 mL/min signify possible Chronic Kidney Disease.    Lipid Panel     Component Value Date/Time   CHOL 191 11/20/2015 1004   TRIG 55.0 11/20/2015 1004   HDL 66.60 11/20/2015 1004   CHOLHDL 3 11/20/2015 1004   VLDL 11.0 11/20/2015 1004   LDLCALC 114 (H) 11/20/2015 1004    CBC    Component Value Date/Time   WBC 8.1 11/20/2015 1004   RBC 4.91 11/20/2015 1004   HGB 15.1 11/20/2015 1004   HCT 45.3 11/20/2015 1004   PLT 256.0 11/20/2015 1004   MCV 92.2 11/20/2015 1004   MCHC 33.4 11/20/2015 1004   RDW 14.7 11/20/2015 1004   LYMPHSABS 1.7 11/20/2015 1004   MONOABS 0.8 11/20/2015 1004   EOSABS 0.3 11/20/2015 1004   BASOSABS 0.1 11/20/2015 1004    Hgb A1C No results found for: HGBA1C         Assessment & Plan:   Acute ITP:  Stop Aspirin for now CBC with diff today Will follow up after labs to discuss additional treatment plan if  needed Case discussed with Dr. Deborra Medina  Will follow up after labs, RTC as needed Webb Silversmith, NP

## 2016-04-20 NOTE — Telephone Encounter (Signed)
[facility]  Patient Name: Nathanial Elenbaas  DOB: 1941/01/09    Initial Comment Caller's husband was diagnosed with Idiopathic Thrombocytotenic Purtura today at the office. Is feeling nauseous and weak now, is wondering if this normal with this diagnosis. Had bloodwork done today.    Nurse Assessment  Nurse: Harlow Mares, RN, Suanne Marker Date/Time Eilene Ghazi Time): 04/20/2016 5:57:28 PM  Confirm and document reason for call. If symptomatic, describe symptoms. ---Caller's husband was diagnosed with Idiopathic Thrombocytotenic Purtura today at the office. Is feeling nauseous and weak now, is wondering if this normal with this diagnosis. Had bloodwork done today.  Does the patient have any new or worsening symptoms? ---Yes  Will a triage be completed? ---Yes  Related visit to physician within the last 2 weeks? ---Yes  Does the PT have any chronic conditions? (i.e. diabetes, asthma, etc.) ---Yes  List chronic conditions. ---ITP  Is this a behavioral health or substance abuse call? ---No     Guidelines    Guideline Title Affirmed Question Affirmed Notes  Weakness (Generalized) and Fatigue Poor fluid intake probably caused the weakness (all triage questions negative)    Final Disposition User   [place], RN, Suanne Marker    Disagree/Comply: Leta Baptist

## 2016-04-20 NOTE — Patient Instructions (Signed)
Idiopathic Thrombocytopenic Purpura Introduction Idiopathic thrombocytopenic purpura (ITP) is a disease in which your body's defense system (immune system) attacks your platelets. Platelets are blood cells that help form clots and seal leaks in damaged blood vessels. With ITP, you to have too few platelets. As a result, you bleed more easily. It is also harder for your body to stop any bleeding. In adults, ITP is usually a long-term disease. What are the causes? The cause is unknown. ITP may develop after a viral infection, during pregnancy, or from an immune disorder. What increases the risk? The risk of ITP may be greater among:  Women.  Adults 41-14 years old. What are the signs or symptoms? Common signs and symptoms include:  Easy bruising.  A cut that bleeds for a long time.  Tiny purple blood dots (petechiae) under the skin, especially on the shins.  Blood in urine or bowel movements.  Nosebleeds.  Bleeding gums.  Heavy menstrual periods. Mild forms of ITP may not cause any symptoms. How is this diagnosed? Your health care provider may suspect ITP based on your signs and symptoms. To make a diagnosis, your health care provider may do a physical exam and order blood tests to:  Find how many platelets you have.  See how well your blood clots. Your health care provider may then do blood tests or a bone marrow test to rule out other conditions that could be causing your symptoms. How is this treated? Treatment depends on the severity of your condition. Options include:  Monitoring of your condition and platelet count.  Blood transfusions of antibodies or platelets.  Medicines such as:  Strong anti-inflammatory medicines (steroids).  Medicines to boost platelet production.  Medicines to suppress your immune system.  Removal of your spleen. This may be done if other treatments do not work. Follow these instructions at home:  Take medicines only as directed by  your health care provider.  Do not take over-the-counter medicines that lower your platelet count, affect platelet function, or affect your blood's ability to clot. These include aspirin and ibuprofen.  Do not participate in contact sports or other high-risk activities. Ask your health care provider which activities are safe for you.  Keep all follow-up visits as directed by your health care provider. This is important. Contact a health care provider if:  You have new symptoms.  Your symptoms get worse. Get help right away if:  You have a sudden severe headache or confusion.  You have significant bleeding.  You have nausea and vomiting. This information is not intended to replace advice given to you by your health care provider. Make sure you discuss any questions you have with your health care provider. Document Released: 11/21/2013 Document Revised: 10/02/2015 Document Reviewed: 09/13/2013  2017 Elsevier

## 2016-04-21 NOTE — Telephone Encounter (Signed)
PLEASE NOTE: All timestamps contained within this report are represented as [place] Standard Time. CONFIDENTIALTY NOTICE: This fax transmission is intended only for the addressee. It contains information that is legally privileged, confidential or otherwise protected from use or disclosure. If you are not the intended recipient, you are strictly prohibited from reviewing, disclosing, copying using or disseminating any of this information or taking any action in reliance on or regarding this information. If you have received this fax in error, please notify us immediately by telephone so that we can arrange for its return to [place]. Phone: [phone], Toll-Free: [phone], Fax: [phone] [place]: 1 of 2 Call Id: [id number] [place] Patient Name: Bryan Avila Gender: Male DOB: 05-Nov-1940 Age: 75 Y 3 M 6 D Return Phone Number: [phone] (Primary), [phone] (Secondary) Address[place] [address] Client [place] [facility] - Client Client Site [place] - Day Physician Webb Silversmith - NP Contact Type Call Who Is Calling Patient / Member / Family / Caregiver Call Type Triage / Clinical Caller Name Matt Engen Relationship To Patient Spouse Return Phone Number [phone] (Primary) Chief Complaint Nausea Reason for Call Symptomatic / Request for Health Information Initial Comment Caller's husband was diagnosed with Idiopathic Thrombocytotenic Purtura today at the office. Is feeling nauseous and weak now, is wondering if this normal with this diagnosis. Had bloodwork done today. Appointment Disposition EMR Appointment Not Necessary Info pasted into Epic Yes PreDisposition Did not know what to do Translation No Nurse Assessment Nurse: Harlow Mares, RN, Suanne Marker Date/Time Eilene Ghazi Time): 04/20/2016 5:57:28 PM Confirm and document reason for  call. If symptomatic, describe symptoms. ---Caller's husband was diagnosed with Idiopathic Thrombocytotenic Purtura today at the office. Is feeling nauseous and weak now, is wondering if this normal with this diagnosis. Had bloodwork done today. Does the patient have any new or worsening symptoms? ---Yes Will a triage be completed? ---Yes Related visit to physician within the last 2 weeks? ---Yes Does the PT have any chronic conditions? (i.e. diabetes, asthma, etc.) ---Yes List chronic conditions. ---ITP Is this a behavioral health or substance abuse call? ---No Guidelines Guideline Title Affirmed Question Affirmed Notes Nurse Date/Time (Eastern Time) Weakness (Generalized) and Fatigue Poor fluid intake probably caused the weakness (all triage questions negative) Harlow Mares, RN, Suanne Marker 04/20/2016 5:58:55 PM PLEASE NOTE: All timestamps contained within this report are represented as [place] Standard Time. CONFIDENTIALTY NOTICE: This fax transmission is intended only for the addressee. It contains information that is legally privileged, confidential or otherwise protected from use or disclosure. If you are not the intended recipient, you are strictly prohibited from reviewing, disclosing, copying using or disseminating any of this information or taking any action in reliance on or regarding this information. If you have received this fax in error, please notify us immediately by telephone so that we can arrange for its return to [place]. Phone: [phone], Toll-Free: [phone], Fax: [phone] [place]: 2 of 2 Call Id: [id number] [place]. Time Eilene Ghazi Time) Disposition Final User 04/20/2016 6:01:07 PM Home Care Yes Harlow Mares, RN, Rosalyn Charters Understands: Yes Disagree/Comply: Comply Care Advice Given Per Guideline HOME CARE: You should be able to treat this at home. REASSURANCE: * Not drinking enough fluids and being a little dehydrated is a common cause of mild weakness. FLUIDS: Drink several  glasses of fruit juice, other clear fluids or water. This will improve hydration and blood glucose. REST: Lie down with feet elevated for  1 hour. This will improve circulation and increase blood flow to the brain. CALL BACK IF: * Still feeling weak after 2 hours of rest and fluids * Passes out (faints) * You become worse. CARE ADVICE given per Weakness and Fatigue (Adult) guideline.

## 2016-04-21 NOTE — Telephone Encounter (Signed)
Please call to check on Mr. Guardado.

## 2016-04-21 NOTE — Telephone Encounter (Signed)
Lm on pts vm requesting a call back. Pls advised on pts question in [place] note .

## 2016-04-24 ENCOUNTER — Other Ambulatory Visit (HOSPITAL_COMMUNITY)
Admission: RE | Admit: 2016-04-24 | Discharge: 2016-04-24 | Disposition: A | Discharge status: Home / Self Care | Payer: Medicare Other | Source: Ambulatory Visit | Attending: Family Medicine | Admitting: Family Medicine

## 2016-04-24 ENCOUNTER — Ambulatory Visit (INDEPENDENT_AMBULATORY_CARE_PROVIDER_SITE_OTHER): Payer: Medicare Other | Admitting: Family Medicine

## 2016-04-24 ENCOUNTER — Encounter: Payer: Self-pay | Admitting: Family Medicine

## 2016-04-24 ENCOUNTER — Telehealth: Payer: Self-pay | Admitting: Family Medicine

## 2016-04-24 VITALS — BP 110/70 | HR 77 | Temp 97.7°F | Resp 17 | Ht 66.25 in | Wt 151.8 lb

## 2016-04-24 DIAGNOSIS — D692 Other nonthrombocytopenic purpura: Secondary | ICD-10-CM

## 2016-04-24 DIAGNOSIS — L089 Local infection of the skin and subcutaneous tissue, unspecified: Secondary | ICD-10-CM | POA: Insufficient documentation

## 2016-04-24 DIAGNOSIS — L989 Disorder of the skin and subcutaneous tissue, unspecified: Secondary | ICD-10-CM | POA: Insufficient documentation

## 2016-04-24 HISTORY — DX: Other nonthrombocytopenic purpura: D69.2

## 2016-04-24 LAB — COMPREHENSIVE METABOLIC PANEL
ALBUMIN: 3.3 g/dL — AB (ref 3.5–5.0)
ALT: 99 U/L — ABNORMAL HIGH (ref 17–63)
AST: 64 U/L — ABNORMAL HIGH (ref 15–41)
Alkaline Phosphatase: 98 U/L (ref 38–126)
Anion gap: 8 (ref 5–15)
BUN: 19 mg/dL (ref 6–20)
CALCIUM: 8.7 mg/dL — AB (ref 8.9–10.3)
CO2: 28 mmol/L (ref 22–32)
Chloride: 103 mmol/L (ref 101–111)
Creatinine, Ser: 0.68 mg/dL (ref 0.61–1.24)
GFR calc Af Amer: >60 mL/min (ref >60)
GFR calc non Af Amer: >60 mL/min (ref >60)
Glucose, Bld: 99 mg/dL (ref 65–99)
Potassium: 4.2 mmol/L (ref 3.5–5.1)
SODIUM: 139 mmol/L (ref 135–145)
Total Bilirubin: 0.7 mg/dL (ref 0.3–1.2)
Total Protein: 6.7 g/dL (ref 6.5–8.1)

## 2016-04-24 LAB — CBC
HCT: 39.8 % (ref 39.0–52.0)
Hemoglobin: 13.4 g/dL (ref 13.0–17.0)
MCH: 30.6 pg (ref 26.0–34.0)
MCHC: 33.7 g/dL (ref 30.0–36.0)
MCV: 90.9 fL (ref 78.0–100.0)
Platelets: 318 10*3/uL (ref 150–400)
RBC: 4.38 MIL/uL (ref 4.22–5.81)
RDW: 14.4 % (ref 11.5–15.5)
WBC: 8.7 10*3/uL (ref 4.0–10.5)

## 2016-04-24 LAB — PROTIME-INR
INR: 1.04
Prothrombin Time: 13.6 seconds (ref 11.4–15.2)

## 2016-04-24 LAB — SEDIMENTATION RATE: Sed Rate: 35 mm/hr — ABNORMAL HIGH (ref 0–16)

## 2016-04-24 LAB — APTT: aPTT: 29 seconds (ref 24–36)

## 2016-04-24 NOTE — Patient Instructions (Signed)
We will call with the labs.  If he worsens, go to the ED.  I will arrange referral.  He has palpable purpura. I am concerned about vasculitis.   Take care  Dr. Lacinda Axon

## 2016-04-24 NOTE — Progress Notes (Signed)
Subjective:  Patient ID: Bryan Avila, male    DOB: 1941-01-11  Age: 75 y.o. MRN: [id number]  CC: Rash, LE swelling  HPI:  75 year old male presents with the above complaint.  Patient states that this started on Monday. He was seen for this at his primary care physician's office on 12/12. Per the note, he had a non-blanchable macular papular rash (ie purpura). This was presumed to be from ITP. However, his CBC returned with a normal platelet count. He presents today with continued complaints of rash and swelling of the lower extremity. Seems to have worsened since his last visit. No associated fever or chills. No reports of shortness of breath, chest pain or other symptoms. He states that he feels well otherwise. His wife is very concerned. No known inciting factor. No new medication changes. He has stopped aspirin as instructed to do so at his last visit. No new exposures. No other complaints or concerns at this time.  Social Hx   Social History   Social History  . Marital status: Married    Spouse name: N/A  . Number of children: 1  . 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 Smoking status: Never Smoker  . Smokeless tobacco: Never Used  . Alcohol use No  . Drug use: No  . Sexual activity: Not Asked   Other Topics Concern  . 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   Would not want Tube Feeds.   Does not have a living will or HPOA.    Review of Systems  Constitutional: Negative.   Respiratory: Negative.   Cardiovascular: Positive for leg swelling.  Skin: Negative for rash.   Objective:  BP 110/70   Pulse 77   Temp 97.7 F (36.5 C) (Oral)   Resp 17   Ht 5' 6.25" (1.683 m)   Wt 151 lb 12 oz (68.8 kg)   SpO2 98%   BMI 24.31 kg/m   BP/Weight 04/24/2016 04/20/2016 12/17/9831    Systolic BP 825 053 976  Diastolic BP 70 60 56  Wt. (Lbs) 151.75 152.5 150.25  BMI 24.31 24.43 24.06    Physical Exam  Constitutional: He is oriented to person, place, and time. He appears well-developed. No distress.  Cardiovascular: Normal rate and regular rhythm.   Pulmonary/Chest: Effort normal and breath sounds normal.  Neurological: He is alert and oriented to person, place, and time.  Skin:  Bilateral lower extremities with palpable purpura. See picture.  Psychiatric: He has a normal mood and affect.  Vitals reviewed.    Lab Results  Component Value Date   WBC 7.1 04/20/2016   HGB 13.6 04/20/2016   HCT 40.5 04/20/2016   PLT 160.0 04/20/2016   GLUCOSE 94 11/20/2015   CHOL 191 11/20/2015   TRIG 55.0 11/20/2015   HDL 66.60 11/20/2015   LDLCALC 114 (H) 11/20/2015   ALT 12 11/20/2015   AST 15 11/20/2015   NA 141 11/20/2015   K 4.3 11/20/2015   CL 105 11/20/2015   CREATININE 0.83 11/20/2015   BUN 16 11/20/2015   CO2 33 (H) 11/20/2015   TSH 1.38 11/02/2010   PSA 0.87 11/20/2015   MICROALBUR 1.3 11/02/2010    Assessment & Plan:   Problem List Items Addressed This Visit  Palpable purpura ([place]) - Primary    New problem.  Patient with palpable purpura. Concern for vasculitis. Obtaining additional studies today. Will likely need a biopsy and rheumatology referral. He appears well today and has no other issues. Normal vital signs. Will arrange referrals. If he worsens, the wife is aware that he needs to go directly to the hospital.      Relevant Orders   CBC   Comprehensive metabolic panel   PTT   INR/PT   Sed Rate (ESR)      Follow-up: Needs close follow up with PCP.  [place]

## 2016-04-24 NOTE — Progress Notes (Signed)
Pre-visit discussion using our clinic review tool. No additional management support is needed unless otherwise documented below in the visit note.  

## 2016-04-24 NOTE — Telephone Encounter (Signed)
[facility] Patient Name: Bryan Avila DOB: December 21, 1940 Initial Comment Caller states husband, platelets out of wack, feet and ankles swollen, they said it looked like ITP. She is wanting to know if this is part of this issue, and needs to know whether to take him in or not. Back of legs sore also, as of yesterday. Nurse Assessment Nurse: Dimas Chyle, RN, Dellis Filbert Date/Time Eilene Ghazi Time): 04/24/2016 9:06:51 AM Confirm and document reason for call. If symptomatic, describe symptoms. ---Caller states husband, platelets out of wack, feet and ankles swollen, they said it looked like ITP. She is wanting to know if this is part of this issue, and needs to know whether to take him in or not. Back of legs sore also, as of yesterday. Does the patient have any new or worsening symptoms? ---Yes Will a triage be completed? ---Yes Related visit to physician within the last 2 weeks? ---Yes Does the PT have any chronic conditions? (i.e. diabetes, asthma, etc.) ---Yes List chronic conditions. ---hypoglycemia Is this a behavioral health or substance abuse call? ---No Guidelines Guideline Title Affirmed Question Affirmed Notes Leg Swelling and Edema [1] Thigh, calf, or ankle swelling AND [2] only 1 side Final Disposition User See Physician within 4 Hours (or PCP triage) Dimas Chyle, RN, Dellis Filbert Referrals REFERRED TO PCP OFFICE Disagree/Comply: [facility]

## 2016-04-24 NOTE — Assessment & Plan Note (Signed)
New problem.  Patient with palpable purpura. Concern for vasculitis. Obtaining additional studies today. Will likely need a biopsy and rheumatology referral. He appears well today and has no other issues. Normal vital signs. Will arrange referrals. If he worsens, the wife is aware that he needs to go directly to the hospital.

## 2016-04-26 NOTE — Telephone Encounter (Signed)
Pt was seen at [facility] on 04/14/16.

## 2016-04-26 NOTE — Addendum Note (Signed)
Addended by: Coral Spikes on: 04/26/2016 07:45 AM   Modules accepted: Orders

## 2016-05-07 ENCOUNTER — Ambulatory Visit (INDEPENDENT_AMBULATORY_CARE_PROVIDER_SITE_OTHER): Payer: Medicare Other | Admitting: Internal Medicine

## 2016-05-07 ENCOUNTER — Encounter: Payer: Self-pay | Admitting: Internal Medicine

## 2016-05-07 VITALS — BP 130/80 | HR 65 | Temp 97.5°F | Wt 145.0 lb

## 2016-05-07 DIAGNOSIS — L723 Sebaceous cyst: Secondary | ICD-10-CM | POA: Insufficient documentation

## 2016-05-07 NOTE — Patient Instructions (Signed)
We should consider taking this out (office surgery), if it changes a lot.

## 2016-05-07 NOTE — Progress Notes (Signed)
Pre visit review using our clinic review tool, if applicable. No additional management support is needed unless otherwise documented below in the visit note. 

## 2016-05-07 NOTE — Assessment & Plan Note (Signed)
Fairly classic presentation Not sure why it suddenly became apparent Not infected (but discussed hot compresses, etc if inflamed) If really changes, should excise (just to be careful)

## 2016-05-07 NOTE — Progress Notes (Signed)
   Subjective:    Patient ID: Bryan Avila, male    DOB: 10/20/1940, 75 y.o.   MRN: [id number]  HPI Here with wife due to bulge by his lower riggh ribs Can feel a knot Noticed it yesterday AM and it was a little sore  No injury Did help son move some things 2 days ago--but no clear problem  Current Outpatient Prescriptions on File Prior to Visit  Medication Sig Dispense Refill  . acyclovir (ZOVIRAX) 400 MG tablet Take 400 mg by mouth 2 (two) times daily.  99   No current facility-administered medications on file prior to visit.     Allergies  Allergen Reactions  . Demerol [Meperidine]     Past Medical History:  Diagnosis Date  . Cataract 03/2009   left eye  . Hyperlipidemia   . Osteoporosis    osteoarthritis    Past Surgical History:  Procedure Laterality Date  . EYE SURGERY    . thumb surgery  1999   right    Family History  Problem Relation Age of Onset  . Arthritis Father   . Sleep apnea Father   . Alcohol abuse Brother   . Cancer Brother     cancer  . Cancer Paternal Grandfather     prostate    Social History   Social History  . Marital status: Married    Spouse name: N/A  . Number of children: 1  . 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 Smoking status: Never Smoker  . Smokeless tobacco: Never Used  . Alcohol use No  . Drug use: No  . Sexual activity: Not on file   Other Topics Concern  . 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   Would not want Tube Feeds.   Does not have a living will or HPOA.   Review of Systems Rash is some better on legs Still with some ankle soreness No cough or SOB No fever    Objective:   Physical Exam  Constitutional: He appears well-nourished. No distress.  Skin:  No purpura now visible on calves (just  varicosities)  Has firm, weill circumscribed mass (~8-20mm) overlying lower right ribs in anterior axillary line Freely movable over the ribs but has fixation to skin No tenderness or inflammation          Assessment & Plan:

## 2016-08-04 ENCOUNTER — Ambulatory Visit (INDEPENDENT_AMBULATORY_CARE_PROVIDER_SITE_OTHER): Payer: Medicare Other | Admitting: Family Medicine

## 2016-08-04 ENCOUNTER — Encounter: Payer: Self-pay | Admitting: Family Medicine

## 2016-08-04 DIAGNOSIS — R600 Localized edema: Secondary | ICD-10-CM | POA: Diagnosis not present

## 2016-08-04 DIAGNOSIS — R609 Edema, unspecified: Secondary | ICD-10-CM | POA: Insufficient documentation

## 2016-08-04 LAB — CBC WITH DIFFERENTIAL/PLATELET
Basophils Absolute: 0 10*3/uL (ref 0.0–0.1)
Basophils Relative: 0.6 % (ref 0.0–3.0)
Eosinophils Absolute: 0.3 10*3/uL (ref 0.0–0.7)
Eosinophils Relative: 4.5 % (ref 0.0–5.0)
HCT: 43.5 % (ref 39.0–52.0)
Hemoglobin: 14.5 g/dL (ref 13.0–17.0)
Lymphocytes Relative: 20 % (ref 12.0–46.0)
Lymphs Abs: 1.4 10*3/uL (ref 0.7–4.0)
MCHC: 33.3 g/dL (ref 30.0–36.0)
MCV: 92.5 fl (ref 78.0–100.0)
Monocytes Absolute: 0.7 10*3/uL (ref 0.1–1.0)
Monocytes Relative: 9.9 % (ref 3.0–12.0)
Neutro Abs: 4.6 10*3/uL (ref 1.4–7.7)
Neutrophils Relative %: 65 % (ref 43.0–77.0)
Platelets: 240 10*3/uL (ref 150.0–400.0)
RBC: 4.7 Mil/uL (ref 4.22–5.81)
RDW: 14.1 % (ref 11.5–15.5)
WBC: 7 10*3/uL (ref 4.0–10.5)

## 2016-08-04 LAB — COMPREHENSIVE METABOLIC PANEL
ALT: 12 U/L (ref 0–53)
AST: 13 U/L (ref 0–37)
Albumin: 3.8 g/dL (ref 3.5–5.2)
Alkaline Phosphatase: 69 U/L (ref 39–117)
BILIRUBIN TOTAL: 0.5 mg/dL (ref 0.2–1.2)
BUN: 17 mg/dL (ref 6–23)
CO2: 30 mEq/L (ref 19–32)
Calcium: 9.3 mg/dL (ref 8.4–10.5)
Chloride: 106 mEq/L (ref 96–112)
Creatinine, Ser: 0.83 mg/dL (ref 0.40–1.50)
GFR: 95.85 mL/min (ref >60.00)
GLUCOSE: 92 mg/dL (ref 70–99)
Potassium: 4 mEq/L (ref 3.5–5.1)
SODIUM: 141 meq/L (ref 135–145)
TOTAL PROTEIN: 6.5 g/dL (ref 6.0–8.3)

## 2016-08-04 LAB — BRAIN NATRIURETIC PEPTIDE: Pro B Natriuretic peptide (BNP): 115 pg/mL — ABNORMAL HIGH (ref 0.0–100.0)

## 2016-08-04 LAB — TSH: TSH: 2.19 u[IU]/mL (ref 0.35–4.50)

## 2016-08-04 NOTE — Progress Notes (Signed)
Subjective:   Patient ID: Bryan Avila, male    DOB: 1940/09/19, 76 y.o.   MRN: [id number]  Bryan Avila is a pleasant 76 y.o. year old male who presents to clinic today with Joint Swelling (Pt stated B/L ankle are swelling  for 2 weeks)  on 08/04/2016  HPI:   Mild bilateral LE edema. Not painful. No SOB No CP  Worse at the end of the day.  Not using compression hose.  Current Outpatient Prescriptions on File Prior to Visit  Medication Sig Dispense Refill  . acyclovir (ZOVIRAX) 400 MG tablet Take 400 mg by mouth 2 (two) times daily.  99   No current facility-administered medications on file prior to visit.     Allergies  Allergen Reactions  . Demerol [Meperidine]     Past Medical History:  Diagnosis Date  . Cataract 03/2009   left eye  . Hyperlipidemia   . Osteoporosis    osteoarthritis    Past Surgical History:  Procedure Laterality Date  . EYE SURGERY    . thumb surgery  1999   right    Family History  Problem Relation Age of Onset  . Arthritis Father   . Sleep apnea Father   . Alcohol abuse Brother   . Cancer Brother     cancer  . Cancer Paternal Grandfather     prostate    Social History   Social History  . Marital status: Married    Spouse name: N/A  . Number of children: 1  . 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 Smoking status: Never Smoker  . Smokeless tobacco: Never Used  . Alcohol use No  . Drug use: No  . Sexual activity: Not on file   Other Topics Concern  . 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   Would not want Tube Feeds.   Does not have a living will or HPOA.   The PMH, PSH, Social History, Family History, Medications, and allergies have been reviewed in [facility], and have been updated if relevant.   Review of Systems    Constitutional: Negative.   HENT: Negative.   Cardiovascular: Positive for leg swelling. Negative for chest pain and palpitations.  Gastrointestinal: Negative.   Genitourinary: Negative.   Musculoskeletal: Negative.   Neurological: Negative.   Hematological: Negative.   Psychiatric/Behavioral: Negative.   All other systems reviewed and are negative.      Objective:    BP 118/68   Pulse 68   Temp 97.6 F (36.4 C)   Ht 5' 6.6" (1.692 m)   Wt 155 lb (70.3 kg)   SpO2 98%   BMI 24.57 kg/m    Physical Exam  General:  pleasant male in no acute distress Eyes:  PERRL Ears:  External ear exam shows no significant lesions or deformities.  TMs normal bilaterally Hearing is grossly normal bilaterally. Nose:  External nasal examination shows no deformity or inflammation. Nasal mucosa are pink and moist without lesions or exudates. Mouth:  Oral mucosa and oropharynx without lesions or exudates.  Teeth in good repair. Neck:  no carotid bruit or thyromegaly no cervical or supraclavicular lymphadenopathy  Lungs:  Normal respiratory effort, chest expands symmetrically. Lungs are clear to auscultation, no crackles or wheezes.  Heart:  Normal rate and regular rhythm. S1 and S2 normal without gallop, murmur, click, rub or other extra sounds. Pulses:  R and L posterior tibial pulses are full and equal bilaterally  Extremities:  Trace bilateral edema  Psych:  Good eye contact, not anxious or depressed appearing       Assessment & Plan:   Localized edema - Plan: TSH, CBC with Differential/Platelet, Brain natriuretic peptide, Comprehensive metabolic panel No Follow-up on file.

## 2016-08-04 NOTE — Assessment & Plan Note (Signed)
Mild.  New. Likely dependent and benign but will check labs today to rule out other causes. Advised elevation, may be too mild for compression hose at this time. Orders Placed This Encounter  Procedures  . TSH  . CBC with Differential/Platelet  . Brain natriuretic peptide  . Comprehensive metabolic panel   The patient indicates understanding of these issues and agrees with the plan.

## 2016-08-11 ENCOUNTER — Telehealth: Payer: Self-pay

## 2016-08-11 NOTE — Telephone Encounter (Signed)
Pt has questions about referrals made at [facility] for dermatologist and rheumatologist. Rosaria Ferries [facility] suggested for pt to call [facility]. Pt voiced understanding.

## 2016-08-20 DIAGNOSIS — R233 Spontaneous ecchymoses: Secondary | ICD-10-CM | POA: Insufficient documentation

## 2016-08-22 NOTE — Progress Notes (Signed)
+Cardiology Office Note  Date:  08/24/2016   ID:  Bryan Avila, DOB Apr 12, 1941, MRN [id number]  PCP:  Bryan Norris, MD   Chief Complaint  Patient presents with  . other     LS in 2012 for BBB. Pt c/o swelling in bilateral ankles. Reviewed meds with pt verbally.    HPI:  Bryan Avila is a very pleasant 76 year old gentleman, patient of Dr. Deborra Avila,  Seen in 2012 for  abnormal EKG and episodes of dizziness Who presents by referral from Dr. Deborra Avila for leg swelling in December 2017, elevated BNP  Long discussion with him concerning the events of December 2017 He reported having relatively acute onset of leg swelling bilaterally up to the thighs,purpura. Otherwise felt well, denied any malaise, fatigue, viral-type symptoms Seen by primary care had normal CBC (slight drop in hemoglobin) Repeat blood work several days later after he was seen by Dr. Lacinda Avila Noted to have elevated sedimentation rate 35, elevated LFTs, drop in albumin, drop in calcium level, drop in platelets down to 160 Swelling went away within 1 week Follow-up lab work 3 months later showed that lab work had normalized Recent BNP 115  This was his only episode, no prior episodes before then and no episodes since then Denies any shortness of breath on exertion, currently with no leg edema Currently not on any medications, no blood pressure issues No known coronary disease  He has seen dermatology and rheumatology No biopsy obtained as legs look normal No rheumatologic workup performed as symptoms had resolved   No prior   echo, CT chest  EKG personally reviewed by myself on todays visit shows normal sinus rhythm with rate 64 beats per minute, right bundle branch block   PMH:   has a past medical history of Cataract (03/2009); Hyperlipidemia; and Osteoporosis.  PSH:    Past Surgical History:  Procedure Laterality Date  . EYE SURGERY    . thumb surgery  1999   right    Current Outpatient Prescriptions  Medication Sig  Dispense Refill  . acyclovir (ZOVIRAX) 400 MG tablet Take 400 mg by mouth 2 (two) times daily.  99  . Multiple Vitamins-Minerals (PRESERVISION AREDS PO) Take by mouth 2 (two) times daily.    . timolol (TIMOPTIC) 0.5 % ophthalmic solution      No current facility-administered medications for this visit.      Allergies:   Demerol [meperidine]   Social History:  The patient  reports that he has never smoked. He has never used smokeless tobacco. He reports that he does not drink alcohol or use drugs.   Family History:   family history includes Alcohol abuse in his brother; Arthritis in his father; Cancer in his brother and paternal grandfather; Sleep apnea in his father.    Review of Systems: Review of Systems  Constitutional: Negative.   Respiratory: Negative.   Cardiovascular: Negative.   Gastrointestinal: Negative.   Musculoskeletal: Negative.   Skin:       Purpura, now resolved   Neurological: Negative.   Psychiatric/Behavioral: Negative.   All other systems reviewed and are negative.    PHYSICAL EXAM: VS:  BP 116/80 (BP Location: Right Arm, Patient Position: Sitting, Cuff Size: Normal)   Pulse 64   Ht 5' 6.5" (1.689 m)   Wt 154 lb 8 oz (70.1 kg)   BMI 24.56 kg/m  , BMI Body mass index is 24.56 kg/m. GEN: Well nourished, well developed, in no acute distress  HEENT: normal ,  hard  of hearing  Neck: no JVD, carotid bruits, or masses Cardiac: RRR; no murmurs, rubs, or gallops,no edema  Respiratory:  clear to auscultation bilaterally, normal work of breathing GI: soft, nontender, nondistended, + BS MS: no deformity or atrophy  Skin: warm and dry, no rash Neuro:  Strength and sensation are intact Psych: euthymic mood, full affect    Recent Labs: 08/04/2016: ALT 12; BUN 17; Creatinine, Ser 0.83; Hemoglobin 14.5; Platelets 240.0; Potassium 4.0; Pro B Natriuretic peptide (BNP) 115.0; Sodium 141; TSH 2.19    Lipid Panel Lab Results  Component Value Date   CHOL 191  11/20/2015   HDL 66.60 11/20/2015   LDLCALC 114 (H) 11/20/2015   TRIG 55.0 11/20/2015      Wt Readings from Last 3 Encounters:  08/24/16 154 lb 8 oz (70.1 kg)  08/04/16 155 lb (70.3 kg)  05/07/16 145 lb (65.8 kg)       ASSESSMENT AND PLAN:  HYPERCHOLESTEROLEMIA, PURE - Plan: EKG 12-Lead Cholesterol reasonable on no cholesterol medications, 190  Localized edema - Plan: EKG 12-Lead Edema in December likely associated with his purpura, less likely cardiac related  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[place]) Very curious that he developed purpura December 2017 Etiology unclear, elevated LFTs, low platelets, drop of calcium, albumin Lower extremity edema with purpura up to the thighs Resolved on its own without intervention Suspect immune response,  recently seen by rheumatology, no testing performed Sole episode, no previous episodes and none since then  Bilateral hearing loss, unspecified hearing loss type   elevated BNP Likely unrelated to recent lower extremity swelling in December 2017, different etiology BNP close enough to normal, EKG unchanged from 2012, benign clinical exam,  No symptoms of shortness of breath or chest discomfort on exertion Recommended we watch them for now as there were no clear signs of fluid overload We'll hold off on further testing such as echocardiogram unless he develops further symptoms   Disposition:   F/U as needed    Total encounter time more than 45 minutes  Greater than 50% was spent in counseling and coordination of care with the patient    Orders Placed This Encounter  Procedures  . EKG 12-Lead     Signed, Bryan Avila, M.D., Ph.D. 08/24/2016  [place], [place]

## 2016-08-23 NOTE — Telephone Encounter (Signed)
Spoke to pt's wife. He has an appt with the cardiologist tomorrow to check on the swelling in his legs. Will make an appt with [place] if needed after that.

## 2016-08-23 NOTE — Telephone Encounter (Signed)
When his referral was put in back in Dec by Dr. Lacinda Axon I had contacted his dermatologist (Dr. Victorino December office) and they had said that he had an appointment coming up in Feb and that this issue could be evaluated at that time.  By what his wife stated when she came in the office last week was that she didn't look at his legs or do anything about the condition when he went to the appointment. She said that Dr. Victorino December office called her last week and said that they had received a letter (last week)stating that he needed to be seen. She didn't know where the letter came from. I did not send any letter to Dr. Maurie Boettcher office last week. His referral was completed back in Dec. She stated that he was scheduled to see Dr. Delman Cheadle and that she was going to ask to see the letter to see where it came from. His rheumatology referral was sent to [facility] in December as well. He was not scheduled for what every reason. I will take responsibility for that since I had not followed up.

## 2016-08-24 ENCOUNTER — Ambulatory Visit (INDEPENDENT_AMBULATORY_CARE_PROVIDER_SITE_OTHER): Payer: Medicare Other | Admitting: Cardiovascular Disease

## 2016-08-24 ENCOUNTER — Encounter: Payer: Self-pay | Admitting: Cardiovascular Disease

## 2016-08-24 VITALS — BP 116/80 | HR 64 | Ht 66.5 in | Wt 154.5 lb

## 2016-08-24 DIAGNOSIS — R7989 Other specified abnormal findings of blood chemistry: Secondary | ICD-10-CM | POA: Diagnosis not present

## 2016-08-24 DIAGNOSIS — E7800 Pure hypercholesterolemia, unspecified: Secondary | ICD-10-CM

## 2016-08-24 DIAGNOSIS — R600 Localized edema: Secondary | ICD-10-CM | POA: Diagnosis not present

## 2016-08-24 DIAGNOSIS — H9193 Unspecified hearing loss, bilateral: Secondary | ICD-10-CM | POA: Diagnosis not present

## 2016-08-24 DIAGNOSIS — D693 Immune thrombocytopenic purpura: Secondary | ICD-10-CM | POA: Diagnosis not present

## 2016-08-24 NOTE — Patient Instructions (Signed)
Medication Instructions:   No medication changes made  Labwork:  No new labs needed  Testing/Procedures:  No further testing at this time   I recommend watching educational videos on topics of interest to you at:       www.goemmi.com  Enter code: HEARTCARE    Follow-Up: It was a pleasure seeing you in the office today. Please call us if you have new issues that need to be addressed before your next appt.  [phone]  Your physician wants you to follow-up in:  As needed  If you need a refill on your cardiac medications before your next appointment, please call your pharmacy.     

## 2016-11-12 NOTE — Progress Notes (Signed)
PCP notes:   Health maintenance: No gaps identified.   Abnormal screenings:  Hearing-failed.   Patient concerns: None   Nurse concerns: None   Next PCP appt: 11/25/2016-Patient was already seen by PCP today.

## 2016-11-12 NOTE — Progress Notes (Signed)
Subjective:   Bryan Avila is a 76 y.o. male who presents for Medicare Annual/Subsequent preventive examination.  Review of Systems:  No ROS.  Medicare Wellness Visit. Additional risk factors are reflected in the social history.  Cardiac Risk Factors include: advanced age (>22men, >87 women)     Objective:    Vitals: BP 122/80   Pulse 61   Ht 5' 6.5" (1.689 m)   Wt 154 lb (69.9 kg)   SpO2 96%   BMI 24.48 kg/m   Body mass index is 24.48 kg/m.  Tobacco History  Smoking Status  . Never Smoker  Smokeless Tobacco  . Never Used     Counseling given: Not Answered   Past Medical History:  Diagnosis Date  . Cataract 03/2009   left eye  . Hyperlipidemia   . Osteoporosis    osteoarthritis   Past Surgical History:  Procedure Laterality Date  . EYE SURGERY    . thumb surgery  1999   right   Family History  Problem Relation Age of Onset  . Arthritis Father   . Sleep apnea Father   . Alcohol abuse Brother   . Cancer Brother        cancer  . Cancer Paternal Grandfather        prostate   History  Sexual Activity  . Sexual activity: No    Outpatient Encounter Prescriptions as of 11/25/2016  Medication Sig  . acyclovir (ZOVIRAX) 400 MG tablet Take 400 mg by mouth 2 (two) times daily.  . ASPIRIN 81 PO Take 81 mg by mouth 3 (three) times a week.  . Multiple Vitamins-Minerals (PRESERVISION AREDS PO) Take by mouth 2 (two) times daily.  . timolol (TIMOPTIC) 0.5 % ophthalmic solution    No facility-administered encounter medications on file as of 11/25/2016.     Activities of Daily Living In your present state of health, do you have any difficulty performing the following activities: 11/25/2016  Hearing? N  Vision? N  Difficulty concentrating or making decisions? N  Walking or climbing stairs? N  Dressing or bathing? N  Doing errands, shopping? N  Preparing Food and eating ? N  Using the Toilet? N  In the past six months, have you accidently leaked urine? N    Do you have problems with loss of bowel control? N  Managing your Medications? N  Managing your Finances? N  Housekeeping or managing your Housekeeping? N  Some recent data might be hidden    Patient Care Team: Lucille Passy, MD as PCP - General (Family Medicine) Jari Pigg, MD as Consulting Physician (Dermatology) Luberta Mutter, MD as Consulting Physician (Ophthalmology) Minna Merritts, MD as Consulting Physician (Cardiology)   Assessment:    Physical assessment deferred to PCP.  Exercise Activities and Dietary recommendations Current Exercise Habits: Home exercise routine, Type of exercise: walking, Time (Minutes): 30, Frequency (Times/Week): 5, Weekly Exercise (Minutes/Week): 150, Intensity: Moderate  Goals    . Increase physical activity (pt-stated)          Starting 11/25/2016, I will continue to walk for at least 30 minutes 5 days/week.      Fall Risk Fall Risk  11/25/2016 11/25/2015 11/21/2014 11/19/2013 11/19/2013  Falls in the past year? No Yes No No No  Number falls in past yr: - 1 - - -   Depression Screen PHQ 2/9 Scores 11/25/2016 11/25/2015 11/21/2014 11/19/2013  PHQ - 2 Score 0 0 0 0    Cognitive Function PLEASE NOTE: A  Mini-Cog screen was completed. Maximum score is 20. A value of 0 denotes this part of [place] MMSE was not completed or the patient failed this part of the Mini-Cog screening.   Mini-Cog Screening Orientation to Time - Max 5 pts Orientation to Place - Max 5 pts Registration - Max 3 pts Recall - Max 3 pts Language Repeat - Max 1 pts Language Follow 3 Step Command - Max 3 pts      Mini-Cog - 11/25/16 1220    Normal clock drawing test? yes   How many words correct? 3      MMSE - Mini Mental State Exam 11/25/2016  Orientation to time 5  Orientation to Place 5  Registration 3  Attention/ Calculation 0  Recall 3  Language- name 2 objects 0  Language- repeat 1  Language- follow 3 step command 3  Language- read & follow direction  0  Write a sentence 0  Copy design 0  Total score 20        Immunization History  Administered Date(s) Administered  . Influenza Whole 04/09/2000, 03/10/2007, 02/07/2008, 02/12/2009, 02/10/2010  . Influenza, High Dose Seasonal PF 01/28/2015, 01/24/2016  . Pneumococcal Conjugate-13 11/19/2013  . Pneumococcal Polysaccharide-23 03/31/2007  . Td 05/10/1996, 10/28/2006  . Tdap 12/16/2014  . Zoster 03/25/2010   Screening Tests Health Maintenance  Topic Date Due  . INFLUENZA VACCINE  12/08/2016  . Fecal DNA (Cologuard)  12/03/2018  . TETANUS/TDAP  12/15/2024  . PNA vac Low Risk Adult  Completed      Plan:    I have personally reviewed and noted the following in the patient's chart:   . Medical and social history . Use of alcohol, tobacco or illicit drugs  . Current medications and supplements . Functional ability and status . Nutritional status . Physical activity . Advanced directives . List of other physicians . Vitals . Screenings to include cognitive, depression, and falls . Referrals and appointments  In addition, I have reviewed and discussed with patient certain preventive protocols, quality metrics, and best practice recommendations. A written personalized care plan for preventive services as well as general preventive health recommendations were provided to patient.     Dorrene German, RN  11/25/2016

## 2016-11-18 ENCOUNTER — Other Ambulatory Visit: Payer: Self-pay | Admitting: Family Medicine

## 2016-11-18 DIAGNOSIS — Z125 Encounter for screening for malignant neoplasm of prostate: Secondary | ICD-10-CM | POA: Insufficient documentation

## 2016-11-18 DIAGNOSIS — Z0000 Encounter for general adult medical examination without abnormal findings: Secondary | ICD-10-CM | POA: Insufficient documentation

## 2016-11-18 DIAGNOSIS — E7800 Pure hypercholesterolemia, unspecified: Secondary | ICD-10-CM

## 2016-11-22 ENCOUNTER — Ambulatory Visit: Payer: BC Managed Care – PPO

## 2016-11-22 ENCOUNTER — Other Ambulatory Visit: Payer: BC Managed Care – PPO | Admitting: Family Medicine

## 2016-11-25 ENCOUNTER — Ambulatory Visit (INDEPENDENT_AMBULATORY_CARE_PROVIDER_SITE_OTHER): Payer: Medicare Other

## 2016-11-25 ENCOUNTER — Encounter: Payer: BC Managed Care – PPO | Admitting: Family Medicine

## 2016-11-25 ENCOUNTER — Other Ambulatory Visit (INDEPENDENT_AMBULATORY_CARE_PROVIDER_SITE_OTHER): Payer: Medicare Other

## 2016-11-25 ENCOUNTER — Ambulatory Visit (INDEPENDENT_AMBULATORY_CARE_PROVIDER_SITE_OTHER): Payer: Medicare Other | Admitting: Family Medicine

## 2016-11-25 ENCOUNTER — Encounter: Payer: Self-pay | Admitting: Family Medicine

## 2016-11-25 VITALS — BP 122/80 | HR 61 | Ht 66.5 in | Wt 154.0 lb

## 2016-11-25 DIAGNOSIS — E7800 Pure hypercholesterolemia, unspecified: Secondary | ICD-10-CM | POA: Diagnosis not present

## 2016-11-25 DIAGNOSIS — Z0000 Encounter for general adult medical examination without abnormal findings: Secondary | ICD-10-CM | POA: Insufficient documentation

## 2016-11-25 DIAGNOSIS — I4510 Unspecified right bundle-branch block: Secondary | ICD-10-CM | POA: Diagnosis not present

## 2016-11-25 DIAGNOSIS — D692 Other nonthrombocytopenic purpura: Secondary | ICD-10-CM

## 2016-11-25 DIAGNOSIS — H9193 Unspecified hearing loss, bilateral: Secondary | ICD-10-CM | POA: Diagnosis not present

## 2016-11-25 DIAGNOSIS — Z125 Encounter for screening for malignant neoplasm of prostate: Secondary | ICD-10-CM

## 2016-11-25 LAB — COMPREHENSIVE METABOLIC PANEL
ALT: 11 U/L (ref 0–53)
AST: 14 U/L (ref 0–37)
Albumin: 4 g/dL (ref 3.5–5.2)
Alkaline Phosphatase: 80 U/L (ref 39–117)
BUN: 15 mg/dL (ref 6–23)
CALCIUM: 9.5 mg/dL (ref 8.4–10.5)
CHLORIDE: 106 meq/L (ref 96–112)
CO2: 30 meq/L (ref 19–32)
Creatinine, Ser: 0.81 mg/dL (ref 0.40–1.50)
GFR: 98.51 mL/min (ref >60.00)
Glucose, Bld: 102 mg/dL — ABNORMAL HIGH (ref 70–99)
Potassium: 4.1 mEq/L (ref 3.5–5.1)
Sodium: 141 mEq/L (ref 135–145)
Total Bilirubin: 0.4 mg/dL (ref 0.2–1.2)
Total Protein: 6.4 g/dL (ref 6.0–8.3)

## 2016-11-25 LAB — LIPID PANEL
CHOL/HDL RATIO: 3
Cholesterol: 184 mg/dL (ref 0–200)
HDL: 71 mg/dL (ref >39.00)
LDL Cholesterol: 84 mg/dL (ref 0–99)
NONHDL: 113.02
Triglycerides: 145 mg/dL (ref 0.0–149.0)
VLDL: 29 mg/dL (ref 0.0–40.0)

## 2016-11-25 LAB — PSA, MEDICARE: PSA: 0.72 ng/ml (ref 0.10–4.00)

## 2016-11-25 NOTE — Assessment & Plan Note (Signed)
Reviewed preventive care protocols,keep appointment with wellness nurse. Discussed nutrition, exercise, diet, and healthy lifestyle.

## 2016-11-25 NOTE — Progress Notes (Signed)
I reviewed health advisor's note, was available for consultation, and agree with documentation and plan.  

## 2016-11-25 NOTE — Progress Notes (Signed)
Subjective:   Patient ID: Bryan Avila, male    DOB: 1940/12/11, 76 y.o.   MRN: [id number]  Elizer Bostic Larock is a pleasant 76 y.o. year old male who presents to clinic today with Annual Exam  on 11/25/2016  HPI:  Doing well.  He does not take any medication other than what his eye doctor prescribes.  Seeing our wellness nurse after this visit to review his health maintenance.  Lab Results  Component Value Date   CHOL 191 11/20/2015   HDL 66.60 11/20/2015   LDLCALC 114 (H) 11/20/2015   TRIG 55.0 11/20/2015   CHOLHDL 3 11/20/2015   Lab Results  Component Value Date   ALT 12 08/04/2016   AST 13 08/04/2016   ALKPHOS 69 08/04/2016   BILITOT 0.5 08/04/2016   Lab Results  Component Value Date   WBC 7.0 08/04/2016   HGB 14.5 08/04/2016   HCT 43.5 08/04/2016   MCV 92.5 08/04/2016   PLT 240.0 08/04/2016   Lab Results  Component Value Date   NA 141 08/04/2016   K 4.0 08/04/2016   CL 106 08/04/2016   CO2 30 08/04/2016     Current Outpatient Prescriptions on File Prior to Visit  Medication Sig Dispense Refill  . acyclovir (ZOVIRAX) 400 MG tablet Take 400 mg by mouth 2 (two) times daily.  99  . Multiple Vitamins-Minerals (PRESERVISION AREDS PO) Take by mouth 2 (two) times daily.    . timolol (TIMOPTIC) 0.5 % ophthalmic solution      No current facility-administered medications on file prior to visit.     Allergies  Allergen Reactions  . Demerol [Meperidine]     Past Medical History:  Diagnosis Date  . Cataract 03/2009   left eye  . Hyperlipidemia   . Osteoporosis    osteoarthritis    Past Surgical History:  Procedure Laterality Date  . EYE SURGERY    . thumb surgery  1999   right    Family History  Problem Relation Age of Onset  . Arthritis Father   . Sleep apnea Father   . Alcohol abuse Brother   . Cancer Brother        cancer  . Cancer Paternal Grandfather        prostate    Social History   Social History  . Marital status: Married    Spouse  name: N/A  . Number of children: 1  . 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 Smoking status: Never Smoker  . Smokeless tobacco: Never Used  . Alcohol use No  . Drug use: No  . Sexual activity: Not on file   Other Topics Concern  . Not on file   Social History Narrative   Hobbies: Consulting civil engineer    Rents [phone]   Prior Management consultant ([place]      Full code.   Would not want Tube Feeds.   Does not have a living will or HPOA.   The PMH, PSH, Social History, Family History, Medications, and allergies have been reviewed in [facility], and have been updated if relevant.   Review of Systems  Constitutional: Negative.   HENT: Negative.   Eyes: Negative.   Respiratory: Negative.   Cardiovascular: Negative.   Gastrointestinal: Negative.   Endocrine: Negative.   Genitourinary: Negative.   Musculoskeletal: Negative.   Skin: Negative.   Allergic/Immunologic: Negative.   Neurological: Negative.  Hematological: Negative.   Psychiatric/Behavioral: Negative.   All other systems reviewed and are negative.      Objective:    BP 122/80   Pulse 61   Ht 5' 6.5" (1.689 m)   Wt 154 lb (69.9 kg)   SpO2 96%   BMI 24.48 kg/m    Physical Exam   General:  pleasant male in no acute distress Eyes:  PERRL Ears:  External ear exam shows no significant lesions or deformities.  TMs normal bilaterally Hearing is grossly normal bilaterally. Nose:  External nasal examination shows no deformity or inflammation. Nasal mucosa are pink and moist without lesions or exudates. Mouth:  Oral mucosa and oropharynx without lesions or exudates.  Teeth in good repair. Neck:  no carotid bruit or thyromegaly no cervical or supraclavicular lymphadenopathy  Lungs:  Normal respiratory effort, chest expands symmetrically. Lungs are clear to auscultation, no crackles or wheezes. Heart:  Normal rate and regular  rhythm. S1 and S2 normal without gallop, murmur, click, rub or other extra sounds. Abdomen:  Bowel sounds positive,abdomen soft and non-tender without masses, organomegaly or hernias noted. Pulses:  R and L posterior tibial pulses are full and equal bilaterally  Extremities:  no edema  Psych:  Good eye contact, not anxious or depressed appearing      Assessment & Plan:   Visit for well man health check  HYPERCHOLESTEROLEMIA, PURE  Palpable purpura ([place])  Bilateral hearing loss, unspecified hearing loss type  Right bundle branch block No Follow-up on file.

## 2016-11-25 NOTE — Patient Instructions (Addendum)
Bryan Avila , Thank you for taking time to come for your Medicare Wellness Visit. I appreciate your ongoing commitment to your health goals. Please review the following plan we discussed and let me know if I can assist you in the future.   These are the goals we discussed: Goals    . Increase physical activity (pt-stated)          Starting 11/25/2016, I will continue to walk for at least 30 minutes 5 days/week.       This is a list of the screening recommended for you and due dates:  Health Maintenance  Topic Date Due  . Flu Shot  12/08/2016  . Colon Cancer Screening  09/08/2020  . Tetanus Vaccine  12/15/2024  . Pneumonia vaccines  Completed   Preventive Care for Adults  A healthy lifestyle and preventive care can promote health and wellness. Preventive health guidelines for adults include the following key practices.  . A routine yearly physical is a good way to check with your health care provider about your health and preventive screening. It is a chance to share any concerns and updates on your health and to receive a thorough exam.  . Visit your dentist for a routine exam and preventive care every 6 months. Brush your teeth twice a day and floss once a day. Good oral hygiene prevents tooth decay and gum disease.  . The frequency of eye exams is based on your age, health, family medical history, use  of contact lenses, and other factors. Follow your health care provider's ecommendations for frequency of eye exams.  . Eat a healthy diet. Foods like vegetables, fruits, whole grains, low-fat dairy products, and lean protein foods contain the nutrients you need without too many calories. Decrease your intake of foods high in solid fats, added sugars, and salt. Eat the right amount of calories for you. Get information about a proper diet from your health care provider, if necessary.  . Regular physical exercise is one of the most important things you can do for your health. Most adults  should get at least 150 minutes of moderate-intensity exercise (any activity that increases your heart rate and causes you to sweat) each week. In addition, most adults need muscle-strengthening exercises on 2 or more days a week.  Silver Sneakers may be a benefit available to you. To determine eligibility, you may visit the website: www.silversneakers.com or contact program at [id number] Mon-Fri between 8AM-8PM.   . Maintain a healthy weight. The body mass index (BMI) is a screening tool to identify possible weight problems. It provides an estimate of body fat based on height and weight. Your health care provider can find your BMI and can help you achieve or maintain a healthy weight.   For adults 20 years and older: ? A BMI below 18.5 is considered underweight. ? A BMI of 18.5 to 24.9 is normal. ? A BMI of 25 to 29.9 is considered overweight. ? A BMI of 30 and above is considered obese.   . Maintain normal blood lipids and cholesterol levels by exercising and minimizing your intake of saturated fat. Eat a balanced diet with plenty of fruit and vegetables. Blood tests for lipids and cholesterol should begin at age 70 and be repeated every 5 years. If your lipid or cholesterol levels are high, you are over 50, or you are at high risk for heart disease, you may need your cholesterol levels checked more frequently. Ongoing high lipid and cholesterol levels should  be treated with medicines if diet and exercise are not working.  . If you smoke, find out from your health care provider how to quit. If you do not use tobacco, please do not start.  . If you choose to drink alcohol, please do not consume more than 2 drinks per day. One drink is considered to be 12 ounces (355 mL) of beer, 5 ounces (148 mL) of wine, or 1.5 ounces (44 mL) of liquor.  . If you are 44-21 years old, ask your health care provider if you should take aspirin to prevent strokes.  . Use sunscreen. Apply sunscreen liberally and  repeatedly throughout the day. You should seek shade when your shadow is shorter than you. Protect yourself by wearing long sleeves, pants, a wide-brimmed hat, and sunglasses year round, whenever you are outdoors.  . Once a month, do a whole body skin exam, using a mirror to look at the skin on your back. Tell your health care provider of new moles, moles that have irregular borders, moles that are larger than a pencil eraser, or moles that have changed in shape or color.

## 2017-02-09 ENCOUNTER — Ambulatory Visit (INDEPENDENT_AMBULATORY_CARE_PROVIDER_SITE_OTHER): Payer: Medicare Other | Admitting: Primary Care

## 2017-02-09 DIAGNOSIS — E7800 Pure hypercholesterolemia, unspecified: Secondary | ICD-10-CM | POA: Diagnosis not present

## 2017-02-09 DIAGNOSIS — I4510 Unspecified right bundle-branch block: Secondary | ICD-10-CM

## 2017-02-09 DIAGNOSIS — M19019 Primary osteoarthritis, unspecified shoulder: Secondary | ICD-10-CM | POA: Diagnosis not present

## 2017-02-09 NOTE — Assessment & Plan Note (Signed)
Saw cardiology in April 2018, BBB unchanged from 2012. Will follow up on an as needed basis.

## 2017-02-09 NOTE — Patient Instructions (Signed)
Schedule your Medicare Wellness visit to me in July 2019.  It was a pleasure to meet you today! Please don't hesitate to call me with any questions.

## 2017-02-09 NOTE — Progress Notes (Signed)
Subjective:    Patient ID: Bryan Avila, male    DOB: 04/25/1941, 76 y.o.   MRN: [id number]  HPI  Bryan Avila is a 76 year old male who presents today to transfer care from Dr. Deborra Medina. His last medicare wellness visit was in July 2018.   1) Osteoarthritis: History of arthritis to hands. No problem with aches at this time.   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 BBB unchanged from 2012. He had a sudden onset of lower extremity edema with purpura that eventually dissipated without intervention. He denies lower extremity edema or purpura since his visit in April 2018. He's taking aspirin three times weekly.  3) Hyperlipidemia: Lipid panel in July 2018 with TC of 184, LDL of 84, Trigs of 145. He is not managed on medication.  4) Elevated Eye Pressures: Currently managed on Timolol and acyclovir tablets. Currently following with opthalmology every 6 months.   Review of Systems  Eyes: Negative for visual disturbance.  Respiratory: Negative for shortness of breath.   Cardiovascular: Negative for chest pain.  Musculoskeletal: Negative for arthralgias.  Skin: Negative for color change.       Past Medical History:  Diagnosis Date  . Cataract 03/2009   left eye  . Hyperlipidemia   . Osteoporosis    osteoarthritis     Social History   Social History  . Marital status: Married    Spouse name: N/A  . Number of children: 1  . 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 Smoking status: Never Smoker  . Smokeless tobacco: Never Used  . Alcohol use No  . Drug use: No  . Sexual activity: No   Other Topics Concern  . 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   Would not want Tube Feeds.   Does not have a living will or HPOA.    Past Surgical History:  Procedure Laterality  Date  . EYE SURGERY    . thumb surgery  1999   right    Family History  Problem Relation Age of Onset  . Arthritis Father   . Sleep apnea Father   . Alcohol abuse Brother   . Cancer Brother        cancer  . Cancer Paternal Grandfather        prostate    Allergies  Allergen Reactions  . Demerol [Meperidine] Other (See Comments)    "I almost died from that."-Severe bradycardia    Current Outpatient Prescriptions on File Prior to Visit  Medication Sig Dispense Refill  . ASPIRIN 81 PO Take 81 mg by mouth 3 (three) times a week.    . Multiple Vitamins-Minerals (PRESERVISION AREDS PO) Take by mouth 2 (two) times daily.    . timolol (TIMOPTIC) 0.5 % ophthalmic solution     . acyclovir (ZOVIRAX) 400 MG tablet Take 400 mg by mouth 2 (two) times daily.  99   No current facility-administered medications on file prior to visit.     BP 118/74   Pulse 64   Temp 98.4 F (36.9 C) (Oral)   Ht 5' 6.5" (1.689 m)   Wt 150 lb 6.4 oz (68.2 kg)   SpO2 95%   BMI 23.91 kg/m    Objective:  Physical Exam  Constitutional: He is oriented to person, place, and time. He appears well-nourished.  Neck: Neck supple.  Cardiovascular: Normal rate and regular rhythm.   Pulmonary/Chest: Effort normal and breath sounds normal.  Neurological: He is alert and oriented to person, place, and time.  Skin: Skin is warm and dry. No rash noted. No erythema.          Assessment & Plan:

## 2017-02-09 NOTE — Assessment & Plan Note (Signed)
No concerns for arthralgias.

## 2017-02-09 NOTE — Assessment & Plan Note (Signed)
Recent lipid panel stable, repeat in July 2019.

## 2017-10-17 ENCOUNTER — Ambulatory Visit (INDEPENDENT_AMBULATORY_CARE_PROVIDER_SITE_OTHER): Payer: Medicare Other | Admitting: Primary Care

## 2017-10-17 ENCOUNTER — Ambulatory Visit (INDEPENDENT_AMBULATORY_CARE_PROVIDER_SITE_OTHER)
Admission: RE | Admit: 2017-10-17 | Discharge: 2017-10-17 | Disposition: A | Discharge status: Home / Self Care | Payer: Medicare Other | Source: Ambulatory Visit | Attending: Primary Care | Admitting: Primary Care

## 2017-10-17 VITALS — BP 116/66 | HR 60 | Temp 97.8°F | Ht 66.5 in | Wt 147.8 lb

## 2017-10-17 DIAGNOSIS — M25511 Pain in right shoulder: Secondary | ICD-10-CM

## 2017-10-17 NOTE — Patient Instructions (Addendum)
Complete xray(s) prior to leaving today. I will notify you of your results once received.  Start Tylenol for pain. Do not exceed 3000 mg in 24 hours. Try this for 3-4 days consistently.   Try the exercises below.  Please update [place] if no improvement over the next 1-2 weeks.  It was a pleasure to see you today!   Shoulder Exercises Ask your health care provider which exercises are safe for you. Do exercises exactly as told by your health care provider and adjust them as directed. It is normal to feel mild stretching, pulling, tightness, or discomfort as you do these exercises, but you should stop right away if you feel sudden pain or your pain gets worse.Do not begin these exercises until told by your health care provider. RANGE OF MOTION EXERCISES These exercises warm up your muscles and joints and improve the movement and flexibility of your shoulder. These exercises also help to relieve pain, numbness, and tingling. These exercises involve stretching your injured shoulder directly. Exercise A: Pendulum  1. Stand near a wall or a surface that you can hold onto for balance. 2. Bend at the waist and let your left / right arm hang straight down. Use your other arm to support you. Keep your back straight and do not lock your knees. 3. Relax your left / right arm and shoulder muscles, and move your hips and your trunk so your left / right arm swings freely. Your arm should swing because of the motion of your body, not because you are using your arm or shoulder muscles. 4. Keep moving your body so your arm swings in the following directions, as told by your health care provider: ? Side to side. ? Forward and backward. ? In clockwise and counterclockwise circles. 5. Continue each motion for __________ seconds, or for as long as told by your health care provider. 6. Slowly return to the starting position. Repeat __________ times. Complete this exercise __________ times a day. Exercise B:Flexion,  Standing  1. Stand and hold a broomstick, a cane, or a similar object. Place your hands a little more than shoulder-width apart on the object. Your left / right hand should be palm-up, and your other hand should be palm-down. 2. Keep your elbow straight and keep your shoulder muscles relaxed. Push the stick down with your healthy arm to raise your left / right arm in front of your body, and then over your head until you feel a stretch in your shoulder. ? Avoid shrugging your shoulder while you raise your arm. Keep your shoulder blade tucked down toward the middle of your back. 3. Hold for __________ seconds. 4. Slowly return to the starting position. Repeat __________ times. Complete this exercise __________ times a day. Exercise C: Abduction, Standing 1. Stand and hold a broomstick, a cane, or a similar object. Place your hands a little more than shoulder-width apart on the object. Your left / right hand should be palm-up, and your other hand should be palm-down. 2. While keeping your elbow straight and your shoulder muscles relaxed, push the stick across your body toward your left / right side. Raise your left / right arm to the side of your body and then over your head until you feel a stretch in your shoulder. ? Do not raise your arm above shoulder height, unless your health care provider tells you to do that. ? Avoid shrugging your shoulder while you raise your arm. Keep your shoulder blade tucked down toward the middle  of your back. 3. Hold for __________ seconds. 4. Slowly return to the starting position. Repeat __________ times. Complete this exercise __________ times a day. Exercise D:Internal Rotation  1. Place your left / right hand behind your back, palm-up. 2. Use your other hand to dangle an exercise band, a towel, or a similar object over your shoulder. Grasp the band with your left / right hand so you are holding onto both ends. 3. Gently pull up on the band until you feel a  stretch in the front of your left / right shoulder. ? Avoid shrugging your shoulder while you raise your arm. Keep your shoulder blade tucked down toward the middle of your back. 4. Hold for __________ seconds. 5. Release the stretch by letting go of the band and lowering your hands. Repeat __________ times. Complete this exercise __________ times a day. STRETCHING EXERCISES These exercises warm up your muscles and joints and improve the movement and flexibility of your shoulder. These exercises also help to relieve pain, numbness, and tingling. These exercises are done using your healthy shoulder to help stretch the muscles of your injured shoulder. Exercise E: Warehouse manager (External Rotation and Abduction)  1. Stand in a doorway with one of your feet slightly in front of the other. This is called a staggered stance. If you cannot reach your forearms to the door frame, stand facing a corner of a room. 2. Choose one of the following positions as told by your health care provider: ? Place your hands and forearms on the door frame above your head. ? Place your hands and forearms on the door frame at the height of your head. ? Place your hands on the door frame at the height of your elbows. 3. Slowly move your weight onto your front foot until you feel a stretch across your chest and in the front of your shoulders. Keep your head and chest upright and keep your abdominal muscles tight. 4. Hold for __________ seconds. 5. To release the stretch, shift your weight to your back foot. Repeat __________ times. Complete this stretch __________ times a day. Exercise F:Extension, Standing 1. Stand and hold a broomstick, a cane, or a similar object behind your back. ? Your hands should be a little wider than shoulder-width apart. ? Your palms should face away from your back. 2. Keeping your elbows straight and keeping your shoulder muscles relaxed, move the stick away from your body until you feel a  stretch in your shoulder. ? Avoid shrugging your shoulders while you move the stick. Keep your shoulder blade tucked down toward the middle of your back. 3. Hold for __________ seconds. 4. Slowly return to the starting position. Repeat __________ times. Complete this exercise __________ times a day. STRENGTHENING EXERCISES These exercises build strength and endurance in your shoulder. Endurance is the ability to use your muscles for a long time, even after they get tired. Exercise G:External Rotation  1. Sit in a stable chair without armrests. 2. Secure an exercise band at elbow height on your left / right side. 3. Place a soft object, such as a folded towel or a small pillow, between your left / right upper arm and your body to move your elbow a few inches away (about 10 cm) from your side. 4. Hold the end of the band so it is tight and there is no slack. 5. Keeping your elbow pressed against the soft object, move your left / right forearm out, away from your abdomen. Keep  your body steady so only your forearm moves. 6. Hold for __________ seconds. 7. Slowly return to the starting position. Repeat __________ times. Complete this exercise __________ times a day. Exercise H:Shoulder Abduction  1. Sit in a stable chair without armrests, or stand. 2. Hold a __________ weight in your left / right hand, or hold an exercise band with both hands. 3. Start with your arms straight down and your left / right palm facing in, toward your body. 4. Slowly lift your left / right hand out to your side. Do not lift your hand above shoulder height unless your health care provider tells you that this is safe. ? Keep your arms straight. ? Avoid shrugging your shoulder while you do this movement. Keep your shoulder blade tucked down toward the middle of your back. 5. Hold for __________ seconds. 6. Slowly lower your arm, and return to the starting position. Repeat __________ times. Complete this exercise  __________ times a day. Exercise I:Shoulder Extension 1. Sit in a stable chair without armrests, or stand. 2. Secure an exercise band to a stable object in front of you where it is at shoulder height. 3. Hold one end of the exercise band in each hand. Your palms should face each other. 4. Straighten your elbows and lift your hands up to shoulder height. 5. Step back, away from the secured end of the exercise band, until the band is tight and there is no slack. 6. Squeeze your shoulder blades together as you pull your hands down to the sides of your thighs. Stop when your hands are straight down by your sides. Do not let your hands go behind your body. 7. Hold for __________ seconds. 8. Slowly return to the starting position. Repeat __________ times. Complete this exercise __________ times a day. Exercise J:Standing Shoulder Row 1. Sit in a stable chair without armrests, or stand. 2. Secure an exercise band to a stable object in front of you so it is at waist height. 3. Hold one end of the exercise band in each hand. Your palms should be in a thumbs-up position. 4. Bend each of your elbows to an "L" shape (about 90 degrees) and keep your upper arms at your sides. 5. Step back until the band is tight and there is no slack. 6. Slowly pull your elbows back behind you. 7. Hold for __________ seconds. 8. Slowly return to the starting position. Repeat __________ times. Complete this exercise __________ times a day. Exercise K:Shoulder Press-Ups  1. Sit in a stable chair that has armrests. Sit upright, with your feet flat on the floor. 2. Put your hands on the armrests so your elbows are bent and your fingers are pointing forward. Your hands should be about even with the sides of your body. 3. Push down on the armrests and use your arms to lift yourself off of the chair. Straighten your elbows and lift yourself up as much as you comfortably can. ? Move your shoulder blades down, and avoid  letting your shoulders move up toward your ears. ? Keep your feet on the ground. As you get stronger, your feet should support less of your body weight as you lift yourself up. 4. Hold for __________ seconds. 5. Slowly lower yourself back into the chair. Repeat __________ times. Complete this exercise __________ times a day. Exercise L: Wall Push-Ups  1. Stand so you are facing a stable wall. Your feet should be about one arm-length away from the wall. 2. Lean forward and  place your palms on the wall at shoulder height. 3. Keep your feet flat on the floor as you bend your elbows and lean forward toward the wall. 4. Hold for __________ seconds. 5. Straighten your elbows to push yourself back to the starting position. Repeat __________ times. Complete this exercise __________ times a day. This information is not intended to replace advice given to you by your health care provider. Make sure you discuss any questions you have with your health care provider. Document Released: 03/10/2005 Document Revised: 01/19/2016 Document Reviewed: 01/05/2015 Elsevier Interactive Patient Education  2018 [employer].

## 2017-10-17 NOTE — Progress Notes (Signed)
Subjective:    Patient ID: Bryan Avila, male    DOB: 27-Feb-1941, 77 y.o.   MRN: [id number]  HPI  Mr. Beavers is a 77 year old male with a history of osteoarthritis who presents today with a chief complaint of shoulder pain.   His pain is located to the right shoulder to the anterior side with radiation up to his right lateral neck. His pain is worse at night as he will notice discomfort when laying on his right shoulder, improved in the morning.   He denies trauma, injury, numbness tingling. He's not taken anything OTC for his symptoms. He's been doing some exercises to his right shoulder including push up's without improvement. His symptoms began 2 months ago.   Review of Systems  Musculoskeletal: Positive for arthralgias.  Skin: Negative for color change.  Neurological: Negative for weakness and numbness.       Past Medical History:  Diagnosis Date  . Cataract 03/2009   left eye  . Hyperlipidemia   . Osteoporosis    osteoarthritis     Social History   Socioeconomic History  . Marital status: Married    Spouse name: Not on file  . Number of children: 1  . Years of education: Not on file  . Highest education level: Not on file  Occupational History  . 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 Financial resource strain: Not on file  . Food insecurity:    Worry: Not on file    Inability: Not on file  . Transportation needs:    Medical: Not on file    Non-medical: Not on file  Tobacco Use  . Smoking status: Never Smoker  . Smokeless tobacco: Never Used  Substance and Sexual Activity  . Alcohol use: No  . Drug use: No  . Sexual activity: Never  Lifestyle  . Physical activity:    Days per week: Not on file    Minutes per session: Not on file  . Stress: Not on file  Relationships  . Social connections:    Talks on phone: Not on file    Gets together: Not on file    Attends religious service: Not on file    Active member of club or  organization: Not on file    Attends meetings of clubs or organizations: Not on file    Relationship status: Not on file  . Intimate partner violence:    Fear of current or ex partner: Not on file    Emotionally abused: Not on file    Physically abused: Not on file    Forced sexual activity: Not on file  Other Topics Concern  . 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   Would not want Tube Feeds.   Does not have a living will or HPOA.    Past Surgical History:  Procedure Laterality Date  . EYE SURGERY    . thumb surgery  1999   right    Family History  Problem Relation Age of Onset  . Arthritis Father   . Sleep apnea Father   . Alcohol abuse Brother   . Cancer Brother        cancer  . Cancer Paternal Grandfather        prostate    Allergies  Allergen Reactions  . Demerol [Meperidine] Other (See  Comments)    "I almost died from that."-Severe bradycardia    Current Outpatient Medications on File Prior to Visit  Medication Sig Dispense Refill  . acyclovir (ZOVIRAX) 400 MG tablet Take 400 mg by mouth 2 (two) times daily.  99  . ASPIRIN 81 PO Take 81 mg by mouth 3 (three) times a week.    . Multiple Vitamins-Minerals (PRESERVISION AREDS PO) Take by mouth 2 (two) times daily.    . timolol (TIMOPTIC) 0.5 % ophthalmic solution      No current facility-administered medications on file prior to visit.     BP 116/66   Pulse 60   Temp 97.8 F (36.6 C) (Oral)   Ht 5' 6.5" (1.689 m)   Wt 147 lb 12 oz (67 kg)   SpO2 97%   BMI 23.49 kg/m    Objective:   Physical Exam  Constitutional: He appears well-nourished.  Neck: Normal range of motion. Neck supple. No spinous process tenderness and no muscular tenderness present. Normal range of motion present.  Cardiovascular: Normal rate and regular rhythm.  Respiratory: Effort normal and breath sounds normal.    Musculoskeletal:       Right shoulder: He exhibits decreased range of motion and pain. He exhibits no tenderness, no bony tenderness, no swelling, no deformity and normal strength.       Arms: Mild decrease in ROM with posterior abduction. 5/5 strength to bilateral upper extremities. Negative empty can test  Skin: Skin is warm and dry. No erythema.           Assessment & Plan:  Acute Shoulder Pain:  Located to right shoulder x 2 months. Little intervention himself, has not taken anything OTC. Exam today overall stable, little decrease in ROM.  Symptoms likely secondary to osteoarthritis. Check plain films. Will have him try Tylenol PRN. Stretching exercises provided. Consider PT vs Sports Medicine referral if no improvement.  Pleas Koch, NP

## 2017-11-01 ENCOUNTER — Ambulatory Visit: Payer: Medicare Other | Admitting: Internal Medicine

## 2017-11-02 ENCOUNTER — Ambulatory Visit (INDEPENDENT_AMBULATORY_CARE_PROVIDER_SITE_OTHER): Payer: Medicare Other | Admitting: Primary Care

## 2017-11-02 VITALS — BP 120/76 | HR 66 | Temp 98.0°F | Ht 66.5 in | Wt 149.0 lb

## 2017-11-02 DIAGNOSIS — R739 Hyperglycemia, unspecified: Secondary | ICD-10-CM

## 2017-11-02 DIAGNOSIS — R42 Dizziness and giddiness: Secondary | ICD-10-CM | POA: Diagnosis not present

## 2017-11-02 LAB — BASIC METABOLIC PANEL
BUN: 15 mg/dL (ref 6–23)
CALCIUM: 9.7 mg/dL (ref 8.4–10.5)
CO2: 34 meq/L — AB (ref 19–32)
Chloride: 104 mEq/L (ref 96–112)
Creatinine, Ser: 0.79 mg/dL (ref 0.40–1.50)
GFR: 101.14 mL/min (ref >60.00)
Glucose, Bld: 72 mg/dL (ref 70–99)
Potassium: 4.1 mEq/L (ref 3.5–5.1)
SODIUM: 142 meq/L (ref 135–145)

## 2017-11-02 LAB — HEMOGLOBIN A1C: HEMOGLOBIN A1C: 5.9 % (ref 4.6–6.5)

## 2017-11-02 NOTE — Patient Instructions (Signed)
Stop by the lab prior to leaving today. I will notify you of your results once received.   Ensure you are consuming 64 ounces of water daily.  Make sure to rise from seated positions slowly.  Please notify us if your symptoms return.  It was a pleasure to see you today!

## 2017-11-02 NOTE — Progress Notes (Signed)
Subjective:    Patient ID: Bryan Avila, male    DOB: 06-Nov-1940, 77 y.o.   MRN: [id number]  HPI  Bryan Avila is a 77 year old male with a history of RBBB and hyperlipidemia who presents today with a chief complaint of nausea.   He also reports dizziness. His symptoms occurred three days ago. He was in the bathroom, after being seated for one hour, and felt a sudden onset of dizziness that lasted 10-15 seconds with spontaneous resolve. He didn't feel as though the room was spinning.   He denies vomiting, diarrhea, changes in medication, chest pain, shortness of breath, abdominal pain. He admits that he doesn't drink much water. He's worried that he may have high blood sugar as he was once told this in the past. He's had no episodes of dizziness or nauesa since.   Review of Systems  Constitutional: Negative for fatigue and fever.  Gastrointestinal: Negative for blood in stool, constipation, diarrhea and nausea.  Neurological: Negative for headaches.       10-15 seconds of dizziness three days ago       Past Medical History:  Diagnosis Date  . Cataract 03/2009   left eye  . Hyperlipidemia   . Osteoporosis    osteoarthritis     Social History   Socioeconomic History  . Marital status: Married    Spouse name: Not on file  . Number of children: 1  . Years of education: Not on file  . Highest education level: Not on file  Occupational History  . 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 Financial resource strain: Not on file  . Food insecurity:    Worry: Not on file    Inability: Not on file  . Transportation needs:    Medical: Not on file    Non-medical: Not on file  Tobacco Use  . Smoking status: Never Smoker  . Smokeless tobacco: Never Used  Substance and Sexual Activity  . Alcohol use: No  . Drug use: No  . Sexual activity: Never  Lifestyle  . Physical activity:    Days per week: Not on file    Minutes per session: Not on file  .  Stress: Not on file  Relationships  . Social connections:    Talks on phone: Not on file    Gets together: Not on file    Attends religious service: Not on file    Active member of club or organization: Not on file    Attends meetings of clubs or organizations: Not on file    Relationship status: Not on file  . Intimate partner violence:    Fear of current or ex partner: Not on file    Emotionally abused: Not on file    Physically abused: Not on file    Forced sexual activity: Not on file  Other Topics Concern  . 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   Would not want Tube Feeds.   Does not have a living will or HPOA.    Past Surgical History:  Procedure Laterality Date  . EYE SURGERY    . thumb surgery  1999   right    Family History  Problem Relation Age of Onset  . Arthritis Father   . Sleep apnea Father   . Alcohol abuse Brother   .  Cancer Brother        cancer  . Cancer Paternal Grandfather        prostate    Allergies  Allergen Reactions  . Demerol [Meperidine] Other (See Comments)    "I almost died from that."-Severe bradycardia    Current Outpatient Medications on File Prior to Visit  Medication Sig Dispense Refill  . acyclovir (ZOVIRAX) 400 MG tablet Take 400 mg by mouth 2 (two) times daily.  99  . ASPIRIN 81 PO Take 81 mg by mouth 3 (three) times a week.    . Multiple Vitamins-Minerals (PRESERVISION AREDS PO) Take by mouth 2 (two) times daily.    . timolol (TIMOPTIC) 0.5 % ophthalmic solution      No current facility-administered medications on file prior to visit.     BP 120/76   Pulse 66   Temp 98 F (36.7 C) (Oral)   Ht 5' 6.5" (1.689 m)   Wt 149 lb (67.6 kg)   SpO2 98%   BMI 23.69 kg/m    Objective:   Physical Exam  Constitutional: He is oriented to person, place, and time. He appears well-nourished.  Eyes: EOM are normal.    Neck: Neck supple.  Cardiovascular: Normal rate and regular rhythm.  Respiratory: Effort normal and breath sounds normal.  Neurological: He is alert and oriented to person, place, and time. No cranial nerve deficit. He displays a negative Romberg sign.  Skin: Skin is warm and dry.           Assessment & Plan:  Dizziness:  Occurred while standing, lasted 15 seconds with resolve. No symptoms since. Exam today unremarkable. ECG today with sinus bradycardia, rate of 58. RBBB, no ST elevation or T-wave inversion. ECG same as April 2018 ECG. Orthostatic Vitals: Negative Check BMP and A1C today. Encouraged proper hydration with water, rise slowly from seated positions. He is stable for outpatient treatment.  Pleas Koch, NP

## 2017-12-05 ENCOUNTER — Telehealth: Payer: Self-pay

## 2017-12-05 DIAGNOSIS — E7800 Pure hypercholesterolemia, unspecified: Secondary | ICD-10-CM

## 2017-12-05 DIAGNOSIS — Z125 Encounter for screening for malignant neoplasm of prostate: Secondary | ICD-10-CM

## 2017-12-05 NOTE — Telephone Encounter (Signed)
CPE labs ordered. Forwarding to PCP for approval.  

## 2017-12-06 ENCOUNTER — Encounter: Payer: Self-pay | Admitting: Primary Care

## 2017-12-06 ENCOUNTER — Ambulatory Visit (INDEPENDENT_AMBULATORY_CARE_PROVIDER_SITE_OTHER): Payer: Medicare Other | Admitting: Primary Care

## 2017-12-06 ENCOUNTER — Ambulatory Visit (INDEPENDENT_AMBULATORY_CARE_PROVIDER_SITE_OTHER): Payer: Medicare Other

## 2017-12-06 ENCOUNTER — Ambulatory Visit: Payer: Medicare Other | Admitting: Family Medicine

## 2017-12-06 VITALS — BP 124/86 | HR 65 | Temp 97.6°F | Ht 66.5 in | Wt 145.5 lb

## 2017-12-06 DIAGNOSIS — M19019 Primary osteoarthritis, unspecified shoulder: Secondary | ICD-10-CM | POA: Diagnosis not present

## 2017-12-06 DIAGNOSIS — E7800 Pure hypercholesterolemia, unspecified: Secondary | ICD-10-CM | POA: Diagnosis not present

## 2017-12-06 DIAGNOSIS — Z125 Encounter for screening for malignant neoplasm of prostate: Secondary | ICD-10-CM | POA: Diagnosis not present

## 2017-12-06 DIAGNOSIS — H9193 Unspecified hearing loss, bilateral: Secondary | ICD-10-CM

## 2017-12-06 DIAGNOSIS — B009 Herpesviral infection, unspecified: Secondary | ICD-10-CM | POA: Diagnosis not present

## 2017-12-06 DIAGNOSIS — Z0000 Encounter for general adult medical examination without abnormal findings: Secondary | ICD-10-CM

## 2017-12-06 LAB — LIPID PANEL
CHOLESTEROL: 206 mg/dL — AB (ref 0–200)
HDL: 79 mg/dL (ref >39.00)
LDL CALC: 115 mg/dL — AB (ref 0–99)
NonHDL: 127.24
TRIGLYCERIDES: 60 mg/dL (ref 0.0–149.0)
Total CHOL/HDL Ratio: 3
VLDL: 12 mg/dL (ref 0.0–40.0)

## 2017-12-06 LAB — PSA, MEDICARE: PSA: 0.97 ng/mL (ref 0.10–4.00)

## 2017-12-06 NOTE — Progress Notes (Signed)
Subjective:    Patient ID: Bryan Avila, male    DOB: 08-29-1940, 77 y.o.   MRN: [id number]  HPI  Mr. Cambria is a 77 year old male who presents today for [place] Part 2. He saw our health advisor this morning. He has no complaints today.   Immunizations: -Tetanus: Completed in 2016 -Influenza: Completed last season -Pneumonia: Completed in 2015 -Shingles: Completed in 2011  Diet: He endorses a healthy diet Breakfast: Waffles, breakfast burrito, breakfast sandwich Lunch: Canned food, frozen dinners  Dinner: Vegetables, meat  Snacks: Fiber One Bar, crackers Desserts: Occasionally, sugar free ice cream and cookies Beverages: Milk, diet soda, some water, diet sweet tea  Exercise: He is walking for exercise, sometimes works on the elliptical  Colonoscopy: Completed in 2012 PSA: Pending today    Review of Systems  Constitutional: Negative for unexpected weight change.  HENT: Negative for rhinorrhea.   Respiratory: Negative for cough and shortness of breath.   Cardiovascular: Negative for chest pain.  Gastrointestinal: Negative for constipation and diarrhea.  Genitourinary: Negative for difficulty urinating.  Musculoskeletal: Negative for arthralgias and myalgias.  Skin: Negative for rash.       Following with dermatology   Allergic/Immunologic: Negative for environmental allergies.  Neurological: Negative for dizziness, numbness and headaches.  Psychiatric/Behavioral: The patient is not nervous/anxious.    Compliant to acyclovir twice daily to control eye pressures. Following with ophthalmology.      Past Medical History:  Diagnosis Date  . Cataract 03/2009   left eye  . Hyperlipidemia   . Osteoporosis    osteoarthritis     Social History   Socioeconomic History  . Marital status: Married    Spouse name: Not on file  . Number of children: 1  . Years of education: Not on file  . Highest education level: Not on file  Occupational History  . Occupation: Truck  Neurosurgeon HV/AC   Social Needs  . Financial resource strain: Not on file  . Food insecurity:    Worry: Not on file    Inability: Not on file  . Transportation needs:    Medical: Not on file    Non-medical: Not on file  Tobacco Use  . Smoking status: Never Smoker  . Smokeless tobacco: Never Used  Substance and Sexual Activity  . Alcohol use: No  . Drug use: No  . Sexual activity: Never  Lifestyle  . Physical activity:    Days per week: Not on file    Minutes per session: Not on file  . Stress: Not on file  Relationships  . Social connections:    Talks on phone: Not on file    Gets together: Not on file    Attends religious service: Not on file    Active member of club or organization: Not on file    Attends meetings of clubs or organizations: Not on file    Relationship status: Not on file  . Intimate partner violence:    Fear of current or ex partner: Not on file    Emotionally abused: Not on file    Physically abused: Not on file    Forced sexual activity: Not on file  Other Topics Concern  . 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   Would not want Tube Feeds.   Does not have a living will or HPOA.  Past Surgical History:  Procedure Laterality Date  . EYE SURGERY    . thumb surgery  1999   right    Family History  Problem Relation Age of Onset  . Arthritis Father   . Sleep apnea Father   . Alcohol abuse Brother   . Cancer Brother        cancer  . Cancer Paternal Grandfather        prostate    Allergies  Allergen Reactions  . Demerol [Meperidine] Other (See Comments)    "I almost died from that."-Severe bradycardia    Current Outpatient Medications on File Prior to Visit  Medication Sig Dispense Refill  . acyclovir (ZOVIRAX) 400 MG tablet Take 400 mg by mouth 2 (two) times daily.  99  . ASPIRIN 81 PO Take 81 mg by mouth 3  (three) times a week.    . Multiple Vitamins-Minerals (PRESERVISION AREDS PO) Take by mouth 2 (two) times daily.    . timolol (TIMOPTIC) 0.5 % ophthalmic solution      No current facility-administered medications on file prior to visit.     Temp 97.6 F (36.4 C) (Oral)   Ht 5' 6.5" (1.689 m) Comment: no shoes  Wt 145 lb 8 oz (66 kg)   SpO2 94%   BMI 23.13 kg/m    Objective:   Physical Exam  Constitutional: He is oriented to person, place, and time. He appears well-nourished.  HENT:  Mouth/Throat: No oropharyngeal exudate.  Eyes: Pupils are equal, round, and reactive to light. EOM are normal.  Neck: Neck supple.  Cardiovascular: Normal rate and regular rhythm.  Respiratory: Effort normal and breath sounds normal.  GI: Soft. Bowel sounds are normal. There is no tenderness.  Musculoskeletal: Normal range of motion.  Neurological: He is alert and oriented to person, place, and time.  Skin: Skin is warm and dry.  Psychiatric: He has a normal mood and affect.           Assessment & Plan:

## 2017-12-06 NOTE — Patient Instructions (Signed)
Mr. Bryan Avila , Thank you for taking time to come for your Medicare Wellness Visit. I appreciate your ongoing commitment to your health goals. Please review the following plan we discussed and let me know if I can assist you in the future.   These are the goals we discussed: Goals    . Increase physical activity     Starting 12/06/2017, I will continue to walk at least 30 minutes daily.        This is a list of the screening recommended for you and due dates:  Health Maintenance  Topic Date Due  . Flu Shot  12/08/2017  . Tetanus Vaccine  12/15/2024  . Pneumonia vaccines  Completed   Preventive Care for Adults  A healthy lifestyle and preventive care can promote health and wellness. Preventive health guidelines for adults include the following key practices.  . A routine yearly physical is a good way to check with your health care provider about your health and preventive screening. It is a chance to share any concerns and updates on your health and to receive a thorough exam.  . Visit your dentist for a routine exam and preventive care every 6 months. Brush your teeth twice a day and floss once a day. Good oral hygiene prevents tooth decay and gum disease.  . The frequency of eye exams is based on your age, health, family medical history, use  of contact lenses, and other factors. Follow your health care provider's recommendations for frequency of eye exams.  . Eat a healthy diet. Foods like vegetables, fruits, whole grains, low-fat dairy products, and lean protein foods contain the nutrients you need without too many calories. Decrease your intake of foods high in solid fats, added sugars, and salt. Eat the right amount of calories for you. Get information about a proper diet from your health care provider, if necessary.  . Regular physical exercise is one of the most important things you can do for your health. Most adults should get at least 150 minutes of moderate-intensity exercise  (any activity that increases your heart rate and causes you to sweat) each week. In addition, most adults need muscle-strengthening exercises on 2 or more days a week.  Silver Sneakers may be a benefit available to you. To determine eligibility, you may visit the website: www.silversneakers.com or contact program at 929-804-1840 Mon-Fri between 8AM-8PM.   . Maintain a healthy weight. The body mass index (BMI) is a screening tool to identify possible weight problems. It provides an estimate of body fat based on height and weight. Your health care provider can find your BMI and can help you achieve or maintain a healthy weight.   For adults 20 years and older: ? A BMI below 18.5 is considered underweight. ? A BMI of 18.5 to 24.9 is normal. ? A BMI of 25 to 29.9 is considered overweight. ? A BMI of 30 and above is considered obese.   . Maintain normal blood lipids and cholesterol levels by exercising and minimizing your intake of saturated fat. Eat a balanced diet with plenty of fruit and vegetables. Blood tests for lipids and cholesterol should begin at age 52 and be repeated every 5 years. If your lipid or cholesterol levels are high, you are over 50, or you are at high risk for heart disease, you may need your cholesterol levels checked more frequently. Ongoing high lipid and cholesterol levels should be treated with medicines if diet and exercise are not working.  . If  you smoke, find out from your health care provider how to quit. If you do not use tobacco, please do not start.  . If you choose to drink alcohol, please do not consume more than 2 drinks per day. One drink is considered to be 12 ounces (355 mL) of beer, 5 ounces (148 mL) of wine, or 1.5 ounces (44 mL) of liquor.  . If you are 30-32 years old, ask your health care provider if you should take aspirin to prevent strokes.  . Use sunscreen. Apply sunscreen liberally and repeatedly throughout the day. You should seek shade when your  shadow is shorter than you. Protect yourself by wearing long sleeves, pants, a wide-brimmed hat, and sunglasses year round, whenever you are outdoors.  . Once a month, do a whole body skin exam, using a mirror to look at the skin on your back. Tell your health care provider of new moles, moles that have irregular borders, moles that are larger than a pencil eraser, or moles that have changed in shape or color.

## 2017-12-06 NOTE — Patient Instructions (Signed)
Continue exercising. You should be getting 150 minutes of moderate intensity exercise weekly.  Make sure to eat fresh fruit vegetables, fruit, whole grains, lean protein.  Ensure you are consuming 64 ounces of water daily.  We will be in touch once we receive your lab results.   Follow up in 1 year for your annual exam or sooner if needed.  It was a pleasure to see you today!

## 2017-12-06 NOTE — Telephone Encounter (Signed)
Approved.  

## 2017-12-06 NOTE — Assessment & Plan Note (Signed)
Stable, denies significant changes. Failed hearing screening test this morning.

## 2017-12-06 NOTE — Assessment & Plan Note (Signed)
Lipid panel pending. Recommended he limit canned and frozen food, increase fresh food. Commended him on exercise.

## 2017-12-06 NOTE — Assessment & Plan Note (Signed)
Chronic, to left eye. Taking acyclovir as prescribed. Following with ophthalmology.

## 2017-12-06 NOTE — Progress Notes (Signed)
I reviewed health advisor's note, was available for consultation, and agree with documentation and plan.  

## 2017-12-06 NOTE — Progress Notes (Signed)
PCP notes:   Health maintenance:  No gaps identified.   Abnormal screenings:   Hearing - failed  Hearing Screening   125Hz  250Hz  500Hz  1000Hz  2000Hz  3000Hz  4000Hz  6000Hz  8000Hz   Right ear:   40 40 0  0    Left ear:   40 40 0  0     Patient concerns:   None  Nurse concerns:  None  Next PCP appt:   12/06/2017 @ 0900

## 2017-12-06 NOTE — Progress Notes (Signed)
Subjective:   Bryan Avila is a 77 y.o. male who presents for Medicare Annual/Subsequent preventive examination.  Review of Systems:  N/A Cardiac Risk Factors include: advanced age (>9men, >16 women);male gender;dyslipidemia     Objective:    Vitals: BP 124/86 (BP Location: Right Arm, Patient Position: Sitting, Cuff Size: Normal)   Pulse 65   Temp 97.6 F (36.4 C) (Oral)   Ht 5' 6.5" (1.689 m) Comment: no shoes  Wt 145 lb 8 oz (66 kg)   SpO2 94%   BMI 23.13 kg/m   Body mass index is 23.13 kg/m.  Advanced Directives 12/06/2017 11/25/2016 12/16/2014  Does Patient Have a Medical Advance Directive? No No No  Does patient want to make changes to medical advance directive? - Yes (MAU/Ambulatory/Procedural Areas - Information given) -  Would patient like information on creating a medical advance directive? No - Patient declined - -    Tobacco Social History   Tobacco Use  Smoking Status Never Smoker  Smokeless Tobacco Never Used     Counseling given: No   Clinical Intake:  Pre-visit preparation completed: Yes  Pain : No/denies pain Pain Score: 0-No pain     Nutritional Status: BMI 25 -29 Overweight Nutritional Risks: None Diabetes: No  How often do you need to have someone help you when you read instructions, pamphlets, or other written materials from your doctor or pharmacy?: 1 - Never What is the last grade level you completed in school?: 12th grade + 1.5 yrs college  Interpreter Needed?: No  Comments: pt lives with spouse Information entered by :: LPinson, LPN  Past Medical History:  Diagnosis Date  . Cataract 03/2009   left eye  . Hyperlipidemia   . Osteoporosis    osteoarthritis   Past Surgical History:  Procedure Laterality Date  . EYE SURGERY    . thumb surgery  1999   right   Family History  Problem Relation Age of Onset  . Arthritis Father   . Sleep apnea Father   . Alcohol abuse Brother   . Cancer Brother        cancer  . Cancer  Paternal Grandfather        prostate   Social History   Socioeconomic History  . Marital status: Married    Spouse name: Not on file  . Number of children: 1  . Years of education: Not on file  . Highest education level: Not on file  Occupational History  . 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 Financial resource strain: Not on file  . Food insecurity:    Worry: Not on file    Inability: Not on file  . Transportation needs:    Medical: Not on file    Non-medical: Not on file  Tobacco Use  . Smoking status: Never Smoker  . Smokeless tobacco: Never Used  Substance and Sexual Activity  . Alcohol use: No  . Drug use: No  . Sexual activity: Never  Lifestyle  . Physical activity:    Days per week: Not on file    Minutes per session: Not on file  . Stress: Not on file  Relationships  . Social connections:    Talks on phone: Not on file    Gets together: Not on file    Attends religious service: Not on file    Active member of club or organization: Not on file    Attends meetings of clubs or organizations: Not on  file    Relationship status: Not on file  Other Topics Concern  . 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   Would not want Tube Feeds.   Does not have a living will or HPOA.    Outpatient Encounter Medications as of 12/06/2017  Medication Sig  . acyclovir (ZOVIRAX) 400 MG tablet Take 400 mg by mouth 2 (two) times daily.  . ASPIRIN 81 PO Take 81 mg by mouth 3 (three) times a week.  . Multiple Vitamins-Minerals (PRESERVISION AREDS PO) Take by mouth 2 (two) times daily.  . timolol (TIMOPTIC) 0.5 % ophthalmic solution    No facility-administered encounter medications on file as of 12/06/2017.     Activities of Daily Living In your present state of health, do you have any difficulty performing the following activities:  12/06/2017  Hearing? Y  Vision? N  Difficulty concentrating or making decisions? N  Walking or climbing stairs? N  Dressing or bathing? N  Doing errands, shopping? N  Preparing Food and eating ? N  Using the Toilet? N  In the past six months, have you accidently leaked urine? N  Do you have problems with loss of bowel control? N  Managing your Medications? N  Managing your Finances? N  Housekeeping or managing your Housekeeping? N  Some recent data might be hidden    Patient Care Team: Pleas Koch, NP as PCP - General (Internal Medicine) Jari Pigg, MD as Consulting Physician (Dermatology) Luberta Mutter, MD as Consulting Physician (Ophthalmology) Minna Merritts, MD as Consulting Physician (Cardiology)   Assessment:   This is a routine wellness examination for Majid.   Hearing Screening   125Hz  250Hz  500Hz  1000Hz  2000Hz  3000Hz  4000Hz  6000Hz  8000Hz   Right ear:   40 40 0  0    Left ear:   40 40 0  0    Vision Screening Comments: March 2019 @ [facility]/Dr. Ellie Lunch   Exercise Activities and Dietary recommendations Current Exercise Habits: Home exercise routine, Type of exercise: walking, Time (Minutes): 30, Frequency (Times/Week): 7, Weekly Exercise (Minutes/Week): 210, Intensity: Moderate, Exercise limited by: None identified  Goals    . Increase physical activity     Starting 12/06/2017, I will continue to walk at least 30 minutes daily.        Fall Risk Fall Risk  12/06/2017 11/25/2016 11/25/2015 11/21/2014 11/19/2013  Falls in the past year? No No Yes No No  Number falls in past yr: - - 1 - -   Depression Screen PHQ 2/9 Scores 12/06/2017 11/25/2016 11/25/2015 11/21/2014  PHQ - 2 Score 0 0 0 0  PHQ- 9 Score 0 - - -    Cognitive Function MMSE - Mini Mental State Exam 12/06/2017 11/25/2016  Orientation to time 5 5  Orientation to Place 5 5  Registration 3 3  Attention/ Calculation 0 0  Recall 3 3  Language- name 2 objects 0 0  Language- repeat  1 1  Language- follow 3 step command 3 3  Language- read & follow direction 0 0  Write a sentence 0 0  Copy design 0 0  Total score 20 20     PLEASE NOTE: A Mini-Cog screen was completed. Maximum score is 20. A value of 0 denotes this part of Folstein MMSE was not completed or the patient failed this part of the Mini-Cog screening.  Mini-Cog Screening Orientation to Time - Max 5 pts Orientation to Place - Max 5 pts Registration - Max 3 pts Recall - Max 3 pts Language Repeat - Max 1 pts Language Follow 3 Step Command - Max 3 pts     Immunization History  Administered Date(s) Administered  . Influenza Whole 04/09/2000, 03/10/2007, 02/07/2008, 02/12/2009, 02/10/2010  . Influenza, High Dose Seasonal PF 01/28/2015, 01/24/2016, 02/04/2017  . Influenza-Unspecified 02/04/2017  . Pneumococcal Conjugate-13 11/19/2013  . Pneumococcal Polysaccharide-23 03/31/2007  . Td 05/10/1996, 10/28/2006  . Tdap 12/16/2014  . Zoster 03/25/2010    Screening Tests Health Maintenance  Topic Date Due  . INFLUENZA VACCINE  12/08/2017  . TETANUS/TDAP  12/15/2024  . PNA vac Low Risk Adult  Completed    Plan:   I have personally reviewed, addressed, and noted the following in the patient's chart:  A. Medical and social history B. Use of alcohol, tobacco or illicit drugs  C. Current medications and supplements D. Functional ability and status E.  Nutritional status F.  Physical activity G. Advance directives H. List of other physicians I.  Hospitalizations, surgeries, and ER visits in previous 12 months J.  [place] to include hearing, vision, cognitive, depression L. Referrals and appointments - none  In addition, I have reviewed and discussed with patient certain preventive protocols, quality metrics, and best practice recommendations. A written personalized care plan for preventive services as well as general preventive health recommendations were provided to patient.  See  attached scanned questionnaire for additional information.   Signed,   Lindell Noe, MHA, BS, LPN Health Coach

## 2017-12-06 NOTE — Assessment & Plan Note (Signed)
Overall stable, remaining active.

## 2018-07-05 DIAGNOSIS — C4491 Basal cell carcinoma of skin, unspecified: Secondary | ICD-10-CM

## 2018-07-05 HISTORY — DX: Basal cell carcinoma of skin, unspecified: C44.91

## 2018-07-06 ENCOUNTER — Ambulatory Visit (INDEPENDENT_AMBULATORY_CARE_PROVIDER_SITE_OTHER)
Admission: RE | Admit: 2018-07-06 | Discharge: 2018-07-06 | Disposition: A | Discharge status: Home / Self Care | Payer: Medicare Other | Source: Ambulatory Visit | Attending: Primary Care | Admitting: Primary Care

## 2018-07-06 ENCOUNTER — Ambulatory Visit (INDEPENDENT_AMBULATORY_CARE_PROVIDER_SITE_OTHER): Payer: Medicare Other | Admitting: Primary Care

## 2018-07-06 ENCOUNTER — Encounter: Payer: Self-pay | Admitting: Primary Care

## 2018-07-06 VITALS — BP 120/74 | HR 64 | Temp 98.5°F | Ht 66.5 in | Wt 147.5 lb

## 2018-07-06 DIAGNOSIS — R05 Cough: Secondary | ICD-10-CM

## 2018-07-06 DIAGNOSIS — R053 Chronic cough: Secondary | ICD-10-CM

## 2018-07-06 HISTORY — DX: Chronic cough: R05.3

## 2018-07-06 NOTE — Assessment & Plan Note (Signed)
Sounds to be an annual occurrence during colder seasons. Differential diagnoses include COPD (from second hand smoke), allergies, GERD, reactive airway disease. Start with chest xray today. Start Zyrtec daily during colder months.  Discussed for him to update me in 2 weeks, if no improvement then consider spirometry for further evaluation.  He appears well today, lungs clear.

## 2018-07-06 NOTE — Progress Notes (Signed)
Subjective:    Patient ID: Bryan Avila, male    DOB: 15-Nov-1940, 78 y.o.   MRN: [id number]  HPI  Bryan Avila is a 78 year old male who presents today with a chief complaint of cough.   His cough began around early December 2019 this season. He will experience this cough annually during colder months, symptoms dissipate during Spring/Summer months. He describes his cough as dry that is intermittent during the day. He denies fevers, chills, body aches, esophageal burning, epigastric pain, belching, shortness of breath, post nasal drip. He does endorse a dry throat, takes throat lozenges 3-6 daily during cold season. He was exposed to second hand smoke from both parents when growing up. Denies history of personal tobacco abuse. He has been exposed to various irritants/tobacco during his time in the [employer] and work career.   He does have intermittent rhinorrhea during seasonal changes.   Review of Systems  Constitutional: Negative for unexpected weight change.  HENT: Negative for congestion and postnasal drip.   Respiratory: Positive for cough. Negative for shortness of breath and wheezing.   Gastrointestinal: Negative for abdominal pain.       Denies GERD symptoms  Allergic/Immunologic: Positive for environmental allergies.       Past Medical History:  Diagnosis Date  . Cataract 03/2009   left eye  . Hyperlipidemia   . Osteoporosis    osteoarthritis     Social History   Socioeconomic History  . Marital status: Married    Spouse name: Not on file  . Number of children: 1  . Years of education: Not on file  . Highest education level: Not on file  Occupational History  . 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 Financial resource strain: Not on file  . Food insecurity:    Worry: Not on file    Inability: Not on file  . Transportation needs:    Medical: Not on file    Non-medical: Not on file  Tobacco Use  . Smoking status: Never Smoker  .  Smokeless tobacco: Never Used  Substance and Sexual Activity  . Alcohol use: No  . Drug use: No  . Sexual activity: Never  Lifestyle  . Physical activity:    Days per week: Not on file    Minutes per session: Not on file  . Stress: Not on file  Relationships  . Social connections:    Talks on phone: Not on file    Gets together: Not on file    Attends religious service: Not on file    Active member of club or organization: Not on file    Attends meetings of clubs or organizations: Not on file    Relationship status: Not on file  . Intimate partner violence:    Fear of current or ex partner: Not on file    Emotionally abused: Not on file    Physically abused: Not on file    Forced sexual activity: Not on file  Other Topics Concern  . 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   Would not want Tube Feeds.   Does not have a living will or HPOA.    Past Surgical History:  Procedure Laterality Date  . EYE SURGERY    . thumb surgery  1999   right    Family History  Problem Relation Age of Onset  . Arthritis Father   . Sleep apnea Father   . Alcohol abuse Brother   . Cancer Brother        cancer  . Cancer Paternal Grandfather        prostate    Allergies  Allergen Reactions  . Demerol [Meperidine] Other (See Comments)    "I almost died from that."-Severe bradycardia    Current Outpatient Medications on File Prior to Visit  Medication Sig Dispense Refill  . acyclovir (ZOVIRAX) 400 MG tablet Take 400 mg by mouth 2 (two) times daily.  99  . ASPIRIN 81 PO Take 81 mg by mouth 3 (three) times a week.    . Multiple Vitamins-Minerals (PRESERVISION AREDS PO) Take by mouth 2 (two) times daily.    . timolol (TIMOPTIC) 0.5 % ophthalmic solution      No current facility-administered medications on file prior to visit.     BP 120/74   Pulse 64   Temp 98.5 F (36.9 C)  (Oral)   Ht 5' 6.5" (1.689 m)   Wt 147 lb 8 oz (66.9 kg)   SpO2 98%   BMI 23.45 kg/m    Objective:   Physical Exam  Constitutional: He appears well-nourished. He does not appear ill.  HENT:  Right Ear: Tympanic membrane and ear canal normal.  Left Ear: Tympanic membrane and ear canal normal.  Nose: No mucosal edema. Right sinus exhibits no maxillary sinus tenderness and no frontal sinus tenderness. Left sinus exhibits no maxillary sinus tenderness and no frontal sinus tenderness.  Mouth/Throat: Oropharynx is clear and moist.  Neck: Neck supple.  Cardiovascular: Normal rate and regular rhythm.  Respiratory: Effort normal and breath sounds normal. He has no wheezes.  Skin: Skin is warm and dry.           Assessment & Plan:

## 2018-07-06 NOTE — Patient Instructions (Signed)
Start Zyrtec nightly for allergies and chronic cough.  Complete xray(s) prior to leaving today. I will notify you of your results once received.  Please update me in 2 weeks regarding your cough symptoms.   It was a pleasure to see you today!

## 2018-10-17 ENCOUNTER — Other Ambulatory Visit: Payer: Self-pay | Admitting: Primary Care

## 2018-10-17 DIAGNOSIS — Z1211 Encounter for screening for malignant neoplasm of colon: Secondary | ICD-10-CM

## 2018-12-19 ENCOUNTER — Ambulatory Visit (INDEPENDENT_AMBULATORY_CARE_PROVIDER_SITE_OTHER): Payer: Medicare Other | Admitting: Primary Care

## 2018-12-19 ENCOUNTER — Other Ambulatory Visit: Payer: Self-pay

## 2018-12-19 ENCOUNTER — Ambulatory Visit: Payer: Medicare Other

## 2018-12-19 ENCOUNTER — Encounter: Payer: Self-pay | Admitting: Primary Care

## 2018-12-19 VITALS — BP 120/72 | HR 58 | Temp 98.6°F | Ht 66.5 in | Wt 150.8 lb

## 2018-12-19 DIAGNOSIS — M19019 Primary osteoarthritis, unspecified shoulder: Secondary | ICD-10-CM

## 2018-12-19 DIAGNOSIS — I4510 Unspecified right bundle-branch block: Secondary | ICD-10-CM | POA: Diagnosis not present

## 2018-12-19 DIAGNOSIS — Z125 Encounter for screening for malignant neoplasm of prostate: Secondary | ICD-10-CM | POA: Diagnosis not present

## 2018-12-19 DIAGNOSIS — Z0000 Encounter for general adult medical examination without abnormal findings: Secondary | ICD-10-CM | POA: Diagnosis not present

## 2018-12-19 DIAGNOSIS — E7800 Pure hypercholesterolemia, unspecified: Secondary | ICD-10-CM

## 2018-12-19 DIAGNOSIS — H9193 Unspecified hearing loss, bilateral: Secondary | ICD-10-CM

## 2018-12-19 DIAGNOSIS — B009 Herpesviral infection, unspecified: Secondary | ICD-10-CM

## 2018-12-19 LAB — COMPREHENSIVE METABOLIC PANEL
ALT: 10 U/L (ref 0–53)
AST: 13 U/L (ref 0–37)
Albumin: 4 g/dL (ref 3.5–5.2)
Alkaline Phosphatase: 82 U/L (ref 39–117)
BUN: 18 mg/dL (ref 6–23)
CO2: 31 mEq/L (ref 19–32)
Calcium: 9.6 mg/dL (ref 8.4–10.5)
Chloride: 103 mEq/L (ref 96–112)
Creatinine, Ser: 0.78 mg/dL (ref 0.40–1.50)
GFR: 96.28 mL/min (ref >60.00)
Glucose, Bld: 81 mg/dL (ref 70–99)
Potassium: 4.4 mEq/L (ref 3.5–5.1)
Sodium: 140 mEq/L (ref 135–145)
Total Bilirubin: 0.5 mg/dL (ref 0.2–1.2)
Total Protein: 6.4 g/dL (ref 6.0–8.3)

## 2018-12-19 LAB — PSA, MEDICARE: PSA: 0.74 ng/ml (ref 0.10–4.00)

## 2018-12-19 LAB — LIPID PANEL
Cholesterol: 180 mg/dL (ref 0–200)
HDL: 65.2 mg/dL (ref >39.00)
LDL Cholesterol: 90 mg/dL (ref 0–99)
NonHDL: 114.31
Total CHOL/HDL Ratio: 3
Triglycerides: 122 mg/dL (ref 0.0–149.0)
VLDL: 24.4 mg/dL (ref 0.0–40.0)

## 2018-12-19 NOTE — Assessment & Plan Note (Signed)
Immunizations UTD, PSA pending. No longer to complete colonoscopy due to age, recently evaluated by GI. Recommended continued exercise, healthy diet. Exam unremarkable. Labs pending. All recommendations provided at end of visit.  I have personally reviewed and have noted: 1. The patient's medical and social history 2. Their use of alcohol, tobacco or illicit drugs 3. Their current medications and supplements 4. The patient's functional ability including ADL's, fall risks, home  safety risks and hearing or visual impairment. 5. Diet and physical activities 6. Evidence for depression or mood disorder

## 2018-12-19 NOTE — Assessment & Plan Note (Signed)
Doing well on acyclovir BID, following with ophthalmology.

## 2018-12-19 NOTE — Patient Instructions (Addendum)
Stop by the lab prior to leaving today. I will notify you of your results once received.   Be sure to stretch your legs for 10 minutes just prior to bedtime. You can also try taking Tylenol as needed for pain. Please notify me if the pain starts to bother you during the day.   Continue exercising. You should be getting 150 minutes of exercise weekly.  It's important to improve your diet by reducing consumption of fast food, fried food, processed snack foods, sugary drinks. Increase consumption of fresh vegetables and fruits, whole grains, water.  Ensure you are drinking 64 ounces of water daily.  It was a pleasure to see you today!

## 2018-12-19 NOTE — Assessment & Plan Note (Signed)
Chronic, asymptomatic. Following with cardiology on an as needed basis.

## 2018-12-19 NOTE — Assessment & Plan Note (Signed)
Repeat lipid panel pending, commended him on regular exercise and healthy diet.

## 2018-12-19 NOTE — Assessment & Plan Note (Addendum)
Chronic, overall stable without medications. Suspect right lower extremity pain at night from either osteoarthritis or early restless leg syndrome? Advised he work on stretching prior to bedtime, has no pain during the day but he will update if this changes.

## 2018-12-19 NOTE — Assessment & Plan Note (Signed)
Chronic, no cerumen impaction today. Failed whisper testing.

## 2018-12-19 NOTE — Progress Notes (Signed)
Patient ID: Bryan Avila, male   DOB: 1940-08-31, 78 y.o.   MRN: [id number]  HPI: Bryan Avila is a 78 year old male who presents today for MWV.  Past Medical History:  Diagnosis Date  . Cataract 03/2009   left eye  . Hyperlipidemia   . Osteoporosis    osteoarthritis    Current Outpatient Medications  Medication Sig Dispense Refill  . acyclovir (ZOVIRAX) 400 MG tablet Take 400 mg by mouth 2 (two) times daily.  99  . ASPIRIN 81 PO Take 81 mg by mouth 3 (three) times a week.    . Multiple Vitamins-Minerals (PRESERVISION AREDS PO) Take by mouth 2 (two) times daily.    . timolol (TIMOPTIC) 0.5 % ophthalmic solution Place 1 drop into the right eye daily.      No current facility-administered medications for this visit.     Allergies  Allergen Reactions  . Demerol [Meperidine] Other (See Comments)    "I almost died from that."-Severe bradycardia    Family History  Problem Relation Age of Onset  . Arthritis Father   . Sleep apnea Father   . Alcohol abuse Brother   . Cancer Brother        cancer  . Cancer Paternal Grandfather        prostate    Social History   Socioeconomic History  . Marital status: Married    Spouse name: Not on file  . Number of children: 1  . Years of education: Not on file  . Highest education level: Not on file  Occupational History  . 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 Financial resource strain: Not on file  . Food insecurity    Worry: Not on file    Inability: Not on file  . Transportation needs    Medical: Not on file    Non-medical: Not on file  Tobacco Use  . Smoking status: Never Smoker  . Smokeless tobacco: Never Used  Substance and Sexual Activity  . Alcohol use: No  . Drug use: No  . Sexual activity: Never  Lifestyle  . Physical activity    Days per week: Not on file    Minutes per session: Not on file  . Stress: Not on file  Relationships  . Social Herbalist on phone: Not on file     Gets together: Not on file    Attends religious service: Not on file    Active member of club or organization: Not on file    Attends meetings of clubs or organizations: Not on file    Relationship status: Not on file  . Intimate partner violence    Fear of current or ex partner: Not on file    Emotionally abused: Not on file    Physically abused: Not on file    Forced sexual activity: Not on file  Other Topics Concern  . 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   Would not want Tube Feeds.   Does not have a living will or HPOA.    Hospitiliaztions: None  Health Maintenance:    Flu: Due this season   Tetanus: Completed in 2016  Pneumovax: Completed in 2008  Prevnar: Completed in 2015  Zostavax: Completed in 2011  Colonoscopy: Completed in 2012, not needed due to age.  Eye Doctor:  Completed in 2019  Dental Exam: Completes semi-annually  PSA: 0.97 in 2019     Providers: Alma Friendly, PCP; Dr. Tammi Klippel, GI; Dermatology   I have personally reviewed and have noted: 1. The patient's medical and social history 2. Their use of alcohol, tobacco or illicit drugs 3. Their current medications and supplements 4. The patient's functional ability including ADL's, fall risks, home  safety risks and hearing or visual impairment. 5. Diet and physical activities 6. Evidence for depression or mood disorder  Subjective:   Review of Systems:   Constitutional: Denies fever, malaise, fatigue, headache or abrupt weight changes.  HEENT: Denies eye pain, eye redness, ear pain, ringing in the ears, wax buildup, runny nose, nasal congestion, bloody nose, or sore throat. Respiratory: Denies difficulty breathing, shortness of breath, cough or sputum production.   Cardiovascular: Denies chest pain, chest tightness, palpitations or swelling in the hands or feet.  Gastrointestinal:  Denies abdominal pain, bloating, constipation, diarrhea or blood in the stool.  GU: Denies urgency, pain with urination, burning sensation, blood in urine, odor or discharge. Occasional nocturia. Musculoskeletal: Denies decrease in range of motion, difficulty with gait. Intermittent right lower extremity pain from anterior knee down to anterior ankle at night when laying down. Denies pain with ambulation.  Skin: Denies new redness, rashes, lesions or ulcercations. Following with dermatology annually.  Neurological: Denies difficulty with memory, difficulty with speech or problems with balance and coordination.  Psychiatric: Denies concerns for anxiety or depression.   No other specific complaints in a complete review of systems (except as listed in HPI above).  Objective:  PE:   BP 120/72   Pulse (!) 58   Temp 98.6 F (37 C) (Temporal)   Ht 5' 6.5" (1.689 m)   Wt 150 lb 12 oz (68.4 kg)   SpO2 98%   BMI 23.97 kg/m  Wt Readings from Last 3 Encounters:  12/19/18 150 lb 12 oz (68.4 kg)  07/06/18 147 lb 8 oz (66.9 kg)  12/06/17 145 lb 8 oz (66 kg)    General: Appears their stated age, well developed, well nourished in NAD. Skin: Warm, dry and intact. No rashes, lesions or ulcerations noted. HEENT: Head: normal shape and size; Eyes: sclera white, no icterus, conjunctiva pink, PERRLA and EOMs intact; Ears: Tm's gray and intact, normal light reflex; Nose: mucosa pink and moist, septum midline; Throat/Mouth: Teeth present, mucosa pink and moist, no exudate, lesions or ulcerations noted.  Neck: Normal range of motion. Neck supple, trachea midline. No massses, lumps or thyromegaly present.  Cardiovascular: Normal rate and rhythm. S1,S2 noted.  No murmur, rubs or gallops noted. No JVD or BLE edema. No carotid bruits noted. Pulmonary/Chest: Normal effort and positive vesicular breath sounds. No respiratory distress. No wheezes, rales or ronchi noted.  Abdomen: Soft and nontender. Normal bowel  sounds, no bruits noted. No distention or masses noted. Liver, spleen and kidneys non palpable. Musculoskeletal: Normal range of motion. No signs of joint swelling. No difficulty with gait.  Neurological: Alert and oriented. Cranial nerves II-XII intact. Coordination normal. +DTRs bilaterally. Psychiatric: Mood and affect normal. Behavior is normal. Judgment and thought content normal.    BMET    Component Value Date/Time   NA 142 11/02/2017 0939   K 4.1 11/02/2017 0939   CL 104 11/02/2017 0939   CO2 34 (H) 11/02/2017 0939   GLUCOSE 72 11/02/2017 0939   BUN 15 11/02/2017 0939   CREATININE 0.79 11/02/2017 0939   CALCIUM 9.7 11/02/2017 0939  GFRNONAA >60 04/24/2016 1015   GFRAA >60 04/24/2016 1015    Lipid Panel     Component Value Date/Time   CHOL 206 (H) 12/06/2017 0849   TRIG 60.0 12/06/2017 0849   HDL 79.00 12/06/2017 0849   CHOLHDL 3 12/06/2017 0849   VLDL 12.0 12/06/2017 0849   LDLCALC 115 (H) 12/06/2017 0849    CBC    Component Value Date/Time   WBC 7.0 08/04/2016 0755   RBC 4.70 08/04/2016 0755   HGB 14.5 08/04/2016 0755   HCT 43.5 08/04/2016 0755   PLT 240.0 08/04/2016 0755   MCV 92.5 08/04/2016 0755   MCH 30.6 04/24/2016 1015   MCHC 33.3 08/04/2016 0755   RDW 14.1 08/04/2016 0755   LYMPHSABS 1.4 08/04/2016 0755   MONOABS 0.7 08/04/2016 0755   EOSABS 0.3 08/04/2016 0755   BASOSABS 0.0 08/04/2016 0755    Hgb A1C Lab Results  Component Value Date   HGBA1C 5.9 11/02/2017      Assessment and Plan:   Medicare Annual Wellness Visit:  Diet: He endorses a healthy diet, home cooked meals (vegetables, fruit, whole grains, lean protein. Little sugar, some take out food.  Physical activity: Active, walking 30 minutes daily Depression/mood screen: Negative Hearing: Not intact to whispered voice Visual acuity: Grossly normal, performs annual eye exam  ADLs: Capable Fall risk: None Home safety: Good Cognitive evaluation: Intact to orientation, naming,  recall and repetition EOL planning: Adv directives not completed, packet provided.   Preventative Medicine: Immunizations UTD, PSA pending. No longer to complete colonoscopy due to age, recently evaluated by GI. Recommended continued exercise, healthy diet. Exam unremarkable. Labs pending. All recommendations provided at end of visit.  Next appointment: Follow up in 1 year for [place].

## 2019-05-14 ENCOUNTER — Ambulatory Visit (INDEPENDENT_AMBULATORY_CARE_PROVIDER_SITE_OTHER): Payer: Medicare Other | Admitting: Primary Care

## 2019-05-14 ENCOUNTER — Ambulatory Visit (INDEPENDENT_AMBULATORY_CARE_PROVIDER_SITE_OTHER)
Admission: RE | Admit: 2019-05-14 | Discharge: 2019-05-14 | Disposition: A | Discharge status: Home / Self Care | Payer: Medicare Other | Source: Ambulatory Visit | Attending: Primary Care | Admitting: Primary Care

## 2019-05-14 ENCOUNTER — Other Ambulatory Visit: Payer: Self-pay

## 2019-05-14 ENCOUNTER — Encounter: Payer: Self-pay | Admitting: Primary Care

## 2019-05-14 VITALS — BP 124/76 | HR 82 | Temp 96.6°F | Ht 66.5 in | Wt 151.5 lb

## 2019-05-14 DIAGNOSIS — M79604 Pain in right leg: Secondary | ICD-10-CM | POA: Diagnosis not present

## 2019-05-14 HISTORY — DX: Pain in right leg: M79.604

## 2019-05-14 NOTE — Patient Instructions (Signed)
Complete xray(s) prior to leaving today. I will notify you of your results once received.  Start Tylenol 500-650 mg several hours before bedtime for pain. Do this consistently for about 1-2 weeks, then as needed.  Continue regular exercise.  Please update me in a few weeks if no improvement.  It was a pleasure to see you today!

## 2019-05-14 NOTE — Progress Notes (Signed)
Subjective:    Patient ID: Bryan Avila Fonder, male    DOB: 1940-06-24, 79 y.o.   MRN: [id number]  HPI  Mr. Modena is a 79 year old male with a history of RBBB, hyperlipidemia, chronic cough who presents today with a chief complaint of lower extremity pain.  This visit occurred during the SARS-CoV-2 public health emergency.  Safety protocols were in place, including screening questions prior to the visit, additional usage of staff PPE, and extensive cleaning of exam room while observing appropriate contact time as indicated for disinfecting solutions.   His pain is located to the right lateral thigh with radiation down to right lower extremity proximal to ankle. This began about 6-7 months ago, he describes pain as "achy". He mostly notices the pain at night, will wake him from sleep at times, has to "toss and turn" in order to find a comfortable position. Several days ago he noticed the pain occur while walking, this improved once he rested. Generally he tries to walk 30 minutes daily and has done so for years.   He's never been a smoker. He denies regular back pain or hip pain, numbness/tingling to right lower extremity, left lower extremity pain, injury/trauma, overuse of extremity, loss of bowel/bladder control. He doesn't take anything OTC for symptoms, occasionally takes Tylenol without much improvement.   BP Readings from Last 3 Encounters:  05/14/19 124/76  12/19/18 120/72  07/06/18 120/74     Review of Systems  Cardiovascular: Negative for chest pain.  Musculoskeletal: Negative for back pain.       Right lower extremity pain  Skin: Negative for color change.  Neurological: Negative for weakness and numbness.       Past Medical History:  Diagnosis Date  . Cataract 03/2009   left eye  . Hyperlipidemia   . Osteoporosis    osteoarthritis     Social History   Socioeconomic History  . Marital status: Married    Spouse name: Not on file  . Number of children: 1  . Years of  education: Not on file  . Highest education level: Not on file  Occupational History  . Occupation: Truck Neurosurgeon HV/AC   Tobacco Use  . Smoking status: Never Smoker  . Smokeless tobacco: Never Used  Substance and Sexual Activity  . Alcohol use: No  . Drug use: No  . Sexual activity: Never  Other Topics Concern  . Not on file  Social History Narrative   Hobbies: Consulting civil engineer    [employer]ents [phone]   Prior Management consultant ([employer])      Full code.   Would not want Tube Feeds.   Does not have a living will or HPOA.   Social Determinants of Health   Financial Resource Strain:   . Difficulty of Paying Living Expenses: Not on file  Food Insecurity:   . Worried About Charity fundraiser in the Last Year: Not on file  . Ran Out of Food in the Last Year: Not on file  Transportation Needs:   . Lack of Transportation (Medical): Not on file  . Lack of Transportation (Non-Medical): Not on file  Physical Activity:   . Days of Exercise per Week: Not on file  . Minutes of Exercise per Session: Not on file  Stress:   . Feeling of Stress : Not on file  Social Connections:   . Frequency of Communication with Friends and Family: Not on file  . Frequency of Social Gatherings with Friends and  Family: Not on file  . Attends Religious Services: Not on file  . Active Member of Clubs or Organizations: Not on file  . Attends Archivist Meetings: Not on file  . Marital Status: Not on file  Intimate Partner Violence:   . Fear of Current or Ex-Partner: Not on file  . Emotionally Abused: Not on file  . Physically Abused: Not on file  . Sexually Abused: Not on file    Past Surgical History:  Procedure Laterality Date  . EYE SURGERY    . thumb surgery  1999   right    Family History  Problem Relation Age of Onset  . Arthritis Father   . Sleep apnea Father   . Alcohol abuse Brother   . Prostate cancer Brother   . Cancer Paternal Grandfather         prostate    Allergies  Allergen Reactions  . Demerol [Meperidine] Other (See Comments)    "I almost died from that."-Severe bradycardia    Current Outpatient Medications on File Prior to Visit  Medication Sig Dispense Refill  . acyclovir (ZOVIRAX) 400 MG tablet Take 400 mg by mouth 2 (two) times daily.  99  . ASPIRIN 81 PO Take 81 mg by mouth 3 (three) times a week.    . Multiple Vitamins-Minerals (PRESERVISION AREDS PO) Take by mouth 2 (two) times daily.    . timolol (TIMOPTIC) 0.5 % ophthalmic solution Place 1 drop into the right eye daily.      No current facility-administered medications on file prior to visit.    BP 124/76   Pulse 82   Temp (!) 96.6 F (35.9 C) (Temporal)   Ht 5' 6.5" (1.689 m)   Wt 151 lb 8 oz (68.7 kg)   SpO2 98%   BMI 24.09 kg/m    Objective:   Physical Exam  Constitutional: He appears well-nourished.  Cardiovascular:  Pulses:      Dorsalis pedis pulses are 2+ on the right side.       Posterior tibial pulses are 2+ on the right side.  Respiratory: Effort normal.  Musculoskeletal:     Right hip: No deformity. Normal range of motion. Normal strength.     Comments: 5/5 strength to bilateral lower extremities.   Skin: Skin is warm and dry.           Assessment & Plan:

## 2019-05-14 NOTE — Assessment & Plan Note (Signed)
Chronic for months, nightly now.  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 Doubt circulatory cause given lack of tobacco abuse history, also with 2+ pedal pulses.  Start with plain films of right hip, consider lumbar spine imaging if needed. Will start a regimen of Tylenol nightly for 1-2 weeks, then PRN. He will update.   Exam today normal, no alarm signs.

## 2019-09-05 ENCOUNTER — Other Ambulatory Visit: Payer: Self-pay

## 2019-09-05 ENCOUNTER — Ambulatory Visit (INDEPENDENT_AMBULATORY_CARE_PROVIDER_SITE_OTHER): Payer: Medicare Other | Admitting: Dermatology

## 2019-09-05 DIAGNOSIS — D1800 Hemangioma unspecified site: Secondary | ICD-10-CM

## 2019-09-05 DIAGNOSIS — Z85828 Personal history of other malignant neoplasm of skin: Secondary | ICD-10-CM

## 2019-09-05 DIAGNOSIS — L570 Actinic keratosis: Secondary | ICD-10-CM

## 2019-09-05 DIAGNOSIS — L821 Other seborrheic keratosis: Secondary | ICD-10-CM | POA: Diagnosis not present

## 2019-09-05 DIAGNOSIS — L720 Epidermal cyst: Secondary | ICD-10-CM

## 2019-09-05 DIAGNOSIS — D485 Neoplasm of uncertain behavior of skin: Secondary | ICD-10-CM

## 2019-09-05 DIAGNOSIS — L738 Other specified follicular disorders: Secondary | ICD-10-CM

## 2019-09-05 DIAGNOSIS — Z1283 Encounter for screening for malignant neoplasm of skin: Secondary | ICD-10-CM

## 2019-09-05 DIAGNOSIS — L578 Other skin changes due to chronic exposure to nonionizing radiation: Secondary | ICD-10-CM

## 2019-09-05 DIAGNOSIS — L814 Other melanin hyperpigmentation: Secondary | ICD-10-CM

## 2019-09-05 DIAGNOSIS — D229 Melanocytic nevi, unspecified: Secondary | ICD-10-CM

## 2019-09-05 MED ORDER — CLOBETASOL PROPIONATE 0.05 % EX SOLN: 1.0000 "application " | Freq: Two times a day (BID) | CUTANEOUS | 0 refills | Status: DC

## 2019-09-05 NOTE — Patient Instructions (Addendum)
Cryotherapy Aftercare  . Wash gently with soap and water everyday.   Marland Kitchen Apply Vaseline and Band-Aid daily until healed.  Recommend daily broad spectrum sunscreen SPF 30+ to sun-exposed areas, reapply every 2 hours as needed. Call for new or changing lesions.  Topical steroids (such as triamcinolone, fluocinolone, fluocinonide, mometasone, clobetasol, halobetasol, betamethasone, hydrocortisone) can cause thinning and lightening of the skin if they are used for too long in the same area. Your physician has selected the right strength medicine for your problem and area affected on the body. Please use your medication only as directed by your physician to prevent side effects.   Wound Care Instructions  1. Cleanse wound gently with soap and water once a day then pat dry with clean gauze. Apply a thing coat of Petrolatum (petroleum jelly, "Vaseline") over the wound (unless you have an allergy to this). We recommend that you use a new, sterile tube of Vaseline. Do not pick or remove scabs. Do not remove the yellow or white "healing tissue" from the base of the wound.  2. Cover the wound with fresh, clean, nonstick gauze and secure with paper tape. You may use Band-Aids in place of gauze and tape if the would is small enough, but would recommend trimming much of the tape off as there is often too much. Sometimes Band-Aids can irritate the skin.  3. You should call the office for your biopsy report after 1 week if you have not already been contacted.  4. If you experience any problems, such as abnormal amounts of bleeding, swelling, significant bruising, significant pain, or evidence of infection, please call the office immediately.  5. FOR ADULT SURGERY PATIENTS: If you need something for pain relief you may take 1 extra strength Tylenol (acetaminophen) AND 2 Ibuprofen (200mg  each) together every 4 hours as needed for pain. (do not take these if you are allergic to them or if you have a reason you should  not take them.) Typically, you may only need pain medication for 1 to 3 days.

## 2019-09-05 NOTE — Progress Notes (Signed)
Follow-Up Visit   Subjective  Bryan Avila is a 79 y.o. male who presents for the following: Annual Exam (TBSE hx BCC R nasal tip, L vertex scalp).  Patient here today for TBSE. He has history of BCC and AK's. Patient advises nothing new or changing that he is aware of.   The following portions of the chart were reviewed this encounter and updated as appropriate:      Review of Systems:  No other skin or systemic complaints except as noted in HPI or Assessment and Plan.  Objective  Well appearing patient in no apparent distress; mood and affect are within normal limits.  A full examination was performed including scalp, head, eyes, ears, nose, lips, neck, chest, axillae, abdomen, back, buttocks, bilateral upper extremities, bilateral lower extremities, hands, feet, fingers, toes, fingernails, and toenails. All findings within normal limits unless otherwise noted below.  Objective  Scalp at vertex: Erythematous thin papule with gritty scale.   Objective  Right Neck: 22mm firm white nodule  Objective  Left vertex scalp, right nasal tip: Well healed scar with no evidence of recurrence.   Objective  Left Temporal/Frontal Scalp: 1.3cm irregularly pigmented brown patch, slightly waxy     Objective  Left Upper Eyebrow: 38mm pink slightly pearly papule  Objective  Scalp, spinal mid back: Stuck-on, waxy, tan and dark brown papules and plaques -- Discussed benign etiology and prognosis.  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 Discussed excision to remove if symptomatic and resulting scar.   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 Observe for recurrence. Call clinic for new or changing lesions.  Recommend regular skin exams, daily broad-spectrum spf 30+ sunscreen use, and photoprotection.      Neoplasm of uncertain behavior of skin Left Temporal/Frontal Scalp  Skin / nail biopsy Type of biopsy: tangential   Informed consent: discussed and consent obtained   Patient was prepped and draped in usual sterile fashion: Area prepped with alcohol. Anesthesia: the lesion was anesthetized in a standard fashion   Anesthetic:  1% lidocaine w/ epinephrine 1-100,000 buffered w/ 8.4% NaHCO3 Instrument used: flexible razor blade   Hemostasis achieved with: pressure, aluminum chloride and electrodesiccation   Outcome: patient tolerated procedure well   Post-procedure details: wound care instructions given   Post-procedure details comment:  Ointment and small bandage applied  Specimen 1 - Surgical pathology Differential Diagnosis: Pigmented SK r/o Atypia Check Margins: No 1.3cm irregularly pigmented brown patch  Sebaceous hyperplasia Left Upper Eyebrow  Vs Nevus vs BCC Recheck on follow up.   Seborrheic keratosis Scalp, spinal mid back  Benign, observe.  Large plaque SK L parietal scalp is itchy but too large to effectively remove, will start topical steroid solution to use prn itch.  Start clobetasol solution 1-2 times daily to AA scalp PRN itch. Avoid F/G/A.  clobetasol (TEMOVATE) 0.05 % external solution - Scalp, spinal mid back   Lentigines - Scattered tan  macules - Discussed due to sun exposure - Benign, observe - Call for any changes  Seborrheic Keratoses - Stuck-on, waxy, tan-brown papules and plaques  - Discussed benign etiology and prognosis. - Observe - Call for any changes  Melanocytic  Nevi - Tan-brown and/or pink-flesh-colored symmetric macules and papules - Benign appearing on exam today - Observation - Call clinic for new or changing moles - Recommend daily use of broad spectrum spf 30+ sunscreen to sun-exposed areas.   Hemangiomas - Red papules - Discussed benign nature - Observe - Call for any changes  Actinic Damage - diffuse scaly erythematous macules with underlying dyspigmentation - Recommend daily broad spectrum sunscreen SPF 30+ to sun-exposed areas, reapply every 2 hours as needed.  - Call for new or changing lesions.  Skin cancer screening performed today.  Return in about 3 months (around 12/05/2019) for recheck AKs/SKs scalp.  Graciella Belton, RMA, am acting as scribe for Brendolyn Patty, MD .  Documentation: I have reviewed the above documentation for accuracy and completeness, and I agree with the above.  Brendolyn Patty, MD

## 2019-09-10 ENCOUNTER — Telehealth: Payer: Self-pay

## 2019-09-10 NOTE — Telephone Encounter (Signed)
Advised pt of bx results/sh ?

## 2019-09-10 NOTE — Telephone Encounter (Signed)
-----   Message from Brendolyn Patty, MD sent at 09/07/2019 12:51 PM EDT ----- Skin , left temporal/frontal scalp PIGMENTED SEBORRHEIC KERATOSIS  Benign

## 2019-11-23 ENCOUNTER — Other Ambulatory Visit: Payer: Self-pay | Admitting: Primary Care

## 2019-11-23 DIAGNOSIS — E7800 Pure hypercholesterolemia, unspecified: Secondary | ICD-10-CM

## 2019-11-23 DIAGNOSIS — Z125 Encounter for screening for malignant neoplasm of prostate: Secondary | ICD-10-CM

## 2019-12-21 ENCOUNTER — Other Ambulatory Visit: Payer: Self-pay

## 2019-12-21 ENCOUNTER — Other Ambulatory Visit (INDEPENDENT_AMBULATORY_CARE_PROVIDER_SITE_OTHER): Payer: Medicare Other

## 2019-12-21 DIAGNOSIS — Z125 Encounter for screening for malignant neoplasm of prostate: Secondary | ICD-10-CM | POA: Diagnosis not present

## 2019-12-21 DIAGNOSIS — E7800 Pure hypercholesterolemia, unspecified: Secondary | ICD-10-CM | POA: Diagnosis not present

## 2019-12-21 LAB — CBC
HCT: 43.2 % (ref 39.0–52.0)
Hemoglobin: 14.6 g/dL (ref 13.0–17.0)
MCHC: 33.7 g/dL (ref 30.0–36.0)
MCV: 95 fl (ref 78.0–100.0)
Platelets: 253 10*3/uL (ref 150.0–400.0)
RBC: 4.55 Mil/uL (ref 4.22–5.81)
RDW: 14.3 % (ref 11.5–15.5)
WBC: 7.6 10*3/uL (ref 4.0–10.5)

## 2019-12-21 LAB — COMPREHENSIVE METABOLIC PANEL
ALT: 10 U/L (ref 0–53)
AST: 14 U/L (ref 0–37)
Albumin: 4.1 g/dL (ref 3.5–5.2)
Alkaline Phosphatase: 70 U/L (ref 39–117)
BUN: 17 mg/dL (ref 6–23)
CO2: 30 mEq/L (ref 19–32)
Calcium: 9.5 mg/dL (ref 8.4–10.5)
Chloride: 102 mEq/L (ref 96–112)
Creatinine, Ser: 0.89 mg/dL (ref 0.40–1.50)
GFR: 82.47 mL/min (ref >60.00)
Glucose, Bld: 96 mg/dL (ref 70–99)
Potassium: 4.3 mEq/L (ref 3.5–5.1)
Sodium: 139 mEq/L (ref 135–145)
Total Bilirubin: 0.9 mg/dL (ref 0.2–1.2)
Total Protein: 6.5 g/dL (ref 6.0–8.3)

## 2019-12-21 LAB — LIPID PANEL
Cholesterol: 189 mg/dL (ref 0–200)
HDL: 78.8 mg/dL (ref >39.00)
LDL Cholesterol: 101 mg/dL — ABNORMAL HIGH (ref 0–99)
NonHDL: 110.45
Total CHOL/HDL Ratio: 2
Triglycerides: 49 mg/dL (ref 0.0–149.0)
VLDL: 9.8 mg/dL (ref 0.0–40.0)

## 2019-12-21 LAB — PSA, MEDICARE: PSA: 0.75 ng/ml (ref 0.10–4.00)

## 2019-12-24 ENCOUNTER — Ambulatory Visit: Payer: Medicare Other

## 2019-12-25 ENCOUNTER — Other Ambulatory Visit: Payer: Self-pay

## 2019-12-25 ENCOUNTER — Telehealth: Payer: Self-pay

## 2019-12-25 ENCOUNTER — Ambulatory Visit: Payer: Medicare Other

## 2019-12-25 NOTE — Telephone Encounter (Signed)
Patient requested that I cancel his medicare visit today because he is out of town. Patient wants to complete this at his physical on 12/28/2019.

## 2019-12-26 ENCOUNTER — Ambulatory Visit: Payer: Medicare Other

## 2019-12-28 ENCOUNTER — Ambulatory Visit (INDEPENDENT_AMBULATORY_CARE_PROVIDER_SITE_OTHER): Payer: Medicare Other | Admitting: Primary Care

## 2019-12-28 ENCOUNTER — Encounter: Payer: Self-pay | Admitting: Primary Care

## 2019-12-28 ENCOUNTER — Other Ambulatory Visit: Payer: Self-pay

## 2019-12-28 ENCOUNTER — Encounter: Payer: Medicare Other | Admitting: Primary Care

## 2019-12-28 DIAGNOSIS — M79604 Pain in right leg: Secondary | ICD-10-CM | POA: Diagnosis not present

## 2019-12-28 DIAGNOSIS — E7800 Pure hypercholesterolemia, unspecified: Secondary | ICD-10-CM

## 2019-12-28 DIAGNOSIS — B009 Herpesviral infection, unspecified: Secondary | ICD-10-CM

## 2019-12-28 NOTE — Assessment & Plan Note (Signed)
Following with ophthalmology, compliant to acyclovir BID and eye drops. Continue same.

## 2019-12-28 NOTE — Assessment & Plan Note (Signed)
LDL of 101 with ASCVD risk score of 23%, discussed this with patient and his wife. He kindly declines statin therapy, would like to work on his diet.   Continue to monitor.

## 2019-12-28 NOTE — Assessment & Plan Note (Signed)
Chronic and continued. Following with orthopedics who xrayed patient. Treated with prednisone temporarily. Will get involved with physical therapy. May also get an MRI.

## 2019-12-28 NOTE — Patient Instructions (Signed)
It's important to improve your diet by reducing consumption of fast food, fried food, processed snack foods, sugary drinks. Increase consumption of fresh vegetables and fruits, whole grains, water.  Ensure you are drinking 64 ounces of water daily.  Start exercising. You should be getting 150 minutes of exercise weekly.  You need a Medicare Wellness Visit with our nurse, please have this scheduled for soon.  It was a pleasure to see you today!

## 2019-12-28 NOTE — Progress Notes (Signed)
Subjective:    Patient ID: Bryan Avila, male    DOB: April 25, 1941, 79 y.o.   MRN: [id number]  HPI  This visit occurred during the SARS-CoV-2 public health emergency.  Safety protocols were in place, including screening questions prior to the visit, additional usage of staff PPE, and extensive cleaning of exam room while observing appropriate contact time as indicated for disinfecting solutions.   Bryan Avila is a 79 year old male who presents today for [place] Part 2. He will meet with our health advisor soon.   Immunizations: -Tetanus: Completed in 2016 -Influenza: Due this season  -Shingles: Completed Zostavax in 2011 -Pneumonia: Completed series -Covid-19: Completed series  Colonoscopy: Completed in 2012, no need to repeat given age. PSA: 0.75 in 2021 Hep C Screen: Negative  BP Readings from Last 3 Encounters:  12/28/19 120/76  05/14/19 124/76  12/19/18 120/72   The 10-year ASCVD risk score Bryan Bussing DC Jr., Bryan al., Bryan Avila) is: 23.6%   Values used to calculate the score:     Age: 30 years     Sex: Male     Is Non-Hispanic African American: No     Diabetic: No     Tobacco smoker: No     Systolic Blood Pressure: 956 mmHg     Is BP treated: No     HDL Cholesterol: 78.8 mg/dL     Total Cholesterol: 189 mg/dL     Review of Systems  Respiratory: Negative for shortness of breath.   Cardiovascular: Negative for chest pain.  Gastrointestinal: Negative for constipation and diarrhea.  Neurological: Negative for dizziness and headaches.  Psychiatric/Behavioral: The patient is not nervous/anxious.        Past Medical History:  Diagnosis Date  . Cataract 03/2009   left eye  . Hyperlipidemia   . Osteoporosis    osteoarthritis     Social History   Socioeconomic History  . Marital status: Married    Spouse name: Not on file  . Number of children: 1  . Years of education: Not on file  . Highest education level: Not on file  Occupational History  . Occupation: Truck  Neurosurgeon HV/AC   Tobacco Use  . Smoking status: Never Smoker  . Smokeless tobacco: Never Used  Vaping Use  . Vaping Use: Never used  Substance and Sexual Activity  . Alcohol use: No  . Drug use: No  . Sexual activity: Never  Other Topics Concern  . 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   Would not want Tube Feeds.   Does not have a living will or HPOA.   Social Determinants of Health   Financial Resource Strain:   . Difficulty of Paying Living Expenses: Not on file  Food Insecurity:   . Worried About Charity fundraiser in the Last Year: Not on file  . Ran Out of Food in the Last Year: Not on file  Transportation Needs:   . Lack of Transportation (Medical): Not on file  . Lack of Transportation (Non-Medical): Not on file  Physical Activity:   . Days of Exercise per Week: Not on file  . Minutes of Exercise per Session: Not on file  Stress:   . Feeling of Stress : Not on file  Social Connections:   . Frequency of Communication with Friends and Family: Not on file  .  Frequency of Social Gatherings with Friends and Family: Not on file  . Attends Religious Services: Not on file  . Active Member of Clubs or Organizations: Not on file  . Attends Archivist Meetings: Not on file  . Marital Status: Not on file  Intimate Partner Violence:   . Fear of Current or Ex-Partner: Not on file  . Emotionally Abused: Not on file  . Physically Abused: Not on file  . Sexually Abused: Not on file    Past Surgical History:  Procedure Laterality Date  . EYE SURGERY    . thumb surgery  1999   right    Family History  Problem Relation Age of Onset  . Arthritis Father   . Sleep apnea Father   . Alcohol abuse Brother   . Prostate cancer Brother   . Cancer Paternal Grandfather        prostate    Allergies  Allergen Reactions  . Demerol  [Meperidine] Other (See Comments)    "I almost died from that."-Severe bradycardia    Current Outpatient Medications on File Prior to Visit  Medication Sig Dispense Refill  . acyclovir (ZOVIRAX) 400 MG tablet Take 400 mg by mouth 2 (two) times daily.  99  . clobetasol (TEMOVATE) 0.05 % external solution Apply 1 application topically 2 (two) times daily. As needed for itch to affected areas scalp. Avoid face, groin, axilla. 50 mL 0  . Multiple Vitamins-Minerals (PRESERVISION AREDS PO) Take by mouth 2 (two) times daily.    . timolol (TIMOPTIC) 0.5 % ophthalmic solution Place 1 drop into the right eye daily.      No current facility-administered medications on file prior to visit.    BP 120/76   Pulse 80   Temp (!) 96 F (35.6 C) (Temporal)   Ht 5' 6.5" (1.689 m)   Wt 145 lb 12 oz (66.1 kg)   SpO2 98%   BMI 23.17 kg/m    Objective:   Physical Exam HENT:     Right Ear: Tympanic membrane and ear canal normal.     Left Ear: Tympanic membrane and ear canal normal.  Cardiovascular:     Rate and Rhythm: Normal rate and regular rhythm.  Pulmonary:     Effort: Pulmonary effort is normal.     Breath sounds: Normal breath sounds.  Abdominal:     General: Abdomen is flat. Bowel sounds are normal.     Palpations: Abdomen is soft.  Musculoskeletal:     Cervical back: Neck supple.  Skin:    General: Skin is warm and dry.  Neurological:     Mental Status: He is alert and oriented to person, place, and time.  Psychiatric:        Mood and Affect: Mood normal.            Assessment & Plan:

## 2020-01-02 ENCOUNTER — Encounter: Payer: Medicare Other | Admitting: Dermatology

## 2020-01-02 ENCOUNTER — Encounter: Payer: Medicare Other | Admitting: Primary Care

## 2020-01-03 ENCOUNTER — Encounter: Payer: Self-pay | Admitting: Dermatology

## 2020-01-16 ENCOUNTER — Ambulatory Visit (INDEPENDENT_AMBULATORY_CARE_PROVIDER_SITE_OTHER): Payer: Medicare Other | Admitting: Dermatology

## 2020-01-16 ENCOUNTER — Other Ambulatory Visit: Payer: Self-pay

## 2020-01-16 DIAGNOSIS — L821 Other seborrheic keratosis: Secondary | ICD-10-CM

## 2020-01-16 DIAGNOSIS — D2371 Other benign neoplasm of skin of right lower limb, including hip: Secondary | ICD-10-CM

## 2020-01-16 DIAGNOSIS — L820 Inflamed seborrheic keratosis: Secondary | ICD-10-CM

## 2020-01-16 DIAGNOSIS — D229 Melanocytic nevi, unspecified: Secondary | ICD-10-CM

## 2020-01-16 DIAGNOSIS — Z85828 Personal history of other malignant neoplasm of skin: Secondary | ICD-10-CM

## 2020-01-16 DIAGNOSIS — L814 Other melanin hyperpigmentation: Secondary | ICD-10-CM

## 2020-01-16 DIAGNOSIS — L570 Actinic keratosis: Secondary | ICD-10-CM

## 2020-01-16 DIAGNOSIS — Z1283 Encounter for screening for malignant neoplasm of skin: Secondary | ICD-10-CM

## 2020-01-16 DIAGNOSIS — L578 Other skin changes due to chronic exposure to nonionizing radiation: Secondary | ICD-10-CM

## 2020-01-16 DIAGNOSIS — D1800 Hemangioma unspecified site: Secondary | ICD-10-CM

## 2020-01-16 DIAGNOSIS — D239 Other benign neoplasm of skin, unspecified: Secondary | ICD-10-CM

## 2020-01-16 NOTE — Progress Notes (Signed)
Follow-Up Visit   Subjective  Bryan Avila is a 79 y.o. male who presents for the following: FBSE.  Patient here for full body skin exam and skin cancer screening.  He has a history of BCC, not aware of anything new or changing.   Patient accompanied by wife.   The following portions of the chart were reviewed this encounter and updated as appropriate:  Tobacco  Allergies  Meds  Problems  Med Hx  Surg Hx  Fam Hx      Review of Systems:  No other skin or systemic complaints except as noted in HPI or Assessment and Plan.  Objective  Well appearing patient in no apparent distress; mood and affect are within normal limits.  A full examination was performed including scalp, head, eyes, ears, nose, lips, neck, chest, axillae, abdomen, back, buttocks, bilateral upper extremities, bilateral lower extremities, hands, feet, fingers, toes, fingernails, and toenails. All findings within normal limits unless otherwise noted below.  Objective  Right Thigh - Anterior, Scalp: Erythematous thin papules/macules with gritty scale.  Within porokeratosis at right lateral thigh.  Objective  left and right parietal scalp: Erythematous keratotic or waxy stuck-on papule or plaque.   Objective  Right Superior Lateral Thigh: 0.5cm pink papule with string of pearls pattern under dermoscopy   Assessment & Plan  AK (actinic keratosis) (2) Right Thigh - Anterior; Scalp  Scalp not treated with LN2 today, will treat with PDT.   Destruction of lesion - Right Thigh - Anterior Complexity: simple   Destruction method: cryotherapy   Informed consent: discussed and consent obtained   Lesion destroyed using liquid nitrogen: Yes   Cryotherapy cycles:  2 Outcome: patient tolerated procedure well with no complications   Post-procedure details: wound care instructions given    Inflamed seborrheic keratosis left and right parietal scalp  Will plan LN2 with curettage after numbing on follow  up.  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  Observation.  Call clinic for new or changing moles.  Recommend daily use of broad spectrum spf 30+ sunscreen to sun-exposed areas.     Lentigines - Scattered tan macules - Discussed due to sun exposure - Benign, observe - Call for any changes  Seborrheic Keratoses - Stuck-on, waxy, tan-brown papules and plaques  - Discussed benign etiology and prognosis. - Observe - Call for any changes  Melanocytic Nevi - Tan-brown and/or pink-flesh-colored symmetric macules and papules - Benign appearing on exam today - Observation - Call clinic for new or changing moles - Recommend daily use of broad spectrum spf 30+ sunscreen to sun-exposed areas.   Hemangiomas - Red papules - Discussed benign nature - Observe - Call for any changes  Actinic Damage - diffuse scaly erythematous macules with underlying dyspigmentation - Recommend daily broad spectrum sunscreen SPF 30+ to sun-exposed areas, reapply every 2 hours as needed.  - Call for new or changing lesions.  Skin cancer screening performed today.  History of Basal Cell Carcinoma of the Skin of the left vertex scalp in February 2020 and right nasal tip that is post Mohs in January 2021 - No evidence of recurrence today - Recommend regular full body skin exams - Recommend daily broad spectrum sunscreen SPF 30+ to sun-exposed areas, reapply every 2 hours as needed.  - Call if any new or changing lesions are noted between office visits  Return in 12 days (on 01/28/2020) for PDT to scalp, AK follow up 3 weeks later with Dr. Laurence Ferrari.  I,Jackie  Minette Brine, RMA, am acting as scribe for Forest Gleason, MD .   Documentation: I have reviewed the above documentation for accuracy and completeness, and I agree with the above.  Forest Gleason, MD

## 2020-01-16 NOTE — Patient Instructions (Addendum)
Cryotherapy Aftercare  . Wash gently with soap and water everyday.   Marland Kitchen Apply Vaseline and Band-Aid daily until healed.  Prior to procedure, discussed risks of blister formation, small wound, skin dyspigmentation, or rare scar following cryotherapy.   Levulan/PDT Treatment Common Side Effects  - Burning/stinging, which may be severe and last up to 24-72 hours after your treatment  - Redness, swelling and/or peeling which may last up to 4 weeks  - Scaling/crusting which may last up to 2 weeks  - Sun sensitivity (you MUST avoid sun exposure for 48-72 hours after treatment)

## 2020-01-28 ENCOUNTER — Other Ambulatory Visit: Payer: Self-pay

## 2020-01-28 ENCOUNTER — Ambulatory Visit (INDEPENDENT_AMBULATORY_CARE_PROVIDER_SITE_OTHER): Payer: Medicare Other

## 2020-01-28 DIAGNOSIS — L570 Actinic keratosis: Secondary | ICD-10-CM | POA: Diagnosis not present

## 2020-01-28 MED ORDER — AMINOLEVULINIC ACID HCL 20 % EX SOLR
1.0000 "application " | Freq: Once | CUTANEOUS | Status: AC
  Administered 2020-01-28: 354 mg via TOPICAL

## 2020-01-28 NOTE — Progress Notes (Signed)
Patient completed PDT therapy today.  1. AK (actinic keratosis) Scalp  Photodynamic therapy - Scalp Procedure discussed: discussed risks, benefits, side effects. and alternatives   Prep: site scrubbed/prepped with acetone   Location:  Scalp Number of lesions:  Multiple Type of treatment:  Blue light Aminolevulinic Acid (see MAR for details): Levulan Number of Levulan sticks used:  1 Incubation time (minutes):  120 Number of minutes under lamp:  16 Number of seconds under lamp:  40 Cooling:  Floor fan Outcome: patient tolerated procedure well with no complications   Post-procedure details: sunscreen applied    Aminolevulinic Acid HCl 20 % SOLR 354 mg - Scalp    

## 2020-01-28 NOTE — Patient Instructions (Signed)

## 2020-02-04 ENCOUNTER — Encounter: Payer: Self-pay | Admitting: Dermatology

## 2020-02-26 ENCOUNTER — Other Ambulatory Visit: Payer: Self-pay

## 2020-02-26 ENCOUNTER — Ambulatory Visit (INDEPENDENT_AMBULATORY_CARE_PROVIDER_SITE_OTHER): Payer: Medicare Other | Admitting: Dermatology

## 2020-02-26 DIAGNOSIS — L820 Inflamed seborrheic keratosis: Secondary | ICD-10-CM

## 2020-02-26 DIAGNOSIS — L570 Actinic keratosis: Secondary | ICD-10-CM

## 2020-02-26 DIAGNOSIS — D485 Neoplasm of uncertain behavior of skin: Secondary | ICD-10-CM

## 2020-02-26 NOTE — Progress Notes (Signed)
   Follow-Up Visit   Subjective  Bryan Avila is a 79 y.o. male who presents for the following: Follow-up.  Patient here today for AK of the scalp follow up. He did PDT to the scalp 01/28/20 and advised he did well, had some tenderness following procedure.   Patient also has an ISK at parietal scalp that we may treat with curettage.   The following portions of the chart were reviewed this encounter and updated as appropriate:  Tobacco  Allergies  Meds  Problems  Med Hx  Surg Hx  Fam Hx      Review of Systems:  No other skin or systemic complaints except as noted in HPI or Assessment and Plan.  Objective  Well appearing patient in no apparent distress; mood and affect are within normal limits.  A focused examination was performed including face, scalp. Relevant physical exam findings are noted in the Assessment and Plan.  Objective  Mid frontal scalp x 2 (2): Erythematous thin papules/macules with gritty scale.   Objective  L parietal scalp: Erythematous keratotic or waxy stuck-on papule or plaque.   Objective  L parietal scalp: > 5.0cm waxy papillated plaque    Assessment & Plan  AK (actinic keratosis) (2) Mid frontal scalp x 2  Prior to procedure, discussed risks of blister formation, small wound, skin dyspigmentation, or rare scar following cryotherapy.    Destruction of lesion - Mid frontal scalp x 2 Complexity: simple   Destruction method: cryotherapy   Informed consent: discussed and consent obtained   Lesion destroyed using liquid nitrogen: Yes   Cryotherapy cycles:  2 Outcome: patient tolerated procedure well with no complications   Post-procedure details: wound care instructions given    Inflamed seborrheic keratosis L parietal scalp  Prior to procedure, discussed risks of scar, bleeding, infection, skin dyspigmentation  Destruction of lesion - L parietal scalp Complexity: simple   Destruction method: electrodesiccation and curettage     Destruction method comment:  Curettage only Informed consent: discussed and consent obtained   Hemostasis achieved with:  aluminum chloride and electrodesiccation Outcome: patient tolerated procedure well with no complications   Post-procedure details: wound care instructions given    Neoplasm of uncertain behavior of skin L parietal scalp  Shave biopsy  Informed consent: discussed and consent obtained   Timeout: patient name, date of birth, surgical site, and procedure verified   Patient was prepped and draped in usual sterile fashion: area prepped with isopropyl alcohol. Anesthesia: the lesion was anesthetized in a standard fashion   Anesthetic:  1% lidocaine w/ epinephrine 1-100,000 buffered w/ 8.4% NaHCO3 Hemostasis achieved with: aluminum chloride   Outcome: patient tolerated procedure well   Post-procedure details: wound care instructions given   Additional details:  Mupirocin and a bandage applied  Specimen 1 - Surgical pathology Differential Diagnosis: r/o ISK vs Other Check Margins: No > 5.0cm waxy papillated plaque  Return in about 6 weeks (around 04/08/2020) for AK follow up.  Graciella Belton, RMA, am acting as scribe for Forest Gleason, MD .  Documentation: I have reviewed the above documentation for accuracy and completeness, and I agree with the above.  Forest Gleason, MD

## 2020-02-26 NOTE — Patient Instructions (Addendum)
Cryotherapy Aftercare  . Wash gently with soap and water everyday.   . Apply Vaseline and Band-Aid daily until healed.  Prior to procedure, discussed risks of blister formation, small wound, skin dyspigmentation, or rare scar following cryotherapy.    Wound Care Instructions  1. Cleanse wound gently with soap and water once a day then pat dry with clean gauze. Apply a thing coat of Petrolatum (petroleum jelly, "Vaseline") over the wound (unless you have an allergy to this). We recommend that you use a new, sterile tube of Vaseline. Do not pick or remove scabs. Do not remove the yellow or white "healing tissue" from the base of the wound.  2. Cover the wound with fresh, clean, nonstick gauze and secure with paper tape. You may use Band-Aids in place of gauze and tape if the would is small enough, but would recommend trimming much of the tape off as there is often too much. Sometimes Band-Aids can irritate the skin.  3. You should call the office for your biopsy report after 1 week if you have not already been contacted.  4. If you experience any problems, such as abnormal amounts of bleeding, swelling, significant bruising, significant pain, or evidence of infection, please call the office immediately.  5. FOR ADULT SURGERY PATIENTS: If you need something for pain relief you may take 1 extra strength Tylenol (acetaminophen) AND 2 Ibuprofen (200mg each) together every 4 hours as needed for pain. (do not take these if you are allergic to them or if you have a reason you should not take them.) Typically, you may only need pain medication for 1 to 3 days.     

## 2020-02-29 NOTE — Progress Notes (Signed)
Skin , left parietal scalp SEBORRHEIC KERATOSIS, IRRITATED  This is a benign growth or "wisdom spot". No additional treatment is needed.

## 2020-03-05 ENCOUNTER — Encounter: Payer: Self-pay | Admitting: Dermatology

## 2020-04-09 ENCOUNTER — Ambulatory Visit: Payer: Medicare Other | Admitting: Dermatology

## 2020-04-24 ENCOUNTER — Other Ambulatory Visit: Payer: Self-pay

## 2020-04-24 ENCOUNTER — Ambulatory Visit (INDEPENDENT_AMBULATORY_CARE_PROVIDER_SITE_OTHER): Payer: Medicare Other | Admitting: Dermatology

## 2020-04-24 DIAGNOSIS — L570 Actinic keratosis: Secondary | ICD-10-CM | POA: Diagnosis not present

## 2020-04-24 DIAGNOSIS — L820 Inflamed seborrheic keratosis: Secondary | ICD-10-CM | POA: Diagnosis not present

## 2020-04-24 NOTE — Progress Notes (Signed)
   Follow-Up Visit   Subjective  Bryan Avila is a 79 y.o. male who presents for the following: Follow-up (6 wk AK f/u at mid frontal scalp x 2). He also has some irritated spots at the scalp.  The following portions of the chart were reviewed this encounter and updated as appropriate:  Tobacco  Allergies  Meds  Problems  Med Hx  Surg Hx  Fam Hx      Review of Systems: No other skin or systemic complaints except as noted in HPI or Assessment and Plan.   Objective  Well appearing patient in no apparent distress; mood and affect are within normal limits.  A focused examination was performed including scalp. Relevant physical exam findings are noted in the Assessment and Plan.  Objective  left frontal scalp x 1: Erythematous thin papules/macules with gritty scale.   Objective  light parietal scalp x 1, right parietal scalp x 1: Erythematous keratotic or waxy stuck-on papule or plaque.   Assessment & Plan  AK (actinic keratosis) left frontal scalp x 1  Hypertrophic.   Prior to procedure, discussed risks of blister formation, small wound, skin dyspigmentation, or rare scar following cryotherapy.    Destruction of lesion - left frontal scalp x 1  Destruction method: cryotherapy   Informed consent: discussed and consent obtained   Lesion destroyed using liquid nitrogen: Yes   Outcome: patient tolerated procedure well with no complications   Post-procedure details: wound care instructions given    Inflamed seborrheic keratosis light parietal scalp x 1, right parietal scalp x 1  Plan removal with curettage at f/u. Deferred today due to holidays.  Return in about 2 months (around 06/25/2020) for ISK lef.   I, Harriett Sine, CMA, am acting as scribe for Forest Gleason, MD.  Documentation: I have reviewed the above documentation for accuracy and completeness, and I agree with the above.  Forest Gleason, MD

## 2020-04-30 ENCOUNTER — Encounter: Payer: Self-pay | Admitting: Dermatology

## 2020-06-05 ENCOUNTER — Telehealth: Payer: Self-pay | Admitting: Primary Care

## 2020-06-05 NOTE — Telephone Encounter (Signed)
LVM for pt to rtn my call to schedule awv with nha. 

## 2020-06-17 ENCOUNTER — Ambulatory Visit: Payer: Medicare Other | Admitting: Dermatology

## 2020-07-02 ENCOUNTER — Encounter: Payer: Self-pay | Admitting: Dermatology

## 2020-07-02 ENCOUNTER — Ambulatory Visit (INDEPENDENT_AMBULATORY_CARE_PROVIDER_SITE_OTHER): Payer: Medicare Other | Admitting: Dermatology

## 2020-07-02 ENCOUNTER — Other Ambulatory Visit: Payer: Self-pay

## 2020-07-02 DIAGNOSIS — L820 Inflamed seborrheic keratosis: Secondary | ICD-10-CM

## 2020-07-02 NOTE — Patient Instructions (Signed)

## 2020-07-02 NOTE — Progress Notes (Signed)
   Follow-Up Visit   Subjective  Bryan Avila is a 80 y.o. male who presents for the following: Follow-up (Patient here today for ISK follow up at the left scalp. A portion was treated previously with curettage, tolerated well. The remainder is still symptomatic.)  Patient accompanied by wife.  The following portions of the chart were reviewed this encounter and updated as appropriate:   Tobacco  Allergies  Meds  Problems  Med Hx  Surg Hx  Fam Hx      Review of Systems:  No other skin or systemic complaints except as noted in HPI or Assessment and Plan.  Objective  Well appearing patient in no apparent distress; mood and affect are within normal limits.  A focused examination was performed including scalp. Relevant physical exam findings are noted in the Assessment and Plan.  Objective  L parietal scalp: Waxy stuck on large erythematous plaque approximately 7.0 x 8.0cm   Assessment & Plan  Inflamed seborrheic keratosis L parietal scalp  Destruction of lesion - L parietal scalp  Destruction method comment:  Curettage only Informed consent: discussed and consent obtained   Timeout:  patient name, date of birth, surgical site, and procedure verified Patient was prepped and draped in usual sterile fashion: area prepped with isopropyl alcohol. Anesthesia: the lesion was anesthetized in a standard fashion   Anesthetic:  1% lidocaine w/ epinephrine 1-100,000 buffered w/ 8.4% NaHCO3 Hemostasis achieved with:  aluminum chloride Outcome: patient tolerated procedure well with no complications   Post-procedure details: wound care instructions given   Additional details:  Mupirocin and a pressure dressing applied  Return in about 6 weeks (around 08/13/2020) for ISK.  Graciella Belton, RMA, am acting as scribe for Forest Gleason, MD .  Documentation: I have reviewed the above documentation for accuracy and completeness, and I agree with the above.  Forest Gleason, MD

## 2020-08-27 ENCOUNTER — Ambulatory Visit (INDEPENDENT_AMBULATORY_CARE_PROVIDER_SITE_OTHER): Payer: Medicare Other | Admitting: Dermatology

## 2020-08-27 ENCOUNTER — Other Ambulatory Visit: Payer: Self-pay

## 2020-08-27 DIAGNOSIS — L820 Inflamed seborrheic keratosis: Secondary | ICD-10-CM

## 2020-08-27 NOTE — Patient Instructions (Addendum)

## 2020-08-27 NOTE — Progress Notes (Signed)
   Follow-Up Visit   Subjective  Bryan Avila is a 80 y.o. male who presents for the following: 6 week follow up (Patient here today for follow up on isk on left parietal scalp. He has improvement but still areas that are symptomatic.  He denies other concerns at this time. ).   The following portions of the chart were reviewed this encounter and updated as appropriate:  Tobacco  Allergies  Meds  Problems  Med Hx  Surg Hx  Fam Hx      Objective  Well appearing patient in no apparent distress; mood and affect are within normal limits.  A focused examination was performed including left scalp. Relevant physical exam findings are noted in the Assessment and Plan.  Objective  left parietal scalp: Waxy stuck on large erythematous plaque approximately 7.0 x 8.0cm  Assessment & Plan  Inflamed seborrheic keratosis left parietal scalp  Destruction of lesion - left parietal scalp Complexity: extensive   Destruction method: electrodesiccation and curettage   Destruction method comment:  Curettage only Informed consent: discussed and consent obtained   Timeout:  patient name, date of birth, surgical site, and procedure verified Patient was prepped and draped in usual sterile fashion: area prepped with isopropyl alcohol. Anesthesia: the lesion was anesthetized in a standard fashion   Anesthetic:  1% lidocaine w/ epinephrine 1-100,000 buffered w/ 8.4% NaHCO3 Outcome: patient tolerated procedure well with no complications   Post-procedure details: wound care instructions given   Additional details:  Mupirocin and a pressure dressing applied  Return in about 5 weeks (around 10/01/2020).  I, Ruthell Rummage, CMA, am acting as scribe for Forest Gleason, MD.  Documentation: I have reviewed the above documentation for accuracy and completeness, and I agree with the above.  Forest Gleason, MD

## 2020-09-01 ENCOUNTER — Encounter: Payer: Self-pay | Admitting: Dermatology

## 2020-10-08 ENCOUNTER — Other Ambulatory Visit: Payer: Self-pay

## 2020-10-08 ENCOUNTER — Ambulatory Visit (INDEPENDENT_AMBULATORY_CARE_PROVIDER_SITE_OTHER): Payer: Medicare Other | Admitting: Dermatology

## 2020-10-08 DIAGNOSIS — L820 Inflamed seborrheic keratosis: Secondary | ICD-10-CM

## 2020-10-08 NOTE — Progress Notes (Signed)
   Follow-Up Visit   Subjective  Bryan Avila is a 80 y.o. male who presents for the following: ISK recheck (Of the L parietal scalp S/P curettage on previous visit. Lesion is smaller but still persistent. (Given extremely large size we are having to treat in sections).  The following portions of the chart were reviewed this encounter and updated as appropriate:   Tobacco  Allergies  Meds  Problems  Med Hx  Surg Hx  Fam Hx      Review of Systems:  No other skin or systemic complaints except as noted in HPI or Assessment and Plan.  Objective  Well appearing patient in no apparent distress; mood and affect are within normal limits.  A focused examination was performed including face, neck, chest and back and the scalp. Relevant physical exam findings are noted in the Assessment and Plan.  L parietal scalp, R scalp x 1 (2) Erythematous keratotic or waxy stuck-on papule or plaque.   Assessment & Plan  Inflamed seborrheic keratosis L parietal scalp, R scalp x 1  L parietal scalp treated with LN2 and curettage today. R scalp treated with LN2 after being numbed with Lidocaine.  Destruction of lesion - L parietal scalp, R scalp x 1 Complexity: extensive   Destruction method: electrodesiccation and curettage   Destruction method comment:  Currettage only Informed consent: discussed and consent obtained   Timeout:  patient name, date of birth, surgical site, and procedure verified Procedure prep:  Patient was prepped and draped in usual sterile fashion Prep type:  Isopropyl alcohol Anesthesia: the lesion was anesthetized in a standard fashion   Anesthetic:  1% lidocaine w/ epinephrine 1-100,000 buffered w/ 8.4% NaHCO3 Hemostasis achieved with:  pressure, aluminum chloride and electrodesiccation Outcome: patient tolerated procedure well with no complications   Post-procedure details: sterile dressing applied and wound care instructions given   Dressing type: bandage and petrolatum     Destruction of lesion - L parietal scalp, R scalp x 1 Complexity: simple   Destruction method: cryotherapy   Informed consent: discussed and consent obtained   Timeout:  patient name, date of birth, surgical site, and procedure verified Anesthesia: the lesion was anesthetized in a standard fashion   Anesthetic:  1% lidocaine w/ epinephrine 1-100,000 buffered w/ 8.4% NaHCO3 Lesion destroyed using liquid nitrogen: Yes   Region frozen until ice ball extended beyond lesion: Yes   Outcome: patient tolerated procedure well with no complications   Post-procedure details: wound care instructions given    Return in about 8 weeks (around 12/03/2020) for ISK follow up .  Luther Redo, CMA, am acting as scribe for Forest Gleason, MD .  Documentation: I have reviewed the above documentation for accuracy and completeness, and I agree with the above.  Forest Gleason, MD

## 2020-10-08 NOTE — Patient Instructions (Signed)

## 2020-10-17 ENCOUNTER — Encounter: Payer: Self-pay | Admitting: Dermatology

## 2020-12-09 ENCOUNTER — Other Ambulatory Visit: Payer: Self-pay | Admitting: Primary Care

## 2020-12-09 DIAGNOSIS — E7800 Pure hypercholesterolemia, unspecified: Secondary | ICD-10-CM

## 2020-12-09 DIAGNOSIS — Z125 Encounter for screening for malignant neoplasm of prostate: Secondary | ICD-10-CM

## 2020-12-23 ENCOUNTER — Other Ambulatory Visit: Payer: Self-pay

## 2020-12-23 ENCOUNTER — Other Ambulatory Visit (INDEPENDENT_AMBULATORY_CARE_PROVIDER_SITE_OTHER): Payer: Medicare Other

## 2020-12-23 DIAGNOSIS — E7800 Pure hypercholesterolemia, unspecified: Secondary | ICD-10-CM | POA: Diagnosis not present

## 2020-12-23 DIAGNOSIS — Z125 Encounter for screening for malignant neoplasm of prostate: Secondary | ICD-10-CM

## 2020-12-23 LAB — CBC
HCT: 43.3 % (ref 39.0–52.0)
Hemoglobin: 14.2 g/dL (ref 13.0–17.0)
MCHC: 32.7 g/dL (ref 30.0–36.0)
MCV: 94.8 fl (ref 78.0–100.0)
Platelets: 265 10*3/uL (ref 150.0–400.0)
RBC: 4.56 Mil/uL (ref 4.22–5.81)
RDW: 14.4 % (ref 11.5–15.5)
WBC: 6.2 10*3/uL (ref 4.0–10.5)

## 2020-12-23 LAB — COMPREHENSIVE METABOLIC PANEL
ALT: 11 U/L (ref 0–53)
AST: 14 U/L (ref 0–37)
Albumin: 3.8 g/dL (ref 3.5–5.2)
Alkaline Phosphatase: 72 U/L (ref 39–117)
BUN: 17 mg/dL (ref 6–23)
CO2: 30 mEq/L (ref 19–32)
Calcium: 9.7 mg/dL (ref 8.4–10.5)
Chloride: 104 mEq/L (ref 96–112)
Creatinine, Ser: 0.88 mg/dL (ref 0.40–1.50)
GFR: 81.54 mL/min (ref >60.00)
Glucose, Bld: 69 mg/dL — ABNORMAL LOW (ref 70–99)
Potassium: 4.3 mEq/L (ref 3.5–5.1)
Sodium: 141 mEq/L (ref 135–145)
Total Bilirubin: 0.6 mg/dL (ref 0.2–1.2)
Total Protein: 6.5 g/dL (ref 6.0–8.3)

## 2020-12-23 LAB — LIPID PANEL
Cholesterol: 192 mg/dL (ref 0–200)
HDL: 72.5 mg/dL (ref >39.00)
LDL Cholesterol: 100 mg/dL — ABNORMAL HIGH (ref 0–99)
NonHDL: 119.1
Total CHOL/HDL Ratio: 3
Triglycerides: 98 mg/dL (ref 0.0–149.0)
VLDL: 19.6 mg/dL (ref 0.0–40.0)

## 2020-12-23 LAB — PSA, MEDICARE: PSA: 0.75 ng/ml (ref 0.10–4.00)

## 2020-12-24 ENCOUNTER — Ambulatory Visit: Payer: Medicare Other

## 2020-12-30 ENCOUNTER — Ambulatory Visit (INDEPENDENT_AMBULATORY_CARE_PROVIDER_SITE_OTHER): Payer: Medicare Other | Admitting: Primary Care

## 2020-12-30 ENCOUNTER — Other Ambulatory Visit: Payer: Self-pay

## 2020-12-30 VITALS — BP 122/78 | HR 68 | Temp 98.7°F | Ht 66.5 in | Wt 145.0 lb

## 2020-12-30 DIAGNOSIS — E7800 Pure hypercholesterolemia, unspecified: Secondary | ICD-10-CM

## 2020-12-30 DIAGNOSIS — M1990 Unspecified osteoarthritis, unspecified site: Secondary | ICD-10-CM

## 2020-12-30 DIAGNOSIS — B009 Herpesviral infection, unspecified: Secondary | ICD-10-CM | POA: Diagnosis not present

## 2020-12-30 DIAGNOSIS — H9193 Unspecified hearing loss, bilateral: Secondary | ICD-10-CM

## 2020-12-30 DIAGNOSIS — Z0000 Encounter for general adult medical examination without abnormal findings: Secondary | ICD-10-CM

## 2020-12-30 NOTE — Progress Notes (Signed)
Subjective:    Patient ID: Bryan Avila, male    DOB: 06-16-1940, 80 y.o.   MRN: [id number]  HPI  Bryan Avila is a very pleasant 80 y.o. male who presents today for medicare wellness visit.   HPI:  Past Medical History:  Diagnosis Date   Basal cell carcinoma 07/05/2018   left vertex scalp   Basal cell carcinoma 05/17/2019   right nasal tip/Moh's   Cataract 03/2009   left eye   Hyperlipidemia    Osteoporosis    osteoarthritis    Current Outpatient Medications  Medication Sig Dispense Refill   acyclovir (ZOVIRAX) 400 MG tablet Take 400 mg by mouth 2 (two) times daily. (Patient not taking: Reported on 10/08/2020)  99   clobetasol (TEMOVATE) 0.05 % external solution Apply 1 application topically 2 (two) times daily. As needed for itch to affected areas scalp. Avoid face, groin, axilla. 50 mL 0   Multiple Vitamins-Minerals (PRESERVISION AREDS PO) Take by mouth 2 (two) times daily.     timolol (TIMOPTIC) 0.5 % ophthalmic solution Place 1 drop into the right eye daily.      No current facility-administered medications for this visit.    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   Would not want Tube Feeds.   Does not have a living will or HPOA.   Social Determinants of Health   Financial Resource Strain: Not on file  Food Insecurity: Not on file  Transportation Needs: Not on file  Physical Activity: Not on file  Stress: Not on file  Social Connections: Not on file  Intimate Partner Violence: Not on file    Hospitiliaztions: None.  Health Maintenance:    Flu: Due this season   Tetanus: 2016  Pneumovax: 2008  Prevnar: 2015  Shingles: Zostavax in 2011  Colonoscopy: 2012, declines due to age  Eye Doctor: Follows annually   Dental Exam: Semi-annually   PSA: 2022  Hep C Screening:     Providers: Alma Friendly, PCP; Dr. Laurence Ferrari, Dermatology, eye doctor, dentist    I have personally reviewed and have noted: 1. The patient's medical and social history 2. Their use of alcohol, tobacco or illicit drugs 3. Their current medications and supplements 4. The patient's functional ability including ADL's, fall risks, home safety risks and  hearing or visual impairment. 5. Diet and physical activities 6. Evidence  for depression or mood disorder  Subjective:   Review of Systems:   Constitutional: Denies fever, malaise, fatigue, headache or abrupt weight changes.  HEENT: Denies eye pain, eye redness, ear pain, ringing in the ears, wax buildup, runny nose, nasal congestion, bloody nose, or sore throat. Respiratory: Denies difficulty breathing, shortness of breath, cough or sputum production.   Cardiovascular: Denies chest pain, chest tightness, palpitations or swelling in the hands or feet.  Gastrointestinal: Denies abdominal pain, bloating, constipation, diarrhea or blood in the stool.  GU: Denies urgency, frequency, pain with urination, burning sensation, blood in urine, odor or discharge. Musculoskeletal: Denies decrease in range of motion,  difficulty with gait, muscle pain or joint pain and swelling.  Skin: Denies new redness, rashes, lesions or ulcercations.  Neurological: Denies dizziness, difficulty with memory, difficulty with speech or problems with balance and coordination. His wife is with him today who endorses a decline in memory, hearing loss. Denies repetitive questioning or and dangers to himself Psychiatric: Denies concerns for anxiety or depression.   No other specific complaints in a complete review of systems (except as listed in HPI above).  Objective:  PE:   BP 122/78   Pulse 68   Temp 98.7 F (37.1 C) (Temporal)   Ht 5' 6.5" (1.689 m)   Wt 145 lb (65.8 kg)   SpO2 98%   BMI 23.05 kg/m  Wt Readings from Last 3 Encounters:  12/30/20 145 lb (65.8 kg)  12/28/19 145 lb 12 oz (66.1 kg)  05/14/19 151 lb 8 oz (68.7 kg)    General: Appears their stated age, well developed, well nourished in NAD. Skin: Warm, dry and intact. No rashes, lesions or ulcerations noted. HEENT: Head: normal shape and size; Eyes: sclera white, no icterus, conjunctiva pink, PERRLA and EOMs intact; Ears: Tm's gray and intact, normal light reflex; Nose: mucosa pink and moist, septum midline; Throat/Mouth: Teeth present, mucosa pink and moist, no exudate, lesions or ulcerations noted.  Neck: Normal range of motion. Neck supple, trachea midline. No massses, lumps or thyromegaly present.  Cardiovascular: Normal rate and rhythm. S1,S2 noted.  No murmur, rubs or gallops noted. No JVD or BLE edema. No carotid bruits noted. Pulmonary/Chest: Normal effort and positive vesicular breath sounds. No respiratory distress. No wheezes, rales or ronchi noted.  Abdomen: Soft and nontender. Normal bowel sounds, no bruits noted. No distention or masses noted. Liver, spleen and kidneys non palpable. Musculoskeletal: Normal range of motion. No signs of joint swelling. No difficulty with gait.  Neurological: Alert and oriented. Cranial nerves II-XII intact.  Coordination normal. +DTRs bilaterally. Psychiatric: Mood and affect normal. Behavior is normal. Judgment and thought content normal.    BMET    Component Value Date/Time   NA 141 12/23/2020 0829   K 4.3 12/23/2020 0829   CL 104 12/23/2020 0829   CO2 30 12/23/2020 0829   GLUCOSE 69 (L) 12/23/2020 0829   BUN 17 12/23/2020 0829   CREATININE 0.88 12/23/2020 0829   CALCIUM 9.7 12/23/2020 0829   GFRNONAA >60 04/24/2016 1015   GFRAA >60 04/24/2016 1015    Lipid Panel     Component Value Date/Time   CHOL 192 12/23/2020 0829   TRIG 98.0 12/23/2020 0829   HDL 72.50 12/23/2020 0829   CHOLHDL 3 12/23/2020 0829   VLDL 19.6 12/23/2020 0829   LDLCALC 100 (H) 12/23/2020 0829    CBC    Component Value Date/Time   WBC 6.2 12/23/2020 0829   RBC 4.56 12/23/2020 0829  HGB 14.2 12/23/2020 0829   HCT 43.3 12/23/2020 0829   PLT 265.0 12/23/2020 0829   MCV 94.8 12/23/2020 0829   MCH 30.6 04/24/2016 1015   MCHC 32.7 12/23/2020 0829   RDW 14.4 12/23/2020 0829   LYMPHSABS 1.4 08/04/2016 0755   MONOABS 0.7 08/04/2016 0755   EOSABS 0.3 08/04/2016 0755   BASOSABS 0.0 08/04/2016 0755    Hgb A1C Lab Results  Component Value Date   HGBA1C 5.9 11/02/2017      Assessment and Plan:   Medicare Annual Wellness Visit:  Diet: He endorses a healthy diet.  Physical activity: Active, walks daily Depression/mood screen: Negative Hearing: Not intact to whispered voice Visual acuity: Grossly normal, performs annual eye exam  ADLs: Capable Fall risk: None Home safety: Good Cognitive evaluation: Intact to orientation, naming, recall and repetition EOL planning: Adv directives  Preventative Medicine: Immunizations UTD, decline Shingrix. Declines colonoscopy given age. PSA UTD. Encouraged a healthy diet and regular exercise. Exam today stable. Labs reviewed.   Next appointment: 1 year     Review of Systems  Constitutional:  Negative for unexpected weight change.  HENT:  Positive for  hearing loss. Negative for rhinorrhea.   Respiratory:  Negative for shortness of breath.   Cardiovascular:  Negative for chest pain.  Gastrointestinal:  Negative for constipation and diarrhea.  Genitourinary:  Negative for difficulty urinating.  Musculoskeletal:  Positive for arthralgias. Negative for myalgias.  Skin:  Negative for rash.  Allergic/Immunologic: Negative for environmental allergies.  Neurological:  Negative for dizziness and headaches.  Psychiatric/Behavioral:  The patient is not nervous/anxious.         Past Medical History:  Diagnosis Date   Basal cell carcinoma 07/05/2018   left vertex scalp   Basal cell carcinoma 05/17/2019   right nasal tip/Moh's   Cataract 03/2009   left eye   Hyperlipidemia    Osteoporosis    osteoarthritis    Social History   Socioeconomic History   Marital status: Married    Spouse name: Not on file   Number of children: 1   Years of education: Not on file   Highest education level: Not on file  Occupational History   Occupation: Truck Neurosurgeon [employer]   Tobacco Use   Smoking status: Never   Smokeless tobacco: Never  Vaping Use   Vaping Use: Never used  Substance and Sexual Activity   Alcohol use: No   Drug use: No   Sexual activity: Never  Other Topics Concern   Not on file  Social History Narrative   Hobbies: Consulting civil engineer    [employer]ents [id number]   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   Would not want Tube Feeds.   Does not have a living will or HPOA.   Social Determinants of Health   Financial Resource Strain: Not on file  Food Insecurity: Not on file  Transportation Needs: Not on file  Physical Activity: Not on file  Stress: Not on file  Social Connections: Not on file  Intimate Partner Violence: Not on file    Past Surgical History:  Procedure Laterality Date   EYE SURGERY     thumb surgery  1999   right    Family History  Problem Relation Age of Onset    Arthritis Father    Sleep apnea Father    Alcohol abuse Brother    Prostate cancer Brother    Cancer Paternal Grandfather        prostate  Allergies  Allergen Reactions   Demerol [Meperidine] Other (See Comments)    "I almost died from that."-Severe bradycardia    Current Outpatient Medications on File Prior to Visit  Medication Sig Dispense Refill   acyclovir (ZOVIRAX) 400 MG tablet Take 400 mg by mouth 2 (two) times daily. (Patient not taking: Reported on 10/08/2020)  99   clobetasol (TEMOVATE) 0.05 % external solution Apply 1 application topically 2 (two) times daily. As needed for itch to affected areas scalp. Avoid face, groin, axilla. 50 mL 0   Multiple Vitamins-Minerals (PRESERVISION AREDS PO) Take by mouth 2 (two) times daily.     timolol (TIMOPTIC) 0.5 % ophthalmic solution Place 1 drop into the right eye daily.      No current facility-administered medications on file prior to visit.    BP 122/78   Pulse 68   Temp 98.7 F (37.1 C) (Temporal)   Ht 5' 6.5" (1.689 m)   Wt 145 lb (65.8 kg)   SpO2 98%   BMI 23.05 kg/m  Objective:   Physical Exam HENT:     Right Ear: Tympanic membrane and ear canal normal.     Left Ear: Tympanic membrane and ear canal normal.     Nose: Nose normal.     Right Sinus: No maxillary sinus tenderness or frontal sinus tenderness.     Left Sinus: No maxillary sinus tenderness or frontal sinus tenderness.  Eyes:     Conjunctiva/sclera: Conjunctivae normal.  Neck:     Thyroid: No thyromegaly.     Vascular: No carotid bruit.  Cardiovascular:     Rate and Rhythm: Normal rate and regular rhythm.     Heart sounds: Normal heart sounds.  Pulmonary:     Effort: Pulmonary effort is normal.     Breath sounds: Normal breath sounds. No wheezing or rales.  Abdominal:     General: Bowel sounds are normal.     Palpations: Abdomen is soft.     Tenderness: There is no abdominal tenderness.  Musculoskeletal:        General: Normal range of motion.      Cervical back: Neck supple.  Skin:    General: Skin is warm and dry.  Neurological:     Mental Status: He is alert and oriented to person, place, and time.     Cranial Nerves: No cranial nerve deficit.     Deep Tendon Reflexes: Reflexes are normal and symmetric.  Psychiatric:        Mood and Affect: Mood normal.          Assessment & Plan:      This visit occurred during the SARS-CoV-2 public health emergency.  Safety protocols were in place, including screening questions prior to the visit, additional usage of staff PPE, and extensive cleaning of exam room while observing appropriate contact time as indicated for disinfecting solutions.

## 2020-12-30 NOTE — Assessment & Plan Note (Signed)
Chronic, stable, no complaints today.

## 2020-12-30 NOTE — Patient Instructions (Signed)
It was a pleasure to see you today!   

## 2020-12-30 NOTE — Assessment & Plan Note (Signed)
Stable on recent labs. Continue to monitor.  

## 2020-12-30 NOTE — Assessment & Plan Note (Signed)
Chronic, no cerumen impaction noted to bilateral ears.

## 2020-12-30 NOTE — Assessment & Plan Note (Signed)
Immunizations UTD, decline Shingrix. Declines colonoscopy given age. PSA UTD. Encouraged a healthy diet and regular exercise. Exam today stable. Labs reviewed.     I have personally reviewed and have noted: 1. The patient's medical and social history 2. Their use of alcohol, tobacco or illicit drugs 3. Their current medications and supplements 4. The patient's functional ability including ADL's, fall risks, home safety risks and  hearing or visual impairment. 5. Diet and physical activities 6. Evidence for depression or mood disorder

## 2020-12-30 NOTE — Assessment & Plan Note (Signed)
Following with ophlamology, compliant to acyclovir 400 mg BID and Timolol drops, continue same.

## 2021-01-08 ENCOUNTER — Ambulatory Visit (INDEPENDENT_AMBULATORY_CARE_PROVIDER_SITE_OTHER): Payer: Medicare Other

## 2021-01-09 DIAGNOSIS — Z23 Encounter for immunization: Secondary | ICD-10-CM | POA: Diagnosis not present

## 2021-01-22 ENCOUNTER — Ambulatory Visit: Payer: BC Managed Care – PPO | Admitting: Dermatology

## 2021-01-27 ENCOUNTER — Emergency Department
Admission: EM | Admit: 2021-01-27 | Discharge: 2021-01-27 | Disposition: A | Discharge status: Home / Self Care | Payer: Medicare Other | Attending: Emergency Medicine | Admitting: Emergency Medicine

## 2021-01-27 ENCOUNTER — Ambulatory Visit
Admission: EM | Admit: 2021-01-27 | Discharge: 2021-01-27 | Payer: Medicare Other | Attending: Emergency Medicine | Admitting: Emergency Medicine

## 2021-01-27 ENCOUNTER — Other Ambulatory Visit: Payer: Self-pay

## 2021-01-27 ENCOUNTER — Emergency Department: Payer: Medicare Other

## 2021-01-27 ENCOUNTER — Encounter: Payer: Self-pay | Admitting: Emergency Medicine

## 2021-01-27 DIAGNOSIS — R531 Weakness: Secondary | ICD-10-CM | POA: Diagnosis not present

## 2021-01-27 DIAGNOSIS — R55 Syncope and collapse: Secondary | ICD-10-CM

## 2021-01-27 DIAGNOSIS — R42 Dizziness and giddiness: Secondary | ICD-10-CM | POA: Insufficient documentation

## 2021-01-27 DIAGNOSIS — Z85828 Personal history of other malignant neoplasm of skin: Secondary | ICD-10-CM | POA: Insufficient documentation

## 2021-01-27 DIAGNOSIS — R9431 Abnormal electrocardiogram [ECG] [EKG]: Secondary | ICD-10-CM | POA: Diagnosis not present

## 2021-01-27 DIAGNOSIS — D72829 Elevated white blood cell count, unspecified: Secondary | ICD-10-CM | POA: Diagnosis not present

## 2021-01-27 DIAGNOSIS — Z20822 Contact with and (suspected) exposure to covid-19: Secondary | ICD-10-CM | POA: Insufficient documentation

## 2021-01-27 LAB — CBC
HCT: 42.7 % (ref 39.0–52.0)
Hemoglobin: 14.7 g/dL (ref 13.0–17.0)
MCH: 32.7 pg (ref 26.0–34.0)
MCHC: 34.4 g/dL (ref 30.0–36.0)
MCV: 95.1 fL (ref 80.0–100.0)
Platelets: 250 K/uL (ref 150–400)
RBC: 4.49 MIL/uL (ref 4.22–5.81)
RDW: 13.7 % (ref 11.5–15.5)
WBC: 18.6 K/uL — ABNORMAL HIGH (ref 4.0–10.5)
nRBC: 0 % (ref 0.0–0.2)

## 2021-01-27 LAB — URINALYSIS, COMPLETE (UACMP) WITH MICROSCOPIC
Bacteria, UA: NONE SEEN
Bilirubin Urine: NEGATIVE
Glucose, UA: NEGATIVE mg/dL
Ketones, ur: NEGATIVE mg/dL
Leukocytes,Ua: NEGATIVE
Nitrite: NEGATIVE
Protein, ur: NEGATIVE mg/dL
Specific Gravity, Urine: 1.015 (ref 1.005–1.030)
Squamous Epithelial / HPF: NONE SEEN (ref 0–5)
pH: 5.5 (ref 5.0–8.0)

## 2021-01-27 LAB — TROPONIN I (HIGH SENSITIVITY)
Troponin I (High Sensitivity): 4 ng/L
Troponin I (High Sensitivity): 6 ng/L

## 2021-01-27 LAB — POCT FASTING CBG KUC MANUAL ENTRY: POCT Glucose (KUC): 134 mg/dL — AB (ref 70–99)

## 2021-01-27 LAB — BASIC METABOLIC PANEL WITH GFR
Anion gap: 8 (ref 5–15)
BUN: 20 mg/dL (ref 8–23)
CO2: 28 mmol/L (ref 22–32)
Calcium: 9.3 mg/dL (ref 8.9–10.3)
Chloride: 102 mmol/L (ref 98–111)
Creatinine, Ser: 0.89 mg/dL (ref 0.61–1.24)
GFR, Estimated: >60 mL/min
Glucose, Bld: 131 mg/dL — ABNORMAL HIGH (ref 70–99)
Potassium: 3.8 mmol/L (ref 3.5–5.1)
Sodium: 138 mmol/L (ref 135–145)

## 2021-01-27 LAB — RESP PANEL BY RT-PCR (FLU A&B, COVID) ARPGX2
Influenza A by PCR: NEGATIVE
Influenza B by PCR: NEGATIVE
SARS Coronavirus 2 by RT PCR: NEGATIVE

## 2021-01-27 NOTE — ED Provider Notes (Signed)
Bryan Avila    CSN: [id number] Arrival date & time: 01/27/21  5035      History   Chief Complaint Chief Complaint  Patient presents with   Nausea   Dizziness   Weakness    HPI Bryan Avila is a 80 y.o. male.  Patient presents with episode of dizziness, weakness, nausea this morning at 5:30 AM.  His symptoms improved after rest and eating breakfast.  He reports ongoing generalized weakness.  He denies chest pain, shortness of breath, focal weakness, current dizziness, or other symptoms.  No treatments at home.  His medical history includes hyperlipidemia, basal cell carcinoma, right bundle branch block, hearing loss.  The history is provided by the patient and medical records.   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 Authorizing Provider  acyclovir (ZOVIRAX) 400 MG tablet Take 400 mg by mouth 2 (two) times daily. 07/06/15   [provider]  clobetasol (TEMOVATE) 0.05 % external solution Apply 1 application topically 2 (two) times daily. As needed for itch to affected areas  scalp. Avoid face, groin, axilla. 09/05/19   Brendolyn Patty, MD  Multiple Vitamins-Minerals (PRESERVISION AREDS PO) Take by mouth 2 (two) times daily.    [provider]  timolol (TIMOPTIC) 0.5 % ophthalmic solution Place 1 drop into the right eye daily.  06/10/16   [provider]    Family History Family History  Problem Relation Age of Onset   Arthritis Father    Sleep apnea Father    Alcohol abuse Brother    Prostate cancer Brother    Cancer Paternal Grandfather        prostate    Social History Social History   Tobacco Use   Smoking status: Never   Smokeless tobacco: Never  Vaping Use   Vaping Use: Never used  Substance Use Topics   Alcohol use: No   Drug use: No     Allergies   Demerol [meperidine]   Review of Systems Review of Systems  Constitutional:  Negative for chills and fever.  Respiratory:  Negative for cough and shortness of breath.   Cardiovascular:  Negative for chest pain and palpitations.  Gastrointestinal:  Positive for nausea. Negative for abdominal pain and vomiting.  Skin:  Negative for color change and rash.  Neurological:  Positive for dizziness and weakness. Negative for syncope and numbness.  All other systems reviewed and are negative.   Physical Exam Triage Vital Signs ED Triage Vitals  Enc Vitals Group  BP      Pulse      Resp      Temp      Temp src      SpO2      Weight      Height      Head Circumference      Peak Flow      Pain Score      Pain Loc      Pain Edu?      Excl. in [place]?    No data found.  Updated Vital Signs BP 118/70   Pulse 82   Temp 98.6 F (37 C)   Resp 16   SpO2 98%   Visual Acuity Right Eye Distance:   Left Eye Distance:   Bilateral Distance:    Right Eye Near:   Left Eye Near:    Bilateral Near:     Physical Exam Vitals and nursing note reviewed.  Constitutional:      General: He is not in acute distress.    Appearance: He is well-developed.  HENT:     Head:  Normocephalic and atraumatic.     Mouth/Throat:     Mouth: Mucous membranes are moist.  Eyes:     Conjunctiva/sclera: Conjunctivae normal.  Cardiovascular:     Rate and Rhythm: Normal rate and regular rhythm.     Heart sounds: Normal heart sounds.  Pulmonary:     Effort: Pulmonary effort is normal. No respiratory distress.     Breath sounds: Normal breath sounds.  Abdominal:     Palpations: Abdomen is soft.     Tenderness: There is no abdominal tenderness.  Musculoskeletal:     Cervical back: Neck supple.     Right lower leg: No edema.     Left lower leg: No edema.  Skin:    General: Skin is warm and dry.  Neurological:     General: No focal deficit present.     Mental Status: He is alert and oriented to person, place, and time.     Sensory: No sensory deficit.     Motor: No weakness.     Gait: Gait normal.  Psychiatric:        Mood and Affect: Mood normal.        Behavior: Behavior normal.     UC Treatments / Results  Labs (all labs ordered are listed, but only abnormal results are displayed) Labs Reviewed  POCT FASTING CBG [facility] MANUAL ENTRY - Abnormal; Notable for the following components:      Result Value   POCT Glucose ([facility]) 134 (*)    All other components within normal limits    EKG   Radiology No results found.  Procedures Procedures (including critical care time)  Medications Ordered in UC Medications - No data to display  Initial Impression / Assessment and Plan / UC Course  I have reviewed the triage vital signs and the nursing notes.  Pertinent labs & imaging results that were available during my care of the patient were reviewed by me and considered in my medical decision making (see chart for details).  Dizziness, weakness, abnormal EKG.  EKG shows sinus rhythm, rate 88, no ST elevation, bundle branch block, T wave inversion in V2, V3, V4 which is new compared to previous from 2019.  Vital signs are stable.  Patient is alert and oriented.   Sending patient to the ED for evaluation.  He and his wife decline transport by EMS.  His wife will  drive him there.  He reports he is stable for transport POV.     Final Clinical Impressions(s) / UC Diagnoses   Final diagnoses:  Abnormal EKG  Weakness  Dizziness     Discharge Instructions      Go to the emergency department for evaluation of your abnormal EKG, weakness, dizziness.     ED Prescriptions   None    PDMP not reviewed this encounter.   Sharion Balloon, NP 01/27/21 [phone]

## 2021-01-27 NOTE — ED Triage Notes (Signed)
Pt here after feeling nauseous last night and this morning, then feeling weak and dizzy when waking up this morning.

## 2021-01-27 NOTE — Discharge Instructions (Addendum)
Go to the emergency department for evaluation of your abnormal EKG, weakness, dizziness.

## 2021-01-27 NOTE — ED Notes (Signed)
Patient is resting comfortably. 

## 2021-01-27 NOTE — Discharge Instructions (Addendum)
Your examination and tests today were all reassuring.  Please monitor your symptoms and follow-up with your doctor.

## 2021-01-27 NOTE — ED Notes (Addendum)
Pt NAD, a/ox4, states has had many symptoms including dizziness, nausea. LS clear bilat, VSS

## 2021-01-27 NOTE — ED Provider Notes (Signed)
[facility] Emergency Department Provider Note  ____________________________________________  Time seen: Approximately 3:07 PM  I have reviewed the triage vital signs and the nursing notes.   HISTORY  Chief Complaint Dizziness and Nausea    HPI Bryan Avila is a 80 y.o. male with a history of hyperlipidemia, right bundle branch block who is sent to the ED due to dizziness.  Patient reports he was in his usual state of health up through yesterday.  He notes that he woke up in the middle of the night feeling nauseated and feeling like he had to go to the bathroom, which he attributes to something he ate for dinner.  This morning, after getting up and while shaving, he started to feel lightheaded.  He felt pale and clammy and very fatigued so he went and sat on his bed and within a few moments the feeling passed and he was feeling essentially back to normal.  Denies any pain before during or after this event.  No headache vision changes paresthesias or focal weakness.  No falls or trauma.  No vomiting or diarrhea.  He made appointment to be seen by his doctor 2 weeks from now.  Patient also notes that he walks 30 minutes a day and does not get chest pain or shortness of breath with this.  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 Authorizing Provider  acyclovir (ZOVIRAX) 400 MG tablet Take 400 mg by mouth 2 (two) times daily. 07/06/15   [provider]  clobetasol (TEMOVATE) 0.05 % external solution Apply 1 application topically 2 (two) times daily. As needed for itch to affected areas scalp. Avoid face, groin, axilla. 09/05/19   Brendolyn Patty, MD  Multiple Vitamins-Minerals (PRESERVISION AREDS PO) Take by mouth 2 (two) times daily.    [provider]  timolol (TIMOPTIC) 0.5 % ophthalmic solution Place 1 drop into the right eye daily.  06/10/16   [provider]     Allergies Demerol [meperidine]   Family History  Problem Relation Age of Onset   Arthritis Father    Sleep apnea Father    Alcohol abuse Brother    Prostate cancer Brother    Cancer Paternal Grandfather        prostate    Social History Social History   Tobacco Use   Smoking status: Never   Smokeless tobacco: Never  Vaping Use   Vaping Use: Never used  Substance Use Topics   Alcohol use: No   Drug use: No  Review of Systems  Constitutional:   No fever or chills.  ENT:   No sore throat. No rhinorrhea. Cardiovascular:   No chest pain or syncope. Respiratory:   No dyspnea or cough. Gastrointestinal:   Negative for abdominal pain, vomiting and diarrhea.  Musculoskeletal:   Negative for focal pain or swelling All other systems reviewed and are negative except as documented above in ROS and HPI.  ____________________________________________   PHYSICAL EXAM:  VITAL SIGNS: ED Triage Vitals  Enc Vitals Group     BP 01/27/21 1057 118/75     Pulse Rate 01/27/21 1057 81     Resp 01/27/21 1057 20     Temp 01/27/21 1057 98.6 F (37 C)     Temp Source 01/27/21 1057 Oral     SpO2  01/27/21 1057 96 %     Weight --      Height --      Head Circumference --      Peak Flow --      Pain Score 01/27/21 1110 0     Pain Loc --      Pain Edu? --      Excl. in [place]? --     Vital signs reviewed, nursing assessments reviewed.   Constitutional:   Alert and oriented. Non-toxic appearance. Eyes:   Conjunctivae are normal. EOMI. PERRL. ENT      Head:   Normocephalic and atraumatic.      Nose:   Wearing a mask.      Mouth/Throat:   Wearing a mask.      Neck:   No meningismus. Full ROM. Hematological/Lymphatic/Immunilogical:   No cervical lymphadenopathy. Cardiovascular:   RRR. Symmetric bilateral radial and DP pulses.  No murmurs. Cap refill less than 2 seconds. Respiratory:   Normal respiratory effort without tachypnea/retractions. Breath sounds are clear and equal bilaterally. No wheezes/rales/rhonchi. Gastrointestinal:   Soft and nontender. Non distended. There is no CVA tenderness.  No rebound, rigidity, or guarding. Genitourinary:   deferred Musculoskeletal:   Normal range of motion in all extremities. No joint effusions.  No lower extremity tenderness.  No edema. Neurologic:   Normal speech and language.  Motor grossly intact. No acute focal neurologic deficits are appreciated.  Skin:    Skin is warm, dry and intact. No rash noted.  No petechiae, purpura, or bullae.  ____________________________________________    LABS (pertinent positives/negatives) (all labs ordered are listed, but only abnormal results are displayed) Labs Reviewed  BASIC METABOLIC PANEL - Abnormal; Notable for the following components:      Result Value   Glucose, Bld 131 (*)    All other components within normal limits  CBC - Abnormal; Notable for the following components:   WBC 18.6 (*)    All other components within normal limits  URINALYSIS, COMPLETE (UACMP) WITH MICROSCOPIC - Abnormal; Notable for the following components:   Hgb urine dipstick SMALL (*)    All other components within  normal limits  RESP PANEL BY RT-PCR (FLU A&B, COVID) ARPGX2  TROPONIN I (HIGH SENSITIVITY)  TROPONIN I (HIGH SENSITIVITY)   ____________________________________________   EKG  Interpreted by me Sinus rhythm rate of 75, left axis, right bundle branch block.  No high-grade AV block.  Normal ST segments.  There is T wave inversion V3 and V4 which is new compared to prior EKG in 2019.  ____________________________________________    RADIOLOGY  DG Chest 2 View  Result Date: 01/27/2021 CLINICAL DATA:  Dizziness EXAM: CHEST - 2 VIEW  COMPARISON:  07/06/2018 FINDINGS: The heart size and mediastinal contours are within normal limits. No focal airspace consolidation, pleural effusion, or pneumothorax. The visualized skeletal structures are unremarkable. IMPRESSION: No active cardiopulmonary disease. Electronically Signed   By: Davina Poke D.O.   On: 01/27/2021 11:56    ____________________________________________   PROCEDURES Procedures  ____________________________________________  DIFFERENTIAL DIAGNOSIS   Vasovagal episode, dehydration, gastroenteritis, electrolyte abnormality, non-STEMI  CLINICAL IMPRESSION / ASSESSMENT AND PLAN / ED COURSE  Medications ordered in the ED: Medications - No data to display  Pertinent labs & imaging results that were available during my care of the patient were reviewed by me and considered in my medical decision making (see chart for details).  Maciah Schweigert Dieckman was evaluated in Emergency Department on 01/27/2021 for the symptoms described in the history of present illness. He was evaluated in the context of the global COVID-19 pandemic, which necessitated consideration that the patient might be at risk for infection with the SARS-CoV-2 virus that causes COVID-19. Institutional protocols and algorithms that pertain to the evaluation of patients at risk for COVID-19 are in a state of rapid change based on information released by regulatory bodies  including the CDC and federal and state organizations. These policies and algorithms were followed during the patient's care in the ED.   Patient presents with a brief episode of lightheadedness which I think is due to vasovagal episode.  No chest pain or other focal pain complaints or other focal symptoms.   Considering the patient's symptoms, medical history, and physical examination today, I have low suspicion for ACS, PE, TAD, pneumothorax, carditis, mediastinitis, pneumonia, CHF, or sepsis. Due to the change in EKG from a previous one 3 years ago, will obtain troponins.  CBC shows a leukocytosis of 18,000.  He does not have any focal infectious symptoms, benign abdominal exam, will obtain COVID/flu swab and urinalysis.  Chest x-ray appears normal.  Patient is nontoxic, feeling much better, and I think stable for discharge unless delta troponin is markedly abnormal.    ----------------------------------------- 5:40 PM on 01/27/2021 ----------------------------------------- Urinalysis, second troponin, COVID and flu were all normal.  Remains asymptomatic, stable for discharge.   ____________________________________________   FINAL CLINICAL IMPRESSION(S) / ED DIAGNOSES    Final diagnoses:  Near syncope  Vasovagal episode     ED Discharge Orders     None       Portions of this note were generated with dragon dictation software. Dictation errors may occur despite best attempts at proofreading.    Carrie Mew, MD 01/27/21 1740

## 2021-01-27 NOTE — ED Triage Notes (Signed)
Pt reports that he was in the bathroom shaving this am and felt dizzy and nauseated. He went to lay back down in the , bed than got up and went some breakfast. He is feeling a little bit better than what he did this am

## 2021-01-27 NOTE — ED Notes (Signed)
Pt NAD, a/ox4. Pt verbalizes understanding of all DC and f/u instructions. All questions answered. Pt walks with steady gait to lobby at DC with family member.

## 2021-02-02 ENCOUNTER — Ambulatory Visit: Payer: Medicare Other

## 2021-02-10 ENCOUNTER — Ambulatory Visit: Payer: BC Managed Care – PPO | Admitting: Cardiology

## 2021-02-24 ENCOUNTER — Ambulatory Visit (INDEPENDENT_AMBULATORY_CARE_PROVIDER_SITE_OTHER): Payer: Medicare Other | Admitting: Dermatology

## 2021-02-24 ENCOUNTER — Encounter: Payer: Self-pay | Admitting: Dermatology

## 2021-02-24 ENCOUNTER — Other Ambulatory Visit: Payer: Self-pay

## 2021-02-24 DIAGNOSIS — L570 Actinic keratosis: Secondary | ICD-10-CM

## 2021-02-24 DIAGNOSIS — D2239 Melanocytic nevi of other parts of face: Secondary | ICD-10-CM

## 2021-02-24 DIAGNOSIS — L821 Other seborrheic keratosis: Secondary | ICD-10-CM

## 2021-02-24 DIAGNOSIS — D225 Melanocytic nevi of trunk: Secondary | ICD-10-CM

## 2021-02-24 NOTE — Progress Notes (Signed)
   Follow-Up Visit   Subjective  Bryan Avila is a 80 y.o. male who presents for the following: Seborrheic Keratosis (4 months f/u on irritated crusty growth on the scalp and face ).  Wife with patient   The following portions of the chart were reviewed this encounter and updated as appropriate:   Tobacco  Allergies  Meds  Problems  Med Hx  Surg Hx  Fam Hx      Review of Systems:  No other skin or systemic complaints except as noted in HPI or Assessment and Plan.  Objective  Well appearing patient in no apparent distress; mood and affect are within normal limits.  A focused examination was performed including face,scalp, chest. Relevant physical exam findings are noted in the Assessment and Plan.  Scalp, face Stuck-on, waxy, tan-brown papules and plaques -- Discussed benign etiology and prognosis.   frontal scalp x2, vertex scalp x 1 (3) Erythematous thin papules/macules with gritty scale.    Assessment & Plan  Seborrheic keratosis Scalp, face  Reassured benign age-related growth.  Recommend observation.  Discussed cryotherapy or numbing with curettage if spot(s) become irritated or inflamed.   AK (actinic keratosis) (3) frontal scalp x2, vertex scalp x 1  Prior to procedure, discussed risks of blister formation, small wound, skin dyspigmentation, or rare scar following cryotherapy. Recommend Vaseline ointment to treated areas while healing.   Destruction of lesion - frontal scalp x2, vertex scalp x 1 Complexity: simple   Destruction method: cryotherapy   Informed consent: discussed and consent obtained   Timeout:  patient name, date of birth, surgical site, and procedure verified Lesion destroyed using liquid nitrogen: Yes   Region frozen until ice ball extended beyond lesion: Yes   Outcome: patient tolerated procedure well with no complications   Post-procedure details: wound care instructions given    Melanocytic Nevi Chest, face - Tan-brown and/or  pink-flesh-colored symmetric macules and papules - Benign appearing on exam today - Observation - Call clinic for new or changing moles - Recommend daily use of broad spectrum spf 30+ sunscreen to sun-exposed areas.    Return in about 3 months (around 05/27/2021) for TBSE, hx of skin cancer, hx of AKs, hx of ISKs .  I, Marye Round, CMA, am acting as scribe for Forest Gleason, MD .   Documentation: I have reviewed the above documentation for accuracy and completeness, and I agree with the above.  Forest Gleason, MD

## 2021-02-24 NOTE — Patient Instructions (Addendum)

## 2021-05-01 ENCOUNTER — Ambulatory Visit
Admission: RE | Admit: 2021-05-01 | Discharge: 2021-05-01 | Disposition: A | Discharge status: Home / Self Care | Payer: Medicare Other | Source: Ambulatory Visit | Attending: Medical Oncology | Admitting: Medical Oncology

## 2021-05-01 ENCOUNTER — Other Ambulatory Visit: Payer: Self-pay

## 2021-05-01 VITALS — BP 136/79 | HR 72 | Temp 98.9°F | Resp 18

## 2021-05-01 DIAGNOSIS — R0981 Nasal congestion: Secondary | ICD-10-CM

## 2021-05-01 DIAGNOSIS — J3489 Other specified disorders of nose and nasal sinuses: Secondary | ICD-10-CM | POA: Diagnosis not present

## 2021-05-01 DIAGNOSIS — R051 Acute cough: Secondary | ICD-10-CM | POA: Diagnosis not present

## 2021-05-01 LAB — POCT INFLUENZA A/B
Influenza A, POC: NEGATIVE
Influenza B, POC: NEGATIVE

## 2021-05-01 MED ORDER — FLUTICASONE PROPIONATE 50 MCG/ACT NA SUSP: 2.0000 | Freq: Every day | NASAL | 0 refills | Status: DC

## 2021-05-01 MED ORDER — BENZONATATE 100 MG PO CAPS: 100.0000 mg | ORAL_CAPSULE | Freq: Three times a day (TID) | ORAL | 0 refills | Status: DC

## 2021-05-01 NOTE — ED Provider Notes (Addendum)
Roderic Palau    CSN: [id number] Arrival date & time: 05/01/21  1544      History   Chief Complaint Chief Complaint  Patient presents with   Cough   Nasal Congestion   Sore Throat    HPI Bryan Avila is a 80 y.o. male. Patient presents with his wife.   HPI  Cough: Patient reports that they have had symptoms of dry cough, nasal congestion, sore throat for the past 2 days. Symptoms are stable. They deny SOB, chest pain, fever or vomiting. They have tried mucinex for symptoms. No known sick contacts.    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 Authorizing Provider  acyclovir (ZOVIRAX) 400 MG tablet Take 400 mg by mouth 2 (two) times daily. 07/06/15   [provider]  Multiple Vitamins-Minerals (PRESERVISION AREDS PO) Take by mouth 2 (two) times daily.    [provider]  timolol (TIMOPTIC) 0.5 % ophthalmic solution Place 1 drop into the right eye daily.  06/10/16   [provider]    Family History Family History  Problem Relation Age of Onset   Arthritis  Father    Sleep apnea Father    Alcohol abuse Brother    Prostate cancer Brother    Cancer Paternal Grandfather        prostate    Social History Social History   Tobacco Use   Smoking status: Never   Smokeless tobacco: Never  Vaping Use   Vaping Use: Never used  Substance Use Topics   Alcohol use: No   Drug use: No     Allergies   Demerol [meperidine]   Review of Systems Review of Systems  As stated above in HPI Physical Exam Triage Vital Signs ED Triage Vitals  Enc Vitals Group     BP 05/01/21 1610 136/79     Pulse Rate 05/01/21 1610 72     Resp 05/01/21 1610 18     Temp 05/01/21 1610 98.9 F (37.2 C)     Temp Source 05/01/21 1610 Oral     SpO2 05/01/21 1610 95 %     Weight --      Height --      Head Circumference --      Peak Flow --      Pain Score 05/01/21 1635 0     Pain Loc --      Pain Edu? --      Excl. in [place]? --    No data  found.  Updated Vital Signs BP 136/79 (BP Location: Left Arm)    Pulse 72    Temp 98.9 F (37.2 C) (Oral)    Resp 18    SpO2 95%   Physical Exam Vitals and nursing note reviewed.  Constitutional:      General: He is not in acute distress.    Appearance: Normal appearance. He is not ill-appearing, toxic-appearing or diaphoretic.  HENT:     Head: Normocephalic and atraumatic.     Right Ear: Tympanic membrane normal.     Left Ear: Tympanic membrane normal.     Nose: Congestion and rhinorrhea present.     Mouth/Throat:     Mouth: Mucous membranes are moist.     Pharynx: Oropharynx is clear. No oropharyngeal exudate or posterior oropharyngeal erythema.  Eyes:     General: No scleral icterus.       Right eye: No discharge.        Left eye: No discharge.     Extraocular Movements: Extraocular movements intact.     Conjunctiva/sclera: Conjunctivae normal.     Pupils: Pupils are equal, round, and reactive to light.  Cardiovascular:     Rate and Rhythm: Normal rate and regular rhythm.     Heart sounds: Normal heart  sounds.  Pulmonary:     Effort: Pulmonary effort is normal.     Breath sounds: Normal breath sounds.  Musculoskeletal:     Cervical back: Normal range of motion and neck supple.  Lymphadenopathy:     Cervical: No cervical adenopathy.  Skin:    General: Skin is warm.     Coloration: Skin is not jaundiced or pale.  Neurological:     Mental Status: He is alert and oriented to person, place, and time.     UC Treatments / Results  Labs (all labs ordered are listed, but only abnormal results are displayed) Labs Reviewed - No data to display  EKG   Radiology No results found.  Procedures Procedures (including critical care time)  Medications Ordered in UC Medications - No data to display  Initial Impression / Assessment and Plan / UC Course  I have reviewed the triage vital signs and the nursing notes.  Pertinent labs & imaging results that were available during my care of the patient were reviewed by me and considered in my medical decision making (see chart for details).     New. Appears viral in nature. Influenza testing is negative in office today. COVID-19 testing pending. For now will send in tessalon and flonase to help along with rest and hydration. Discussed red flag signs and symptoms. Follow up PRN.  Final Clinical Impressions(s) / UC Diagnoses   Final diagnoses:  None   Discharge Instructions   None    ED Prescriptions   None    PDMP not reviewed this encounter.   Hughie Closs, PA-C 05/01/21 [place], PA-C 05/01/21 [phone]

## 2021-05-01 NOTE — ED Triage Notes (Signed)
Pt here with URI sx x 2 days. No fever.

## 2021-05-02 LAB — NOVEL CORONAVIRUS, NAA: SARS-CoV-2, NAA: DETECTED — AB

## 2021-05-02 LAB — SARS-COV-2, NAA 2 DAY TAT

## 2021-06-03 ENCOUNTER — Ambulatory Visit (INDEPENDENT_AMBULATORY_CARE_PROVIDER_SITE_OTHER): Payer: Medicare Other | Admitting: Dermatology

## 2021-06-03 ENCOUNTER — Other Ambulatory Visit: Payer: Self-pay

## 2021-06-03 DIAGNOSIS — Z1283 Encounter for screening for malignant neoplasm of skin: Secondary | ICD-10-CM | POA: Diagnosis not present

## 2021-06-03 DIAGNOSIS — L578 Other skin changes due to chronic exposure to nonionizing radiation: Secondary | ICD-10-CM | POA: Diagnosis not present

## 2021-06-03 DIAGNOSIS — D229 Melanocytic nevi, unspecified: Secondary | ICD-10-CM | POA: Diagnosis not present

## 2021-06-03 DIAGNOSIS — D1800 Hemangioma unspecified site: Secondary | ICD-10-CM

## 2021-06-03 DIAGNOSIS — L821 Other seborrheic keratosis: Secondary | ICD-10-CM

## 2021-06-03 DIAGNOSIS — Z85828 Personal history of other malignant neoplasm of skin: Secondary | ICD-10-CM | POA: Diagnosis not present

## 2021-06-03 DIAGNOSIS — L814 Other melanin hyperpigmentation: Secondary | ICD-10-CM

## 2021-06-03 DIAGNOSIS — L570 Actinic keratosis: Secondary | ICD-10-CM

## 2021-06-03 NOTE — Patient Instructions (Addendum)
Actinic keratoses are precancerous spots that appear secondary to cumulative UV radiation exposure/sun exposure over time. They are chronic with expected duration over 1 year. A portion of actinic keratoses will progress to squamous cell carcinoma of the skin. It is not possible to reliably predict which spots will progress to skin cancer and so treatment is recommended to prevent development of skin cancer.  Recommend daily broad spectrum sunscreen SPF 30+ to sun-exposed areas, reapply every 2 hours as needed.  Recommend staying in the shade or wearing long sleeves, sun glasses (UVA+UVB protection) and wide brim hats (4-inch brim around the entire circumference of the hat). Call for new or changing lesions.   Cryotherapy Aftercare  Wash gently with soap and water everyday.   Apply Vaseline and Band-Aid daily until healed.      Melanoma ABCDEs  Melanoma is the most dangerous type of skin cancer, and is the leading cause of death from skin disease.  You are more likely to develop melanoma if you: Have light-colored skin, light-colored eyes, or red or blond hair Spend a lot of time in the sun Tan regularly, either outdoors or in a tanning bed Have had blistering sunburns, especially during childhood Have a close family member who has had a melanoma Have atypical moles or large birthmarks  Early detection of melanoma is key since treatment is typically straightforward and cure rates are extremely high if we catch it early.   The first sign of melanoma is often a change in a mole or a new dark spot.  The ABCDE system is a way of remembering the signs of melanoma.  A for asymmetry:  The two halves do not match. B for border:  The edges of the growth are irregular. C for color:  A mixture of colors are present instead of an even brown color. D for diameter:  Melanomas are usually (but not always) greater than 51mm - the size of a pencil eraser. E for evolution:  The spot keeps changing in  size, shape, and color.  Please check your skin once per month between visits. You can use a small mirror in front and a large mirror behind you to keep an eye on the back side or your body.   If you see any new or changing lesions before your next follow-up, please call to schedule a visit.  Please continue daily skin protection including broad spectrum sunscreen SPF 30+ to sun-exposed areas, reapplying every 2 hours as needed when you're outdoors.   Staying in the shade or wearing long sleeves, sun glasses (UVA+UVB protection) and wide brim hats (4-inch brim around the entire circumference of the hat) are also recommended for sun protection.    If You Need Anything After Your Visit  If you have any questions or concerns for your doctor, please call our main line at [phone] and press option 4 to reach your doctor's medical assistant. If no one answers, please leave a voicemail as directed and we will return your call as soon as possible. Messages left after 4 pm will be answered the following business day.   You may also send [place] a message via [place]. We typically respond to MyChart messages within 1-2 business days.  For prescription refills, please ask your pharmacy to contact our office. Our fax number is [phone].  If you have an urgent issue when the clinic is closed that cannot wait until the next business day, you can Yutzy your doctor at the number below.  Please note that while we do our best to be available for urgent issues outside of office hours, we are not available 24/7.   If you have an urgent issue and are unable to reach [place], you may choose to seek medical care at your doctor's office, retail clinic, urgent care center, or emergency room.  If you have a medical emergency, please immediately call 911 or go to the emergency department.  Pager Numbers  - Dr. Nehemiah Massed: [phone]  - Dr. Laurence Ferrari: [phone]  - Dr. Nicole Kindred: [phone]  In the event of  inclement weather, please call our main line at [phone] for an update on the status of any delays or closures.  Dermatology Medication Tips: Please keep the boxes that topical medications come in in order to help keep track of the instructions about where and how to use these. Pharmacies typically print the medication instructions only on the boxes and not directly on the medication tubes.   If your medication is too expensive, please contact our office at [phone] option 4 or send [place] a message through [place].   We are unable to tell what your co-pay for medications will be in advance as this is different depending on your insurance coverage. However, we may be able to find a substitute medication at lower cost or fill out paperwork to get insurance to cover a needed medication.   If a prior authorization is required to get your medication covered by your insurance company, please allow [place] 1-2 business days to complete this process.  Drug prices often vary depending on where the prescription is filled and some pharmacies may offer cheaper prices.  The website www.goodrx.com contains coupons for medications through different pharmacies. The prices here do not account for what the cost may be with help from insurance (it may be cheaper with your insurance), but the website can give you the price if you did not use any insurance.  - You can print the associated coupon and take it with your prescription to the pharmacy.  - You may also stop by our office during regular business hours and pick up a GoodRx coupon card.  - If you need your prescription sent electronically to a different pharmacy, notify our office through [facility] or by phone at [phone] option 4.     Si Usted Necesita Algo Despus de Su Visita  Tambin puede enviarnos un mensaje a travs de Pharmacist, community. Por lo general respondemos a los mensajes de MyChart en el transcurso de 1 a 2 das hbiles.  Para renovar  recetas, por favor pida a su farmacia que se ponga en contacto con nuestra oficina. Harland Dingwall de fax es [place] [phone].  Si tiene un asunto urgente cuando la clnica est cerrada y que no puede esperar hasta el siguiente da hbil, puede llamar/localizar a su doctor(a) al nmero que aparece a continuacin.   Por favor, tenga en cuenta que aunque hacemos todo lo posible para estar disponibles para asuntos urgentes fuera del horario de [place], no estamos disponibles las 24 horas del da, los 7 das de la [place].   Si tiene un problema urgente y no puede comunicarse con nosotros, puede optar por buscar atencin mdica  en el consultorio de su doctor(a), en una clnica privada, en un centro de atencin urgente o en una sala de emergencias.  Si tiene Engineering geologist, por favor llame inmediatamente al 911 o vaya a la sala de emergencias.  Nmeros de bper  - Dr. Nehemiah Massed:  [phone]  - Dra. Moye: [phone]  - Dra. Nicole Kindred: [phone]  En caso de inclemencias del [place], por favor llame a Johnsie Kindred principal al [phone] para una actualizacin sobre el [place] de cualquier retraso o cierre.  Consejos para la medicacin en dermatologa: Por favor, guarde las cajas en las que vienen los medicamentos de uso tpico para ayudarle a seguir las instrucciones sobre dnde y cmo usarlos. Las farmacias generalmente imprimen las instrucciones del medicamento slo en las cajas y no directamente en los tubos del [place].   Si su medicamento es muy caro, por favor, pngase en contacto con Zigmund Daniel llamando al [phone] y presione la opcin 4 o envenos un mensaje a travs de Pharmacist, community.   No podemos decirle cul ser su copago por los medicamentos por adelantado ya que esto es diferente dependiendo de la cobertura de su seguro. Sin embargo, es posible que podamos encontrar un medicamento sustituto a Electrical engineer un formulario para que el seguro cubra el medicamento  que se considera necesario.   Si se requiere una autorizacin previa para que su compaa de seguros [place] su medicamento, por favor permtanos de 1 a 2 das hbiles para completar este proceso.  Los precios de los medicamentos varan con frecuencia dependiendo del Environmental consultant de dnde se surte la receta y alguna farmacias pueden ofrecer precios ms baratos.  El sitio web www.goodrx.com tiene cupones para medicamentos de Airline pilot. Los precios aqu no tienen en cuenta lo que podra costar con la ayuda del seguro (puede ser ms barato con su seguro), pero el sitio web puede darle el precio si no utiliz Research scientist (physical sciences).  - Puede imprimir el cupn correspondiente y llevarlo con su receta a la farmacia.  - Tambin puede pasar por nuestra oficina durante el horario de atencin regular y Charity fundraiser una tarjeta de cupones de GoodRx.  - Si necesita que su receta se enve electrnicamente a una farmacia diferente, informe a nuestra oficina a travs de MyChart de [place] o por telfono llamando al [phone] y presione la opcin 4.

## 2021-06-03 NOTE — Progress Notes (Signed)
Follow-Up Visit   Subjective  Bryan Avila is a 81 y.o. male who presents for the following: Follow-up (Patient here today for tbse. Patient has history of bcc, aks and isks. ).  Patient here for full body skin exam and skin cancer screening.  The following portions of the chart were reviewed this encounter and updated as appropriate:  Tobacco   Allergies   Meds   Problems   Med Hx   Surg Hx   Fam Hx       Review of Systems: No other skin or systemic complaints except as noted in HPI or Assessment and Plan.   Objective  Well appearing patient in no apparent distress; mood and affect are within normal limits.  A full examination was performed including scalp, head, eyes, ears, nose, lips, neck, chest, axillae, abdomen, back, buttocks, bilateral upper extremities, bilateral lower extremities, hands, feet, fingers, toes, fingernails, and toenails. All findings within normal limits unless otherwise noted below.  left vertex scalp x 1, left zygoma x 1, left temple x 1, right lateral forehead x 1, left preauricular x 1, right postauricular x 1, right helix x 1, left hand x1 (8) Erythematous thin papules/macules with gritty scale.    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 They are chronic with expected duration over 1 year. A portion of actinic keratoses will progress to squamous cell carcinoma of the skin. It is not possible to reliably predict which spots will progress to skin cancer and so treatment is recommended to prevent development of skin cancer.  Recommend daily broad spectrum sunscreen SPF 30+ to sun-exposed areas,  reapply every 2 hours as needed.  Recommend staying in the shade or wearing long sleeves, sun glasses (UVA+UVB protection) and wide brim hats (4-inch brim around the entire circumference of the hat). Call for new or changing lesions.  Prior to procedure, discussed risks of blister formation, small wound, skin dyspigmentation, or rare scar following cryotherapy. Recommend Vaseline ointment to treated areas while healing.   Destruction of lesion - left vertex scalp x 1, left zygoma x 1, left temple x 1, right lateral forehead x 1, left preauricular x 1, right postauricular x 1, right helix x 1, left hand x1  Destruction method: cryotherapy   Informed consent: discussed and consent obtained   Lesion destroyed using liquid nitrogen: Yes   Cryotherapy cycles:  2 Outcome: patient tolerated procedure well with no complications   Post-procedure details: wound care instructions given   Additional details:  Prior to procedure, discussed risks of blister formation, small wound, skin dyspigmentation, or rare scar following cryotherapy. Recommend Vaseline ointment to treated areas while healing.   Lentigines - Scattered tan macules - Due to sun exposure - Benign-appearing, observe - Recommend daily broad spectrum sunscreen SPF 30+ to sun-exposed areas, reapply every 2 hours as needed. - Call for any changes  Seborrheic Keratoses - Stuck-on, waxy, tan-brown papules and/or plaques  - Benign-appearing - Discussed benign etiology and prognosis. - Observe - Call for any changes  Melanocytic Nevi - Tan-brown and/or pink-flesh-colored symmetric macules and papules - Benign appearing on exam today -  Observation - Call clinic for new or changing moles - Recommend daily use of broad spectrum spf 30+ sunscreen to sun-exposed areas.   Hemangiomas - Red papules - Discussed benign nature - Observe - Call for any changes  Actinic Damage - Chronic condition, secondary to cumulative UV/sun  exposure - diffuse scaly erythematous macules with underlying dyspigmentation - Recommend daily broad spectrum sunscreen SPF 30+ to sun-exposed areas, reapply every 2 hours as needed.  - Staying in the shade or wearing long sleeves, sun glasses (UVA+UVB protection) and wide brim hats (4-inch brim around the entire circumference of the hat) are also recommended for sun protection.  - Call for new or changing lesions.  History of Basal Cell Carcinoma of the Skin - No evidence of recurrence today at right nasal tip 2021 and left vertex scalp 2020 - Recommend regular full body skin exams - Recommend daily broad spectrum sunscreen SPF 30+ to sun-exposed areas, reapply every 2 hours as needed.  - Call if any new or changing lesions are noted between office visits  Skin cancer screening performed today. Return in about 6 months (around 12/01/2021) for ak follow up, 6 - 12 tbse hx of bcc . I, Ruthell Rummage, CMA, am acting as scribe for Forest Gleason, MD.  Documentation: I have reviewed the above documentation for accuracy and completeness, and I agree with the above.  Forest Gleason, MD

## 2021-06-05 ENCOUNTER — Encounter: Payer: Self-pay | Admitting: Dermatology

## 2021-12-17 ENCOUNTER — Ambulatory Visit (INDEPENDENT_AMBULATORY_CARE_PROVIDER_SITE_OTHER): Payer: Medicare Other | Admitting: Dermatology

## 2021-12-17 DIAGNOSIS — L570 Actinic keratosis: Secondary | ICD-10-CM

## 2021-12-17 DIAGNOSIS — L578 Other skin changes due to chronic exposure to nonionizing radiation: Secondary | ICD-10-CM | POA: Diagnosis not present

## 2021-12-17 DIAGNOSIS — L821 Other seborrheic keratosis: Secondary | ICD-10-CM | POA: Diagnosis not present

## 2021-12-17 NOTE — Patient Instructions (Addendum)
Cryotherapy Aftercare  Wash gently with soap and water everyday.   Apply Vaseline and Band-Aid daily until healed.     Due to recent changes in healthcare laws, you may see results of your pathology and/or laboratory studies on MyChart before the doctors have had a chance to review them. We understand that in some cases there may be results that are confusing or concerning to you. Please understand that not all results are received at the same time and often the doctors may need to interpret multiple results in order to provide you with the best plan of care or course of treatment. Therefore, we ask that you please give us 2 business days to thoroughly review all your results before contacting the office for clarification. Should we see a critical lab result, you will be contacted sooner.   If You Need Anything After Your Visit  If you have any questions or concerns for your doctor, please call our main line at [phone] and press option 4 to reach your doctor's medical assistant. If no one answers, please leave a voicemail as directed and we will return your call as soon as possible. Messages left after 4 pm will be answered the following business day.   You may also send us a message via MyChart. We typically respond to MyChart messages within 1-2 business days.  For prescription refills, please ask your pharmacy to contact our office. Our fax number is [phone].  If you have an urgent issue when the clinic is closed that cannot wait until the next business day, you can Dominic your doctor at the number below.    Please note that while we do our best to be available for urgent issues outside of office hours, we are not available 24/7.   If you have an urgent issue and are unable to reach us, you may choose to seek medical care at your doctor's office, retail clinic, urgent care center, or emergency room.  If you have a medical emergency, please immediately call 911 or go to the  emergency department.  Pager Numbers  - Dr. Kowalski: [phone]  - Dr. Moye: [phone]  - Dr. Stewart: [phone]  In the event of inclement weather, please call our main line at [phone] for an update on the status of any delays or closures.  Dermatology Medication Tips: Please keep the boxes that topical medications come in in order to help keep track of the instructions about where and how to use these. Pharmacies typically print the medication instructions only on the boxes and not directly on the medication tubes.   If your medication is too expensive, please contact our office at [phone] option 4 or send us a message through MyChart.   We are unable to tell what your co-pay for medications will be in advance as this is different depending on your insurance coverage. However, we may be able to find a substitute medication at lower cost or fill out paperwork to get insurance to cover a needed medication.   If a prior authorization is required to get your medication covered by your insurance company, please allow us 1-2 business days to complete this process.  Drug prices often vary depending on where the prescription is filled and some pharmacies may offer cheaper prices.  The website www.goodrx.com contains coupons for medications through different pharmacies. The prices here do not account for what the cost may be with help from insurance (it may be cheaper with your insurance), but the website can   give you the price if you did not use any insurance.  - You can print the associated coupon and take it with your prescription to the pharmacy.  - You may also stop by our office during regular business hours and pick up a GoodRx coupon card.  - If you need your prescription sent electronically to a different pharmacy, notify our office through [place] MyChart or by phone at [phone] option 4.     Si Usted Necesita Algo Despus de Su Visita  Tambin puede  enviarnos un mensaje a travs de MyChart. Por lo general respondemos a los mensajes de MyChart en el transcurso de 1 a 2 das hbiles.  Para renovar recetas, por favor pida a su farmacia que se ponga en contacto con nuestra oficina. Nuestro nmero de fax es el [phone].  Si tiene un asunto urgente cuando la clnica est cerrada y que no puede esperar hasta el siguiente da hbil, puede llamar/localizar a su doctor(a) al nmero que aparece a continuacin.   Por favor, tenga en cuenta que aunque hacemos todo lo posible para estar disponibles para asuntos urgentes fuera del horario de oficina, no estamos disponibles las 24 horas del da, los 7 das de la semana.   Si tiene un problema urgente y no puede comunicarse con nosotros, puede optar por buscar atencin mdica  en el consultorio de su doctor(a), en una clnica privada, en un centro de atencin urgente o en una sala de emergencias.  Si tiene una emergencia mdica, por favor llame inmediatamente al 911 o vaya a la sala de emergencias.  Nmeros de bper  - Dr. Kowalski: [phone]  - Dra. Moye: [phone]  - Dra. Stewart: [phone]  En caso de inclemencias del tiempo, por favor llame a nuestra lnea principal al [phone] para una actualizacin sobre el estado de cualquier retraso o cierre.  Consejos para la medicacin en dermatologa: Por favor, guarde las cajas en las que vienen los medicamentos de uso tpico para ayudarle a seguir las instrucciones sobre dnde y cmo usarlos. Las farmacias generalmente imprimen las instrucciones del medicamento slo en las cajas y no directamente en los tubos del medicamento.   Si su medicamento es muy caro, por favor, pngase en contacto con nuestra oficina llamando al [phone] y presione la opcin 4 o envenos un mensaje a travs de MyChart.   No podemos decirle cul ser su copago por los medicamentos por adelantado ya que esto es diferente dependiendo de la cobertura de su seguro.  Sin embargo, es posible que podamos encontrar un medicamento sustituto a menor costo o llenar un formulario para que el seguro cubra el medicamento que se considera necesario.   Si se requiere una autorizacin previa para que su compaa de seguros cubra su medicamento, por favor permtanos de 1 a 2 das hbiles para completar este proceso.  Los precios de los medicamentos varan con frecuencia dependiendo del lugar de dnde se surte la receta y alguna farmacias pueden ofrecer precios ms baratos.  El sitio web www.goodrx.com tiene cupones para medicamentos de diferentes farmacias. Los precios aqu no tienen en cuenta lo que podra costar con la ayuda del seguro (puede ser ms barato con su seguro), pero el sitio web puede darle el precio si no utiliz ningn seguro.  - Puede imprimir el cupn correspondiente y llevarlo con su receta a la farmacia.  - Tambin puede pasar por nuestra oficina durante el horario de atencin regular y recoger una tarjeta de cupones de GoodRx.  -   Si necesita que su receta se enve electrnicamente a una farmacia diferente, informe a nuestra oficina a travs de MyChart de [place] o por telfono llamando al [phone] y presione la opcin 4.  

## 2021-12-17 NOTE — Progress Notes (Signed)
   Follow-Up Visit   Subjective  Bryan Avila is a 81 y.o. male who presents for the following: Follow-up (Patient here today for 6 month AK follow up. Patient does have a few spots at scalp to be checked. ).  Patient accompanied by wife who contributes to history.   The following portions of the chart were reviewed this encounter and updated as appropriate:   Tobacco  Allergies  Meds  Problems  Med Hx  Surg Hx  Fam Hx      Review of Systems:  No other skin or systemic complaints except as noted in HPI or Assessment and Plan.  Objective  Well appearing patient in no apparent distress; mood and affect are within normal limits.  A focused examination was performed including face, scalp. Relevant physical exam findings are noted in the Assessment and Plan.  left frontal scalp x 3, nose x 1 (4) Erythematous thin papules/macules with gritty scale.     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 They are chronic with expected duration over 1 year. A portion of actinic keratoses will progress to squamous cell carcinoma of the skin. It is not possible to reliably predict which spots will progress to skin cancer and so treatment is recommended to prevent development of skin cancer.  Recommend daily broad spectrum sunscreen SPF 30+ to sun-exposed areas, reapply every 2 hours as needed.  Recommend staying in the shade or wearing long sleeves, sun glasses (UVA+UVB protection) and wide brim hats (4-inch brim around the entire circumference of the hat). Call for new or changing lesions.  Prior to procedure, discussed risks of blister formation, small wound, skin dyspigmentation, or rare scar following cryotherapy. Recommend Vaseline ointment to treated areas while healing.   Destruction of lesion - left frontal scalp x 3, nose x  1  Destruction method: cryotherapy   Informed consent: discussed and consent obtained   Lesion destroyed using liquid nitrogen: Yes   Cryotherapy cycles:  2 Outcome: patient tolerated procedure well with no complications   Post-procedure details: wound care instructions given     Seborrheic Keratoses - Stuck-on, waxy, tan-brown papules and/or plaques  - Benign-appearing - Discussed benign etiology and prognosis. - Observe - Call for any changes  Actinic Damage - chronic, secondary to cumulative UV radiation exposure/sun exposure over time - diffuse scaly erythematous macules with underlying dyspigmentation - Recommend daily broad spectrum sunscreen SPF 30+ to sun-exposed areas, reapply every 2 hours as needed.  - Recommend staying in the shade or wearing long sleeves, sun glasses (UVA+UVB protection) and wide brim hats (4-inch brim around the entire circumference of the hat). - Call for new or changing lesions.  Return in about 6 months (around 06/19/2022) for TBSE.  Graciella Belton, RMA, am acting as scribe for Forest Gleason, MD .  Documentation: I have reviewed the above documentation for accuracy and completeness, and I agree with the above.  Forest Gleason, MD

## 2021-12-18 ENCOUNTER — Telehealth: Payer: Self-pay | Admitting: Primary Care

## 2021-12-18 NOTE — Telephone Encounter (Signed)
Left message for patient to call back and schedule Medicare Annual Wellness Visit (AWV).   Please offer to do virtually or by telephone.   Last AWV: 12/30/2021  Please schedule at anytime with [facility] Advisor schedule   45 minute appointent  If any questions, please contact me at [phone]

## 2021-12-30 ENCOUNTER — Encounter: Payer: Self-pay | Admitting: Dermatology

## 2022-01-01 ENCOUNTER — Ambulatory Visit (INDEPENDENT_AMBULATORY_CARE_PROVIDER_SITE_OTHER): Payer: Medicare Other | Admitting: Primary Care

## 2022-01-01 ENCOUNTER — Encounter: Payer: Self-pay | Admitting: Primary Care

## 2022-01-01 VITALS — BP 128/74 | HR 68 | Temp 98.6°F | Resp 16 | Ht 66.5 in | Wt 146.4 lb

## 2022-01-01 DIAGNOSIS — M1990 Unspecified osteoarthritis, unspecified site: Secondary | ICD-10-CM

## 2022-01-01 DIAGNOSIS — E7800 Pure hypercholesterolemia, unspecified: Secondary | ICD-10-CM

## 2022-01-01 DIAGNOSIS — H3530 Unspecified macular degeneration: Secondary | ICD-10-CM | POA: Diagnosis not present

## 2022-01-01 DIAGNOSIS — B009 Herpesviral infection, unspecified: Secondary | ICD-10-CM | POA: Diagnosis not present

## 2022-01-01 DIAGNOSIS — Z1211 Encounter for screening for malignant neoplasm of colon: Secondary | ICD-10-CM | POA: Insufficient documentation

## 2022-01-01 DIAGNOSIS — K641 Second degree hemorrhoids: Secondary | ICD-10-CM | POA: Insufficient documentation

## 2022-01-01 HISTORY — DX: Encounter for screening for malignant neoplasm of colon: Z12.11

## 2022-01-01 LAB — COMPREHENSIVE METABOLIC PANEL
ALT: 12 U/L (ref 0–53)
AST: 15 U/L (ref 0–37)
Albumin: 3.9 g/dL (ref 3.5–5.2)
Alkaline Phosphatase: 70 U/L (ref 39–117)
BUN: 17 mg/dL (ref 6–23)
CO2: 32 mEq/L (ref 19–32)
Calcium: 9.5 mg/dL (ref 8.4–10.5)
Chloride: 102 mEq/L (ref 96–112)
Creatinine, Ser: 1.01 mg/dL (ref 0.40–1.50)
GFR: 70.01 mL/min (ref >60.00)
Glucose, Bld: 89 mg/dL (ref 70–99)
Potassium: 4 mEq/L (ref 3.5–5.1)
Sodium: 142 mEq/L (ref 135–145)
Total Bilirubin: 0.6 mg/dL (ref 0.2–1.2)
Total Protein: 6.7 g/dL (ref 6.0–8.3)

## 2022-01-01 LAB — LIPID PANEL
Cholesterol: 186 mg/dL (ref 0–200)
HDL: 67.3 mg/dL (ref >39.00)
LDL Cholesterol: 98 mg/dL (ref 0–99)
NonHDL: 118.95
Total CHOL/HDL Ratio: 3
Triglycerides: 105 mg/dL (ref 0.0–149.0)
VLDL: 21 mg/dL (ref 0.0–40.0)

## 2022-01-01 NOTE — Assessment & Plan Note (Signed)
Controlled.  No concerns today. Continue to monitor.  

## 2022-01-01 NOTE — Assessment & Plan Note (Signed)
Following with ophthalmology. Continue injections every 10 weeks. Continue Timolol drops daily.

## 2022-01-01 NOTE — Assessment & Plan Note (Signed)
Well controlled of treatment. Repeat lipid panel pending today.  Continue regular walking and a healthy diet.

## 2022-01-01 NOTE — Progress Notes (Signed)
Subjective:    Patient ID: Bryan Avila, male    DOB: [birth year], 81 y.o.   MRN: [id number]  HPI  Bryan Avila is a very pleasant 81 y.o. male with a history of right bundle branch block, osteoarthritis, hyperlipidemia, herpes simplex who presents today for follow-up of chronic conditions. His wife joins [place] today.   1) Herpes Simplex/Macular Degeneration: Currently managed on 400 mg twice daily and Timolol drops. Also receives injections every 10 weeks. Following with ophthalmology, last visit was in August 2023.   2) Hyperlipidemia: Not currently on treatment.  Last lipid panel with LDL of 100, HDL of 72. He endorses a healthy diet, walks daily. He denies chest pain, dizziness, shortness of breath.   BP Readings from Last 3 Encounters:  01/01/22 128/74  05/01/21 136/79  01/27/21 119/69   3) Actinic Keratosis: Chronic. Following with dermatology, last office visit was 12/17/21 for follow up of [place]. During this visit he underwent cryotherapy to a few spots to his scalp. Due for follow up visit in February 2024.   Review of Systems  Respiratory:  Negative for shortness of breath.   Cardiovascular:  Negative for chest pain.  Musculoskeletal:  Negative for arthralgias.  Neurological:  Negative for dizziness and headaches.  Psychiatric/Behavioral:  The patient is not nervous/anxious.          Past Medical History:  Diagnosis Date   Basal cell carcinoma 07/05/2018   left vertex scalp   Basal cell carcinoma 05/17/2019   right nasal tip/Moh's   Cataract 03/2009   left eye   Hyperlipidemia    Osteoporosis    osteoarthritis    Social History   Socioeconomic History   Marital status: Married    Spouse name: Not on file   Number of children: 1   Years of education: Not on file   Highest education level: Not on file  Occupational History   Occupation: Truck Neurosurgeon HV/AC   Tobacco Use   Smoking status: Never   Smokeless tobacco: Never  Vaping Use    Vaping Use: Never used  Substance and Sexual Activity   Alcohol use: No   Drug use: No   Sexual activity: Never  Other Topics Concern   Not on file  Social History Narrative   Hobbies: Consulting civil engineer    Rents [phone]   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   Would not want Tube Feeds.   Does not have a living will or HPOA.   Social Determinants of Health   Financial Resource Strain: Not on file  Food Insecurity: Not on file  Transportation Needs: Not on file  Physical Activity: Not on file  Stress: Not on file  Social Connections: Not on file  Intimate Partner Violence: Not on file    Past Surgical History:  Procedure Laterality Date   EYE SURGERY     thumb surgery  1999   right    Family History  Problem Relation Age of Onset   Arthritis Father    Sleep apnea Father    Alcohol abuse Brother    Prostate cancer Brother    Cancer Paternal Grandfather        prostate    Allergies  Allergen Reactions   Demerol [Meperidine] Other (See Comments)    "I almost died from that."-Severe bradycardia    Current Outpatient Medications on File Prior to Visit  Medication Sig Dispense Refill   acyclovir (ZOVIRAX) 400 MG tablet  Take 400 mg by mouth 2 (two) times daily.  99   Multiple Vitamins-Minerals (PRESERVISION AREDS PO) Take by mouth 2 (two) times daily.     timolol (TIMOPTIC) 0.5 % ophthalmic solution Place 1 drop into the right eye daily.      benzonatate (TESSALON) 100 MG capsule Take 1 capsule (100 mg total) by mouth every 8 (eight) hours. (Patient not taking: Reported on 01/01/2022) 21 capsule 0   fluticasone (FLONASE) 50 MCG/ACT nasal spray Place 2 sprays into both nostrils daily. (Patient not taking: Reported on 01/01/2022) 16 mL 0   No current facility-administered medications on file prior to visit.    BP 128/74   Pulse 68   Temp 98.6 F (37 C)   Resp 16   Ht 5' 6.5" (1.689 m)   Wt 146 lb 6 oz (66.4 kg)   SpO2 96%   BMI 23.27 kg/m   Objective:   Physical Exam HENT:     Right Ear: Tympanic membrane and ear canal normal.     Left Ear: Tympanic membrane and ear canal normal.     Nose: Nose normal.     Right Sinus: No maxillary sinus tenderness or frontal sinus tenderness.     Left Sinus: No maxillary sinus tenderness or frontal sinus tenderness.  Eyes:     Conjunctiva/sclera: Conjunctivae normal.  Neck:     Thyroid: No thyromegaly.     Vascular: No carotid bruit.  Cardiovascular:     Rate and Rhythm: Normal rate and regular rhythm.     Heart sounds: Normal heart sounds.  Pulmonary:     Effort: Pulmonary effort is normal.     Breath sounds: Normal breath sounds. No wheezing or rales.  Abdominal:     General: Bowel sounds are normal.     Palpations: Abdomen is soft.     Tenderness: There is no abdominal tenderness.  Musculoskeletal:        General: Normal range of motion.     Cervical back: Neck supple.  Skin:    General: Skin is warm and dry.  Neurological:     Mental Status: He is alert and oriented to person, place, and time.     Cranial Nerves: No cranial nerve deficit.     Deep Tendon Reflexes: Reflexes are normal and symmetric.  Psychiatric:        Mood and Affect: Mood normal.           Assessment & Plan:   Problem List Items Addressed This Visit       Musculoskeletal and Integument   Osteoarthritis    Controlled.  No concerns today. Continue to monitor.        Other   Herpes simplex    Following with ophthalmology.  Continue acyclovir 400 mg BID.      HYPERCHOLESTEROLEMIA, PURE - Primary    Well controlled of treatment. Repeat lipid panel pending today.  Continue regular walking and a healthy diet.      Relevant Orders   Lipid panel   Comprehensive metabolic panel   Macular degeneration    Following with ophthalmology. Continue injections every 10 weeks. Continue Timolol drops daily.          Pleas Koch, NP

## 2022-01-01 NOTE — Patient Instructions (Signed)
Check back next month for the high dose flu shot.  Stop by the lab prior to leaving today. I will notify you of your results once received.   It was a pleasure to see you today!

## 2022-01-01 NOTE — Assessment & Plan Note (Signed)
Following with ophthalmology.  Continue acyclovir 400 mg BID.

## 2022-01-04 ENCOUNTER — Ambulatory Visit (INDEPENDENT_AMBULATORY_CARE_PROVIDER_SITE_OTHER): Payer: Medicare Other

## 2022-01-04 VITALS — Ht 66.5 in | Wt 146.0 lb

## 2022-01-04 DIAGNOSIS — Z0000 Encounter for general adult medical examination without abnormal findings: Secondary | ICD-10-CM | POA: Diagnosis not present

## 2022-01-04 NOTE — Progress Notes (Signed)
Virtual Visit via Telephone Note  I connected with  Bryan Avila on 01/04/22 at  9:30 AM EDT by telephone and verified that I am speaking with the correct person using two identifiers.  Location: Patient: home Provider: [place] Persons participating in the virtual visit: [place]   I discussed the limitations, risks, security and privacy concerns of performing an evaluation and management service by telephone and the availability of in person appointments. The patient expressed understanding and agreed to proceed.  Interactive audio and video telecommunications were attempted between this nurse and patient, however failed, due to patient having technical difficulties OR patient did not have access to video capability.  We continued and completed visit with audio only.  Some vital signs may be absent or patient reported.   Dionisio David, LPN  Subjective:   Bryan Avila is a 81 y.o. male who presents for Medicare Annual/Subsequent preventive examination.  Review of Systems     Cardiac Risk Factors include: advanced age (>65mn, >>9women);male gender;dyslipidemia     Objective:    There were no vitals filed for this visit. There is no height or weight on file to calculate BMI.     01/04/2022    9:28 AM 12/06/2017    8:58 AM 11/25/2016   12:18 PM 12/16/2014    7:15 PM  Advanced Directives  Does Patient Have a Medical Advance Directive? No No No No  Does patient want to make changes to medical advance directive?   Yes (MAU/Ambulatory/Procedural Areas - Information given)   Would patient like information on creating a medical advance directive? No - Patient declined No - Patient declined      Current Medications (verified) Outpatient Encounter Medications as of 01/04/2022  Medication Sig   acyclovir (ZOVIRAX) 400 MG tablet Take 400 mg by mouth 2 (two) times daily.   Multiple Vitamins-Minerals (PRESERVISION AREDS PO) Take by mouth 2 (two) times daily.    timolol (TIMOPTIC) 0.5 % ophthalmic solution Place 1 drop into the right eye daily.    benzonatate (TESSALON) 100 MG capsule Take 1 capsule (100 mg total) by mouth every 8 (eight) hours. (Patient not taking: Reported on 01/01/2022)   fluticasone (FLONASE) 50 MCG/ACT nasal spray Place 2 sprays into both nostrils daily. (Patient not taking: Reported on 01/01/2022)   No facility-administered encounter medications on file as of 01/04/2022.    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   left vertex scalp   Basal cell carcinoma 05/17/2019   right nasal tip/Moh's   Cataract 03/2009   left eye   Hyperlipidemia    Osteoporosis    osteoarthritis   Past Surgical History:  Procedure Laterality Date   EYE SURGERY     thumb surgery  1999   right   Family History  Problem Relation Age of Onset   Arthritis Father    Sleep apnea Father    Alcohol abuse Brother    Prostate cancer Brother    Cancer Paternal Grandfather        prostate   Social History   Socioeconomic History   Marital status: Married    Spouse name: Not on file   Number of children: 1   Years of education: Not on file   Highest education level: Not on file  Occupational History   Occupation: Truck DNeurosurgeonHV/AC   Tobacco Use   Smoking status: Never   Smokeless  tobacco: Never  Vaping Use   Vaping Use: Never used  Substance and Sexual Activity   Alcohol use: No   Drug use: No   Sexual activity: Never  Other Topics Concern   Not on file  Social History Narrative   Hobbies: Fly airplane    Rents [phone]   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   Would not want Tube Feeds.   Does not have a living will or HPOA.   Social Determinants of Health   Financial Resource Strain: Low Risk  (01/04/2022)   Overall Financial Resource Strain (CARDIA)    Difficulty of Paying Living Expenses: Not hard  at all  Food Insecurity: No Food Insecurity (01/04/2022)   Hunger Vital Sign    Worried About Running Out of Food in the Last Year: Never true    Ran Out of Food in the Last Year: Never true  Transportation Needs: No Transportation Needs (01/04/2022)   PRAPARE - Hydrologist (Medical): No    Lack of Transportation (Non-Medical): No  Physical Activity: Sufficiently Active (01/04/2022)   Exercise Vital Sign    Days of Exercise per Week: 5 days    Minutes of Exercise per Session: 30 min  Stress: No Stress Concern Present (01/04/2022)   [place]    Feeling of Stress : Not at all  Social Connections: Moderately Integrated (01/04/2022)   Social Connection and Isolation Panel [NHANES]    Frequency of Communication with Friends and Family: More than three times a week    Frequency of Social Gatherings with Friends and Family: Once a week    Attends Religious Services: More than 4 times per year    Active Member of [employer] or Organizations: No    Attends Music therapist: Never    Marital Status: Married    Tobacco Counseling Counseling given: Not Answered   Clinical Intake:  Pre-visit preparation completed: Yes  Pain : No/denies pain     Nutritional Risks: None Diabetes: No  How often do you need to have someone help you when you read instructions, pamphlets, or other written materials from your doctor or pharmacy?: 1 - Never  Diabetic?no  Interpreter Needed?: No  Information entered by :: Kirke Shaggy, LPN   Activities of Daily Living    01/04/2022    9:29 AM 12/30/2021    6:26 PM  In your present state of health, do you have any difficulty performing the following activities:  Hearing? 1 1  Vision? 1 1  Difficulty concentrating or making decisions? 0 0  Walking or climbing stairs? 0 0  Dressing or bathing? 0 0  Doing errands, shopping? 0 0  Preparing Food and  eating ? N N  Using the Toilet? N N  In the past six months, have you accidently leaked urine? N N  Do you have problems with loss of bowel control? N N  Managing your Medications? N N  Managing your Finances? N N  Housekeeping or managing your Housekeeping? N N    Patient Care Team: Pleas Koch, NP as PCP - General (Internal Medicine) Minna Merritts, MD as Consulting Physician (Cardiology) Birder Robson, MD as Referring Physician (Ophthalmology) Laurence Ferrari, [place], MD as Consulting Physician (Dermatology)  Indicate any recent Medical Services you may have received from other than [facility] providers in the past year (date may be approximate).     Assessment:  This is a routine wellness examination for Bryan Avila.  Hearing/Vision screen Hearing Screening - Comments:: No aids Vision Screening - Comments:: Wears glasses- [facility]  Dietary issues and exercise activities discussed: Current Exercise Habits: Home exercise routine, Type of exercise: walking, Time (Minutes): 30, Frequency (Times/Week): 5, Weekly Exercise (Minutes/Week): 150, Intensity: Mild   Goals Addressed             This Visit's Progress    DIET - EAT MORE FRUITS AND VEGETABLES         Depression Screen    01/04/2022    9:27 AM 12/30/2020    9:24 AM 12/19/2018    9:08 AM 12/06/2017    8:57 AM 11/25/2016   12:18 PM 11/25/2015   12:07 PM 11/21/2014    8:41 AM  PHQ 2/9 Scores  PHQ - 2 Score 0 0 0 0 0 0 0  PHQ- 9 Score 0 0  0       Fall Risk    01/04/2022    9:29 AM 12/30/2021    6:26 PM 12/30/2020    9:45 AM 12/30/2020    9:21 AM 12/19/2018    9:08 AM  Fall Risk   Falls in the past year? 0 0 0 0 0  Number falls in past yr: 0  0 0 0  Injury with Fall? 0  0 0 0  Risk for fall due to : No Fall Risks   History of fall(s)   Follow up Falls evaluation completed        FALL RISK PREVENTION PERTAINING TO THE HOME:  Any stairs in or around the home? Yes  If so, are there any without handrails? No   Home free of loose throw rugs in walkways, pet beds, electrical cords, etc? Yes  Adequate lighting in your home to reduce risk of falls? Yes   ASSISTIVE DEVICES UTILIZED TO PREVENT FALLS:  Life alert? No  Use of a cane, walker or w/c? No  Grab bars in the bathroom? No  Shower chair or bench in shower? No  Elevated toilet seat or a handicapped toilet? No     Cognitive Function:    12/06/2017    8:58 AM 11/25/2016   12:20 PM  MMSE - Mini Mental State Exam  Orientation to time 5 5  Orientation to Place 5 5  Registration 3 3  Attention/ Calculation 0 0  Recall 3 3  Language- name 2 objects 0 0  Language- repeat 1 1  Language- follow 3 step command 3 3  Language- read & follow direction 0 0  Write a sentence 0 0  Copy design 0 0  Total score 20 20        01/04/2022    9:30 AM  6CIT Screen  What Year? 0 points  What month? 0 points  What time? 0 points  Count back from 20 0 points  Months in reverse 0 points  Repeat phrase 0 points  Total Score 0 points    Immunizations Immunization History  Administered Date(s) Administered   Fluad Quad(high Dose 65+) 01/09/2021   Influenza Whole 04/09/2000, 03/10/2007, 02/07/2008, 02/12/2009, 02/10/2010   Influenza, High Dose Seasonal PF 01/28/2015, 01/24/2016, 02/04/2017, 02/07/2018   Influenza-Unspecified 02/04/2017   PFIZER(Purple Top)SARS-COV-2 Vaccination 07/18/2019, 08/08/2019   Pneumococcal Conjugate-13 11/19/2013   Pneumococcal Polysaccharide-23 03/31/2007   Td 05/10/1996, 10/28/2006   Tdap 12/16/2014   Zoster, Live 03/25/2010    TDAP status: Up to date  Flu Vaccine status: Up to date  Pneumococcal vaccine status: Up to date  Covid-19 vaccine status: Completed vaccines  Qualifies for Shingles Vaccine? Yes   Zostavax completed Yes   Shingrix Completed?: No.    Education has been provided regarding the importance of this vaccine. Patient has been advised to call insurance company to determine out of pocket  expense if they have not yet received this vaccine. Advised may also receive vaccine at local pharmacy or Health Dept. Verbalized acceptance and understanding.  Screening Tests Health Maintenance  Topic Date Due   Zoster Vaccines- Shingrix (1 of 2) Never done   INFLUENZA VACCINE  12/08/2021   TETANUS/TDAP  12/15/2024   Pneumonia Vaccine 54+ Years old  Completed   HPV VACCINES  Aged Out   COVID-19 Vaccine  Discontinued    Health Maintenance  Health Maintenance Due  Topic Date Due   Zoster Vaccines- Shingrix (1 of 2) Never done   INFLUENZA VACCINE  12/08/2021    Colorectal cancer screening: No longer required.   Lung Cancer Screening: (Low Dose CT Chest recommended if Age 44-80 years, 30 pack-year currently smoking OR have quit w/in 15years.) does not qualify.   Additional Screening:  Hepatitis C Screening: does not qualify; Completed no  Vision Screening: Recommended annual ophthalmology exams for early detection of glaucoma and other disorders of the eye. Is the patient up to date with their annual eye exam?  Yes  Who is the provider or what is the name of the office in which the patient attends annual eye exams? Dr. George Ina If pt is not established with a provider, would they like to be referred to a provider to establish care? No .   Dental Screening: Recommended annual dental exams for proper oral hygiene  Community Resource Referral / Chronic Care Management: CRR required this visit?  No   CCM required this visit?  No      Plan:     I have personally reviewed and noted the following in the patient's chart:   Medical and social history Use of alcohol, tobacco or illicit drugs  Current medications and supplements including opioid prescriptions. Patient is not currently taking opioid prescriptions. Functional ability and status Nutritional status Physical activity Advanced directives List of other physicians Hospitalizations, surgeries, and ER visits in  previous 12 months Vitals Screenings to include cognitive, depression, and falls Referrals and appointments  In addition, I have reviewed and discussed with patient certain preventive protocols, quality metrics, and best practice recommendations. A written personalized care plan for preventive services as well as general preventive health recommendations were provided to patient.     Dionisio David, LPN   [id number]   Nurse Notes: none

## 2022-01-04 NOTE — Patient Instructions (Signed)
Mr. Bryan Avila , Thank you for taking time to come for your Medicare Wellness Visit. I appreciate your ongoing commitment to your health goals. Please review the following plan we discussed and let me know if I can assist you in the future.   Screening recommendations/referrals: Colonoscopy: aged out Recommended yearly ophthalmology/optometry visit for glaucoma screening and checkup Recommended yearly dental visit for hygiene and checkup  Vaccinations: Influenza vaccine: 01/09/21  Pneumococcal vaccine: 11/19/13 Tdap vaccine: 12/16/14 Shingles vaccine: Zostavax 03/20/11   Covid-19: 07/18/19, 08/08/19  Advanced directives: no  Conditions/risks identified: none  Next appointment: Follow up in one year for your annual wellness visit. 01/06/23 @ 9:30 am by phone  Preventive Care 81 Years and Older, Male Preventive care refers to lifestyle choices and visits with your health care provider that can promote health and wellness. What does preventive care include? A yearly physical exam. This is also called an annual well check. Dental exams once or twice a year. Routine eye exams. Ask your health care provider how often you should have your eyes checked. Personal lifestyle choices, including: Daily care of your teeth and gums. Regular physical activity. Eating a healthy diet. Avoiding tobacco and drug use. Limiting alcohol use. Practicing safe sex. Taking low doses of aspirin every day. Taking vitamin and mineral supplements as recommended by your health care provider. What happens during an annual well check? The services and screenings done by your health care provider during your annual well check will depend on your age, overall health, lifestyle risk factors, and family history of disease. Counseling  Your health care provider may ask you questions about your: Alcohol use. Tobacco use. Drug use. Emotional well-being. Home and relationship well-being. Sexual activity. Eating  habits. History of falls. Memory and ability to understand (cognition). Work and work Statistician. Screening  You may have the following tests or measurements: Height, weight, and BMI. Blood pressure. Lipid and cholesterol levels. These may be checked every 5 years, or more frequently if you are over 40 years old. Skin check. Lung cancer screening. You may have this screening every year starting at age 81 if you have a 30-pack-year history of smoking and currently smoke or have quit within the past 15 years. Fecal occult blood test (FOBT) of the stool. You may have this test every year starting at age 81. Flexible sigmoidoscopy or colonoscopy. You may have a sigmoidoscopy every 5 years or a colonoscopy every 10 years starting at age 81. Prostate cancer screening. Recommendations will vary depending on your family history and other risks. Hepatitis C blood test. Hepatitis B blood test. Sexually transmitted disease (STD) testing. Diabetes screening. This is done by checking your blood sugar (glucose) after you have not eaten for a while (fasting). You may have this done every 1-3 years. Abdominal aortic aneurysm (AAA) screening. You may need this if you are a current or former smoker. Osteoporosis. You may be screened starting at age 10 if you are at high risk. Talk with your health care provider about your test results, treatment options, and if necessary, the need for more tests. Vaccines  Your health care provider may recommend certain vaccines, such as: Influenza vaccine. This is recommended every year. Tetanus, diphtheria, and acellular pertussis (Tdap, Td) vaccine. You may need a Td booster every 10 years. Zoster vaccine. You may need this after age 81. Pneumococcal 13-valent conjugate (PCV13) vaccine. One dose is recommended after age 81. Pneumococcal polysaccharide (PPSV23) vaccine. One dose is recommended after age 81. Talk to your  health care provider about which screenings and  vaccines you need and how often you need them. This information is not intended to replace advice given to you by your health care provider. Make sure you discuss any questions you have with your health care provider. Document Released: 05/23/2015 Document Revised: 01/14/2016 Document Reviewed: 02/25/2015 Elsevier Interactive Patient Education  2017 [employer] Prevention in the Home Falls can cause injuries. They can happen to people of all ages. There are many things you can do to make your home safe and to help prevent falls. What can I do on the outside of my home? Regularly fix the edges of walkways and driveways and fix any cracks. Remove anything that might make you trip as you walk through a door, such as a raised step or threshold. Trim any bushes or trees on the path to your home. Use bright outdoor lighting. Clear any walking paths of anything that might make someone trip, such as rocks or tools. Regularly check to see if handrails are loose or broken. Make sure that both sides of any steps have handrails. Any raised decks and porches should have guardrails on the edges. Have any leaves, snow, or ice cleared regularly. Use sand or salt on walking paths during winter. Clean up any spills in your garage right away. This includes oil or grease spills. What can I do in the bathroom? Use night lights. Install grab bars by the toilet and in the tub and shower. Do not use towel bars as grab bars. Use non-skid mats or decals in the tub or shower. If you need to sit down in the shower, use a plastic, non-slip stool. Keep the floor dry. Clean up any water that spills on the floor as soon as it happens. Remove soap buildup in the tub or shower regularly. Attach bath mats securely with double-sided non-slip rug tape. Do not have throw rugs and other things on the floor that can make you trip. What can I do in the bedroom? Use night lights. Make sure that you have a light by your  bed that is easy to reach. Do not use any sheets or blankets that are too big for your bed. They should not hang down onto the floor. Have a firm chair that has side arms. You can use this for support while you get dressed. Do not have throw rugs and other things on the floor that can make you trip. What can I do in the kitchen? Clean up any spills right away. Avoid walking on wet floors. Keep items that you use a lot in easy-to-reach places. If you need to reach something above you, use a strong step stool that has a grab bar. Keep electrical cords out of the way. Do not use floor polish or wax that makes floors slippery. If you must use wax, use non-skid floor wax. Do not have throw rugs and other things on the floor that can make you trip. What can I do with my stairs? Do not leave any items on the stairs. Make sure that there are handrails on both sides of the stairs and use them. Fix handrails that are broken or loose. Make sure that handrails are as long as the stairways. Check any carpeting to make sure that it is firmly attached to the stairs. Fix any carpet that is loose or worn. Avoid having throw rugs at the top or bottom of the stairs. If you do have throw rugs, attach them  to the floor with carpet tape. Make sure that you have a light switch at the top of the stairs and the bottom of the stairs. If you do not have them, ask someone to add them for you. What else can I do to help prevent falls? Wear shoes that: Do not have high heels. Have rubber bottoms. Are comfortable and fit you well. Are closed at the toe. Do not wear sandals. If you use a stepladder: Make sure that it is fully opened. Do not climb a closed stepladder. Make sure that both sides of the stepladder are locked into place. Ask someone to hold it for you, if possible. Clearly mark and make sure that you can see: Any grab bars or handrails. First and last steps. Where the edge of each step is. Use tools that  help you move around (mobility aids) if they are needed. These include: Canes. Walkers. Scooters. Crutches. Turn on the lights when you go into a dark area. Replace any light bulbs as soon as they burn out. Set up your furniture so you have a clear path. Avoid moving your furniture around. If any of your floors are uneven, fix them. If there are any pets around you, be aware of where they are. Review your medicines with your doctor. Some medicines can make you feel dizzy. This can increase your chance of falling. Ask your doctor what other things that you can do to help prevent falls. This information is not intended to replace advice given to you by your health care provider. Make sure you discuss any questions you have with your health care provider. Document Released: 02/20/2009 Document Revised: 10/02/2015 Document Reviewed: 05/31/2014 Elsevier Interactive Patient Education  2017 [employer].

## 2022-02-04 ENCOUNTER — Encounter: Payer: Medicare Other | Admitting: Primary Care

## 2022-02-09 ENCOUNTER — Telehealth: Payer: Self-pay | Admitting: *Deleted

## 2022-02-09 NOTE — Patient Outreach (Signed)
  Care Coordination   Initial Visit Note   02/09/2022 Name: Bryan Avila MRN: [id number] DOB: [birth year]  Bryan Avila Gloss is a 81 y.o. year old male who sees Bryan Koch, NP for primary care. I spoke with  Bryan Avila wife by phone today.  What matters to the patients health and wellness today?  No health concerns voiced. RN discussed Stephen, RN, SW, and Pharmacist. Patient declined services.     Goals Addressed             This Visit's Progress    Patient advised to follow up for vaccines          SDOH assessments and interventions completed:  Yes     Care Coordination Interventions Activated:  Yes  Care Coordination Interventions:  Yes, provided   Follow up plan: No further intervention required.   Encounter Outcome:  Pt. Bryan Avila Care Management [phone]

## 2022-06-30 ENCOUNTER — Encounter: Payer: BC Managed Care – PPO | Admitting: Dermatology

## 2022-07-06 ENCOUNTER — Ambulatory Visit (INDEPENDENT_AMBULATORY_CARE_PROVIDER_SITE_OTHER): Payer: Medicare Other | Admitting: Dermatology

## 2022-07-06 ENCOUNTER — Encounter: Payer: Self-pay | Admitting: Dermatology

## 2022-07-06 VITALS — BP 149/77 | HR 66

## 2022-07-06 DIAGNOSIS — L821 Other seborrheic keratosis: Secondary | ICD-10-CM

## 2022-07-06 DIAGNOSIS — Z1283 Encounter for screening for malignant neoplasm of skin: Secondary | ICD-10-CM

## 2022-07-06 DIAGNOSIS — L578 Other skin changes due to chronic exposure to nonionizing radiation: Secondary | ICD-10-CM

## 2022-07-06 DIAGNOSIS — L570 Actinic keratosis: Secondary | ICD-10-CM | POA: Diagnosis not present

## 2022-07-06 DIAGNOSIS — L814 Other melanin hyperpigmentation: Secondary | ICD-10-CM

## 2022-07-06 DIAGNOSIS — Z85828 Personal history of other malignant neoplasm of skin: Secondary | ICD-10-CM

## 2022-07-06 DIAGNOSIS — D229 Melanocytic nevi, unspecified: Secondary | ICD-10-CM

## 2022-07-06 NOTE — Patient Instructions (Addendum)
Cryotherapy Aftercare  Wash gently with soap and water everyday.   Apply Vaseline daily until healed.    Recommend daily broad spectrum sunscreen SPF 30+ to sun-exposed areas, reapply every 2 hours as needed. Call for new or changing lesions.  Staying in the shade or wearing long sleeves, sun glasses (UVA+UVB protection) and wide brim hats (4-inch brim around the entire circumference of the hat) are also recommended for sun protection.    Melanoma ABCDEs  Melanoma is the most dangerous type of skin cancer, and is the leading cause of death from skin disease.  You are more likely to develop melanoma if you: Have light-colored skin, light-colored eyes, or red or blond hair Spend a lot of time in the sun Tan regularly, either outdoors or in a tanning bed Have had blistering sunburns, especially during childhood Have a close family member who has had a melanoma Have atypical moles or large birthmarks  Early detection of melanoma is key since treatment is typically straightforward and cure rates are extremely high if we catch it early.   The first sign of melanoma is often a change in a mole or a new dark spot.  The ABCDE system is a way of remembering the signs of melanoma.  A for asymmetry:  The two halves do not match. B for border:  The edges of the growth are irregular. C for color:  A mixture of colors are present instead of an even brown color. D for diameter:  Melanomas are usually (but not always) greater than 93m - the size of a pencil eraser. E for evolution:  The spot keeps changing in size, shape, and color.  Please check your skin once per month between visits. You can use a small mirror in front and a large mirror behind you to keep an eye on the back side or your body.   If you see any new or changing lesions before your next follow-up, please call to schedule a visit.  Please continue daily skin protection including broad spectrum sunscreen SPF 30+ to sun-exposed areas,  reapplying every 2 hours as needed when you're outdoors.   Staying in the shade or wearing long sleeves, sun glasses (UVA+UVB protection) and wide brim hats (4-inch brim around the entire circumference of the hat) are also recommended for sun protection.    Due to recent changes in healthcare laws, you may see results of your pathology and/or laboratory studies on MyChart before the doctors have had a chance to review them. We understand that in some cases there may be results that are confusing or concerning to you. Please understand that not all results are received at the same time and often the doctors may need to interpret multiple results in order to provide you with the best plan of care or course of treatment. Therefore, we ask that you please give u[place]2 business days to thoroughly review all your results before contacting the office for clarification. Should we see a critical lab result, you will be contacted sooner.   If You Need Anything After Your Visit  If you have any questions or concerns for your doctor, please call our main line at [phone] press option 4 to reach your doctor's medical assistant. If no one answers, please leave a voicemail as directed and we will return your call as soon as possible. Messages left after 4 pm will be answered the following business day.   You may also send [place] message via [place] We  typically respond to MyChart messages within 1-2 business days.  For prescription refills, please ask your pharmacy to contact our office. Our fax number is [phone].  If you have an urgent issue when the clinic is closed that cannot wait until the next business day, you can [place] your doctor at the number below.    Please note that while we do our best to be available for urgent issues outside of office hours, we are not available 24/7.   If you have an urgent issue and are unable to reach [place], you may choose to seek medical care at your doctor's office,  retail clinic, urgent care center, or emergency room.  If you have a medical emergency, please immediately call 911 or go to the emergency department.  Pager Numbers  - Dr. Nehemiah Massed: [phone]  - Dr. Laurence Ferrari: [phone]  - Dr. Nicole Kindred: [phone]  In the event of inclement weather, please call our main line at [phone] for an update on the status of any delays or closures.  Dermatology Medication Tips: Please keep the boxes that topical medications come in in order to help keep track of the instructions about where and how to use these. Pharmacies typically print the medication instructions only on the boxes and not directly on the medication tubes.   If your medication is too expensive, please contact our office at [phone] option 4 or send [place] a message through [place].   We are unable to tell what your co-pay for medications will be in advance as this is different depending on your insurance coverage. However, we may be able to find a substitute medication at lower cost or fill out paperwork to get insurance to cover a needed medication.   If a prior authorization is required to get your medication covered by your insurance company, please allow [place] 1-2 business days to complete this process.  Drug prices often vary depending on where the prescription is filled and some pharmacies may offer cheaper prices.  The website www.goodrx.com contains coupons for medications through different pharmacies. The prices here do not account for what the cost may be with help from insurance (it may be cheaper with your insurance), but the website can give you the price if you did not use any insurance.  - You can print the associated coupon and take it with your prescription to the pharmacy.  - You may also stop by our office during regular business hours and pick up a GoodRx coupon card.  - If you need your prescription sent electronically to a different pharmacy, notify our office through  [facility] or by phone at [phone] option 4.     Si Usted Necesita Algo Despus de Su Visita  Tambin puede enviarnos un mensaje a travs de Pharmacist, community. Por lo general respondemos a los mensajes de MyChart en el transcurso de 1 a 2 das hbiles.  Para renovar recetas, por favor pida a su farmacia que se ponga en contacto con nuestra oficina. Harland Dingwall de fax es [place] [phone].  Si tiene un asunto urgente cuando la clnica est cerrada y que no puede esperar hasta el siguiente da hbil, puede llamar/localizar a su doctor(a) al nmero que aparece a continuacin.   Por favor, tenga en cuenta que aunque hacemos todo lo posible para estar disponibles para asuntos urgentes fuera del horario de [place], no estamos disponibles las 24 horas del da, los 7 das de la [place].   Si tiene un problema urgente y no puede comunicarse  con nosotros, puede optar por buscar atencin mdica  en el consultorio de su doctor(a), en una clnica privada, en un centro de atencin urgente o en una sala de emergencias.  Si tiene Engineering geologist, por favor llame inmediatamente al 911 o vaya a la sala de emergencias.  Nmeros de bper  - Dr. Nehemiah Massed: [phone]  - Dra. Moye: [phone]  - Dra. Nicole Kindred: [phone]  En caso de inclemencias del [place], por favor llame a Johnsie Kindred principal al [phone] para una actualizacin sobre el [place] de cualquier retraso o cierre.  Consejos para la medicacin en dermatologa: Por favor, guarde las cajas en las que vienen los medicamentos de uso tpico para ayudarle a seguir las instrucciones sobre dnde y cmo usarlos. Las farmacias generalmente imprimen las instrucciones del medicamento slo en las cajas y no directamente en los tubos del [place].   Si su medicamento es muy caro, por favor, pngase en contacto con Zigmund Daniel llamando al [phone] y presione la opcin 4 o envenos un mensaje a travs de Pharmacist, community.   No podemos  decirle cul ser su copago por los medicamentos por adelantado ya que esto es diferente dependiendo de la cobertura de su seguro. Sin embargo, es posible que podamos encontrar un medicamento sustituto a Electrical engineer un formulario para que el seguro cubra el medicamento que se considera necesario.   Si se requiere una autorizacin previa para que su compaa de seguros [place] su medicamento, por favor permtanos de 1 a 2 das hbiles para completar este proceso.  Los precios de los medicamentos varan con frecuencia dependiendo del Environmental consultant de dnde se surte la receta y alguna farmacias pueden ofrecer precios ms baratos.  El sitio web www.goodrx.com tiene cupones para medicamentos de Airline pilot. Los precios aqu no tienen en cuenta lo que podra costar con la ayuda del seguro (puede ser ms barato con su seguro), pero el sitio web puede darle el precio si no utiliz Research scientist (physical sciences).  - Puede imprimir el cupn correspondiente y llevarlo con su receta a la farmacia.  - Tambin puede pasar por nuestra oficina durante el horario de atencin regular y Charity fundraiser una tarjeta de cupones de GoodRx.  - Si necesita que su receta se enve electrnicamente a una farmacia diferente, informe a nuestra oficina a travs de MyChart de [place] o por telfono llamando al [phone] y presione la opcin 4.

## 2022-07-06 NOTE — Progress Notes (Signed)
Follow-Up Visit   Subjective  Bryan Avila is a 82 y.o. male who presents for the following: Annual Exam (Hx of BCC's. Lesions on scalp).  The patient presents for Upper Body Skin Exam (UBSE) for skin cancer screening and mole check.  The patient has spots, moles and lesions to be evaluated, some may be new or changing and the patient has concerns that these could be cancer.  Patient accompanied by wife, [name].   The following portions of the chart were reviewed this encounter and updated as appropriate:  Tobacco  Allergies  Meds  Problems  Med Hx  Surg Hx  Fam Hx      Review of Systems: No other skin or systemic complaints except as noted in HPI or Assessment and Plan.   Objective  Well appearing patient in no apparent distress; mood and affect are within normal limits.  All skin waist up examined.  Right Forearm dorsal x1, left dorsal forearm x1, left elbow x1, right helix x1, right cheek x1, left frontal scalp x1, L lat canthus x1, vertex scalp x1 (8) Erythematous thin papules/macules with gritty scale.    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 Right nasal tip, 2021. - No evidence of recurrence today - Recommend regular full body skin exams - Recommend daily broad spectrum sunscreen SPF 30+ to sun-exposed areas, reapply every 2 hours as needed.  - Call if any new or changing lesions are noted between office visits   Lentigines - Scattered tan macules - Due to sun exposure - Benign-appearing, observe - Recommend daily broad spectrum sunscreen SPF 30+ to sun-exposed areas, reapply every 2 hours as needed. - Call for any changes  Seborrheic Keratoses - Stuck-on, waxy, tan-brown papules and/or plaques  - Benign-appearing - Discussed benign etiology and prognosis. - Observe - Call for any changes  Melanocytic Nevi - Tan-brown and/or pink-flesh-colored symmetric macules and papules - Benign appearing on  exam today - Observation - Call clinic for new or changing moles - Recommend daily use of broad spectrum spf 30+ sunscreen to sun-exposed areas.   Hemangiomas - Red papules - Discussed benign nature - Observe - Call for any changes  Actinic Damage - Chronic condition, secondary to cumulative UV/sun exposure - diffuse scaly erythematous macules with underlying dyspigmentation - Recommend daily broad spectrum sunscreen SPF 30+ to sun-exposed areas, reapply every 2 hours as needed.  - Staying in the shade or wearing long sleeves, sun glasses (UVA+UVB protection) and wide brim hats (4-inch brim around the entire circumference of the hat) are also recommended for sun protection.  - Call for new or changing lesions.  Skin cancer screening performed today.   AK (actinic keratosis) (8) Right Forearm dorsal x1, left dorsal forearm x1, left elbow x1, right helix x1, right cheek x1, left frontal scalp x1, L lat canthus x1, vertex scalp x1  Actinic keratoses are precancerous spots that appear secondary to cumulative UV radiation exposure/sun exposure over time. They are chronic with expected duration over 1 year. A portion of actinic keratoses will progress to squamous cell carcinoma of the skin. It is not possible to reliably predict which spots will progress to skin cancer and so treatment is recommended to prevent development of skin cancer.  Recommend daily broad spectrum sunscreen SPF 30+ to sun-exposed areas, reapply every 2 hours as needed.  Recommend staying in the shade or wearing long sleeves, sun glasses (UVA+UVB protection) and wide brim hats (  4-inch brim around the entire circumference of the hat). Call for new or changing lesions.  Destruction of lesion - Right Forearm dorsal x1, left dorsal forearm x1, left elbow x1, right helix x1, right cheek x1, left frontal scalp x1, L lat canthus x1, vertex scalp x1  Destruction method: cryotherapy   Informed consent: discussed and consent  obtained   Lesion destroyed using liquid nitrogen: Yes   Region frozen until ice ball extended beyond lesion: Yes   Outcome: patient tolerated procedure well with no complications   Post-procedure details: wound care instructions given   Additional details:  Prior to procedure, discussed risks of blister formation, small wound, skin dyspigmentation, or rare scar following cryotherapy. Recommend Vaseline ointment to treated areas while healing.    Return in about 6 months (around 01/04/2023) for TBSE, HxBCC's, AK Follow Up.  I, Emelia Salisbury, CMA, am acting as scribe for Forest Gleason, MD.  Documentation: I have reviewed the above documentation for accuracy and completeness, and I agree with the above.  Forest Gleason, MD

## 2022-11-15 IMAGING — CR DG CHEST 2V
2 series · 2 of 2 positions shown · non-contrast
Comparison: 07/06/2018

CLINICAL DATA: Dizziness

EXAM:
CHEST - 2 VIEW

[chest pa]
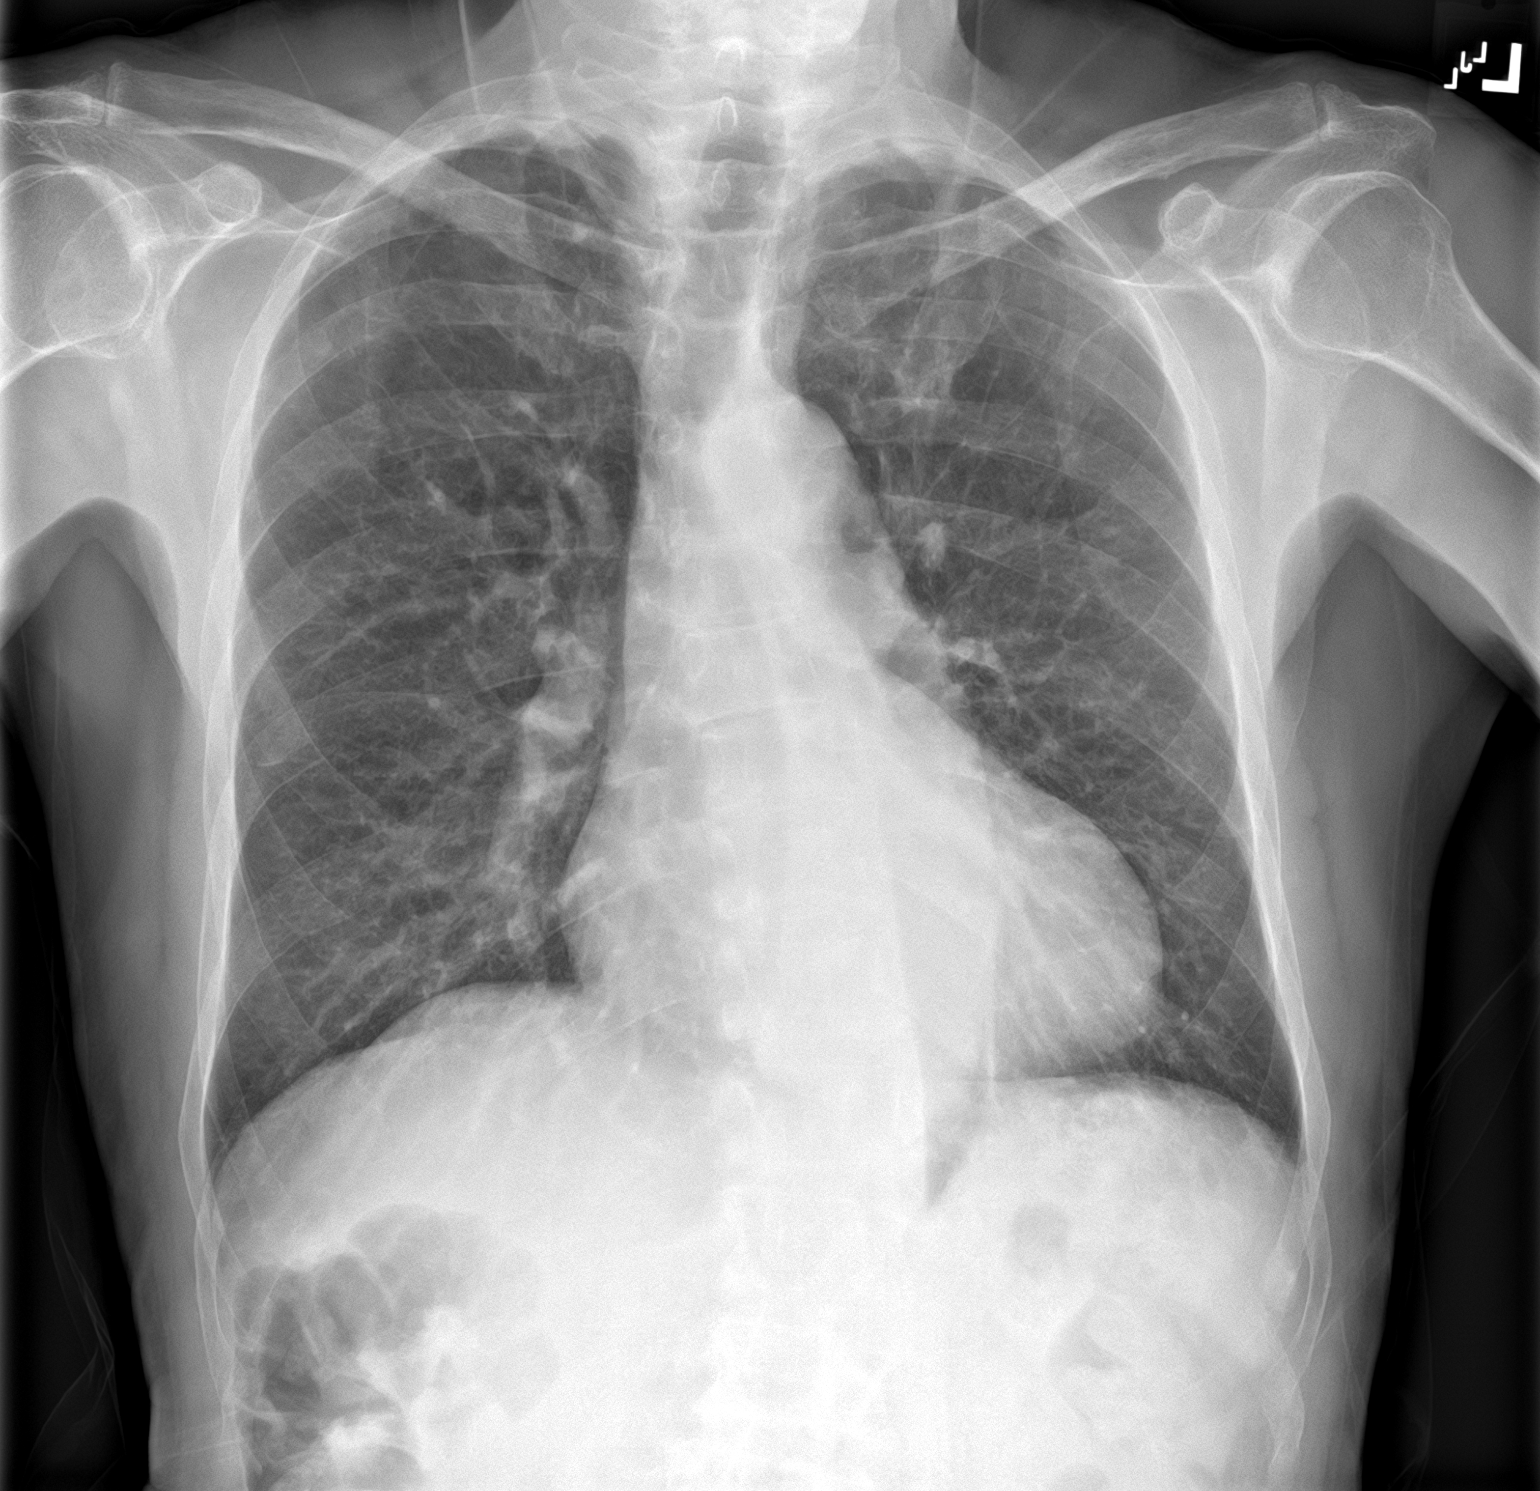

[chest lat]
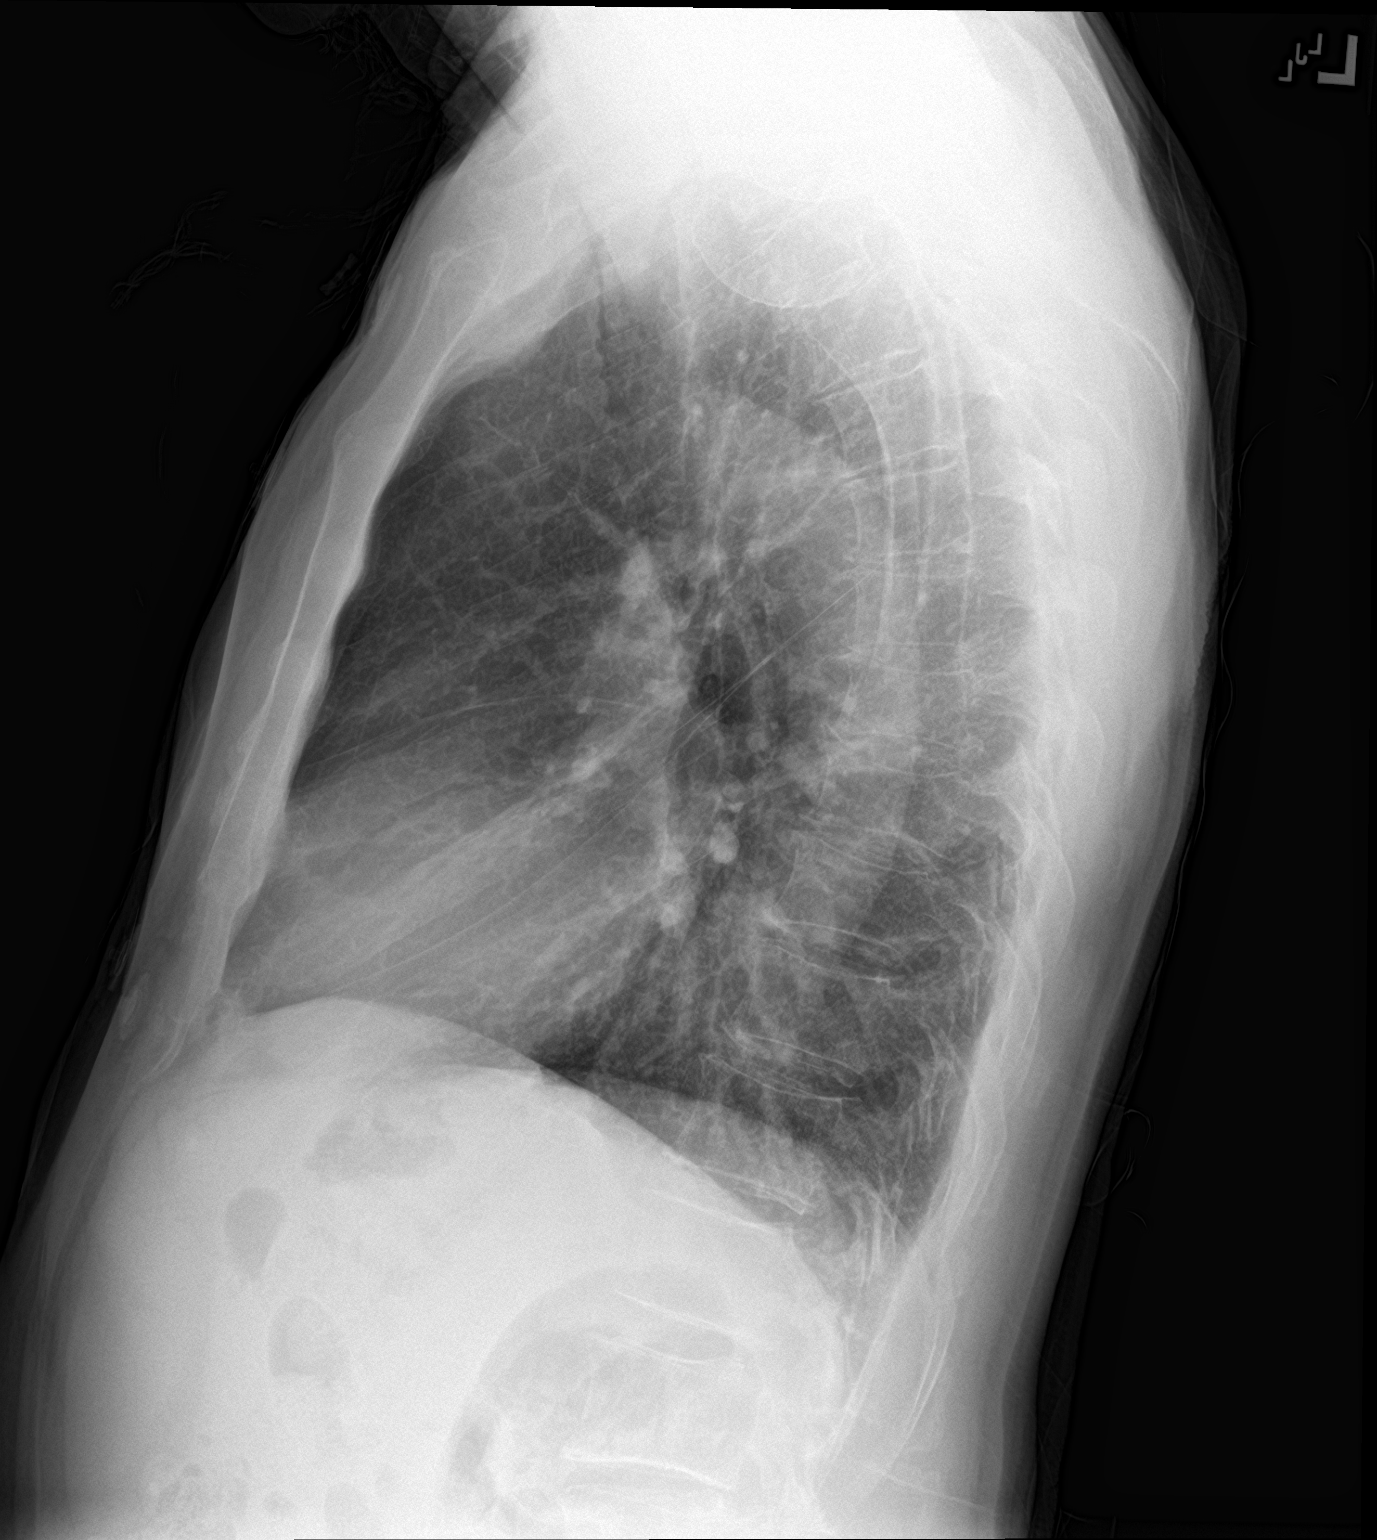

[2 of 2 positions shown; findings below may reference images not displayed]

FINDINGS: The heart size and mediastinal contours are within normal limits. No
focal airspace consolidation, pleural effusion, or pneumothorax. The
visualized skeletal structures are unremarkable.
IMPRESSION: No active cardiopulmonary disease.

## 2022-12-08 ENCOUNTER — Encounter (INDEPENDENT_AMBULATORY_CARE_PROVIDER_SITE_OTHER): Payer: Self-pay

## 2023-01-05 ENCOUNTER — Ambulatory Visit: Payer: Medicare Other | Admitting: Dermatology

## 2023-01-06 ENCOUNTER — Ambulatory Visit: Payer: Medicare Other

## 2023-01-06 VITALS — Ht 66.5 in | Wt 146.0 lb

## 2023-01-06 DIAGNOSIS — Z0000 Encounter for general adult medical examination without abnormal findings: Secondary | ICD-10-CM

## 2023-01-06 NOTE — Progress Notes (Signed)
Subjective:   Bryan Avila is a 82 y.o. male who presents for Medicare Annual/Subsequent preventive examination.  Visit Complete: Virtual  I connected with  [name] on 01/06/23 by a audio enabled telemedicine application and verified that I am speaking with the correct person using two identifiers.  Patient Location: Home  Provider Location: Home Office  I discussed the limitations of evaluation and management by telemedicine. The patient expressed understanding and agreed to proceed.  Patient Medicare AWV questionnaire was completed by the patient on 01/01/23; I have confirmed that all information answered by patient is correct and no changes since this date.  Vital Signs: Because this visit was a virtual/telehealth visit, some criteria may be missing or patient reported. Any vitals not documented were not able to be obtained and vitals that have been documented are patient reported.    Review of Systems      Cardiac Risk Factors include: advanced age (>16men, >47 women);male gender     Objective:    Today's Vitals   01/06/23 0909  Weight: 146 lb (66.2 kg)  Height: 5' 6.5" (1.689 m)   Body mass index is 23.21 kg/m.     01/06/2023    9:15 AM 01/04/2022    9:28 AM 12/06/2017    8:58 AM 11/25/2016   12:18 PM 12/16/2014    7:15 PM  Advanced Directives  Does Patient Have a Medical Advance Directive? No No No No No  Does patient want to make changes to medical advance directive?    Yes (MAU/Ambulatory/Procedural Areas - Information given)   Would patient like information on creating a medical advance directive? No - Patient declined No - Patient declined No - Patient declined      Current Medications (verified) Outpatient Encounter Medications as of 01/06/2023  Medication Sig   acyclovir (ZOVIRAX) 400 MG tablet Take 400 mg by mouth 2 (two) times daily.   Multiple Vitamins-Minerals (PRESERVISION AREDS PO) Take by mouth 2 (two) times daily.   timolol (TIMOPTIC) 0.5 %  ophthalmic solution Place 1 drop into the right eye daily.    benzonatate (TESSALON) 100 MG capsule Take 1 capsule (100 mg total) by mouth every 8 (eight) hours. (Patient not taking: Reported on 01/01/2022)   fluticasone (FLONASE) 50 MCG/ACT nasal spray Place 2 sprays into both nostrils daily. (Patient not taking: Reported on 01/01/2022)   No facility-administered encounter medications on file as of 01/06/2023.    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   left vertex scalp   Basal cell carcinoma 05/17/2019   right nasal tip/Moh's   Cataract 03/2009   left eye   Hyperlipidemia    Osteoporosis    osteoarthritis   Past Surgical History:  Procedure Laterality Date   EYE SURGERY     thumb surgery  1999   right   Family History  Problem Relation Age of Onset   Arthritis Father    Sleep apnea Father    Alcohol abuse Brother    Prostate cancer Brother    Cancer Paternal Grandfather        prostate   Social History   Socioeconomic History   Marital status: Married    Spouse name: Not on file   Number of children: 1   Years of education: Not on file   Highest education level: Not on file  Occupational History   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   Would not want Tube Feeds.   Does not have a living will or HPOA.   Social Determinants of Health   Financial Resource Strain: Low Risk  (01/01/2023)   Overall Financial Resource Strain (CARDIA)    Difficulty of Paying Living Expenses: Not hard at all  Food Insecurity:  No Food Insecurity (01/01/2023)   Hunger Vital Sign    Worried About Running Out of Food in the Last Year: Never true    Ran Out of Food in the Last Year: Never true  Transportation Needs: No Transportation Needs (01/01/2023)   PRAPARE - Administrator, Civil Service (Medical): No    Lack of Transportation (Non-Medical): No  Physical Activity: Sufficiently Active (01/01/2023)   Exercise Vital Sign    Days of Exercise per Week: 6 days    Minutes of Exercise per Session: 30 min  Stress: No Stress Concern Present (01/01/2023)   [name] of Occupational Health - Occupational Stress Questionnaire    Feeling of Stress : Not at all  Social Connections: Socially Integrated (01/01/2023)   Social Connection and Isolation Panel [NHANES]    Frequency of Communication with Friends and Family: Twice a week    Frequency of Social Gatherings with Friends and Family: Twice a week    Attends Religious Services: More than 4 times per year    Active Member of [employer] or Organizations: Yes    Attends Banker Meetings: Never    Marital Status: Married    Tobacco Counseling Counseling given: Not Answered   Clinical Intake:  Pre-visit preparation completed: Yes  Pain : No/denies pain     BMI - recorded: 23.21 Nutritional Status: BMI of 19-24  Normal Nutritional Risks: None Diabetes: No  How often do you need to have someone help you when you read instructions, pamphlets, or other written materials from your doctor or pharmacy?: 1 - Never  Interpreter Needed?: No  Information entered by :: C.Lenola Lockner LPN   Activities of Daily Living    01/01/2023   10:30 AM  In your present state of health, do you have any difficulty performing the following activities:  Hearing? 1  Comment no hearing aids  Vision? 0  Difficulty concentrating or making decisions? 0  Walking or climbing stairs? 0  Dressing or bathing? 0  Doing errands, shopping? 0  Preparing Food and eating  ? N  Using the Toilet? N  In the past six months, have you accidently leaked urine? N  Do you have problems with loss of bowel control? N  Managing your Medications? N  Managing your Finances? N  Housekeeping or managing your Housekeeping? N    Patient Care Team: Doreene Nest, NP as PCP - General (Internal Medicine) Antonieta Iba, MD as Consulting Physician (Cardiology) Galen Manila, MD as Referring Physician (Ophthalmology) Neale Burly, [place], MD (Inactive) as Consulting Physician (Dermatology)  Indicate any recent Medical Services you may have received from other than [facility] providers in the past year (date may be approximate).     Assessment:   This is  a routine wellness examination for [place].  Hearing/Vision screen Hearing Screening - Comments:: Denies hearing difficulties   Vision Screening - Comments:: Glasses - [facility] -UTD on eye exams  Dietary issues and exercise activities discussed:     Goals Addressed             This Visit's Progress    Patient Stated       Maintain current health status.       Depression Screen    01/06/2023    9:11 AM 01/04/2022    9:27 AM 12/30/2020    9:24 AM 12/19/2018    9:08 AM 12/06/2017    8:57 AM 11/25/2016   12:18 PM 11/25/2015   12:07 PM  PHQ 2/9 Scores  PHQ - 2 Score 0 0 0 0 0 0 0  PHQ- 9 Score  0 0  0      Fall Risk    01/01/2023   10:30 AM 01/04/2022    9:29 AM 12/30/2021    6:26 PM 12/30/2020    9:45 AM 12/30/2020    9:21 AM  Fall Risk   Falls in the past year? 0 0 0 0 0  Number falls in past yr: 0 0  0 0  Injury with Fall? 0 0  0 0  Risk for fall due to : No Fall Risks No Fall Risks   History of fall(s)  Follow up Falls prevention discussed;Falls evaluation completed Falls evaluation completed       MEDICARE RISK AT HOME: Medicare Risk at Home Any stairs in or around the home?: Yes If so, are there any without handrails?: No Home free of loose throw rugs in walkways, pet beds, electrical  cords, etc?: Yes Adequate lighting in your home to reduce risk of falls?: Yes Life alert?: No Use of a cane, walker or w/c?: No Grab bars in the bathroom?: No Shower chair or bench in shower?: Yes Elevated toilet seat or a handicapped toilet?: No  TIMED UP AND GO:  Was the test performed?  No    Cognitive Function:    12/06/2017    8:58 AM 11/25/2016   12:20 PM  MMSE - Mini Mental State Exam  Orientation to time 5 5  Orientation to Place 5 5  Registration 3 3  Attention/ Calculation 0 0  Recall 3 3  Language- name 2 objects 0 0  Language- repeat 1 1  Language- follow 3 step command 3 3  Language- read & follow direction 0 0  Write a sentence 0 0  Copy design 0 0  Total score 20 20        01/06/2023    9:17 AM 01/04/2022    9:30 AM  6CIT Screen  What Year? 0 points 0 points  What month? 0 points 0 points  What time? 0 points 0 points  Count back from 20 0 points 0 points  Months in reverse 0 points 0 points  Repeat phrase 2 points 0 points  Total Score 2 points 0 points    Immunizations Immunization History  Administered Date(s) Administered   Fluad Quad(high Dose 65+) 01/09/2021   Influenza Whole 04/09/2000, 03/10/2007, 02/07/2008, 02/12/2009, 02/10/2010   Influenza, High Dose Seasonal PF 01/28/2015, 01/24/2016, 02/04/2017, 02/07/2018   Influenza-Unspecified 02/04/2017   PFIZER(Purple Top)SARS-COV-2 Vaccination 07/18/2019, 08/08/2019   Pneumococcal Conjugate-13 11/19/2013   Pneumococcal Polysaccharide-23 03/31/2007   Td 05/10/1996, 10/28/2006   Tdap 12/16/2014   Zoster, Live 03/25/2010    TDAP status: Up to  date  Flu Vaccine status: Due, Education has been provided regarding the importance of this vaccine. Advised may receive this vaccine at local pharmacy or Health Dept. Aware to provide a copy of the vaccination record if obtained from local pharmacy or Health Dept. Verbalized acceptance and understanding.  Pneumococcal vaccine status: Up to  date  Covid-19 vaccine status: Information provided on how to obtain vaccines.   Qualifies for Shingles Vaccine? Yes   Zostavax completed Yes   Shingrix Completed?: No.    Education has been provided regarding the importance of this vaccine. Patient has been advised to call insurance company to determine out of pocket expense if they have not yet received this vaccine. Advised may also receive vaccine at local pharmacy or Health Dept. Verbalized acceptance and understanding.  Screening Tests Health Maintenance  Topic Date Due   Zoster Vaccines- Shingrix (1 of 2) 01/14/1960   INFLUENZA VACCINE  12/09/2022   Medicare Annual Wellness (AWV)  01/06/2024   DTaP/Tdap/Td (4 - Td or Tdap) 12/15/2024   Pneumonia Vaccine 30+ Years old  Completed   HPV VACCINES  Aged Out   COVID-19 Vaccine  Discontinued    Health Maintenance  Health Maintenance Due  Topic Date Due   Zoster Vaccines- Shingrix (1 of 2) 01/14/1960   INFLUENZA VACCINE  12/09/2022    Colorectal cancer screening: No longer required.   Lung Cancer Screening: (Low Dose CT Chest recommended if Age 35-80 years, 20 pack-year currently smoking OR have quit w/in 15years.) does not qualify.   Lung Cancer Screening Referral:    Additional Screening:  Hepatitis C Screening: does not qualify;   Vision Screening: Recommended annual ophthalmology exams for early detection of glaucoma and other disorders of the eye. Is the patient up to date with their annual eye exam?  Yes  Who is the provider or what is the name of the office in which the patient attends annual eye exams? [facility] If pt is not established with a provider, would they like to be referred to a provider to establish care? Yes .   Dental Screening: Recommended annual dental exams for proper oral hygiene  Diabetic Foot Exam:   Community Resource Referral / Chronic Care Management: CRR required this visit?  No   CCM required this visit?  No     Plan:     I  have personally reviewed and noted the following in the patient's chart:   Medical and social history Use of alcohol, tobacco or illicit drugs  Current medications and supplements including opioid prescriptions. Patient is not currently taking opioid prescriptions. Functional ability and status Nutritional status Physical activity Advanced directives List of other physicians Hospitalizations, surgeries, and ER visits in previous 12 months Vitals Screenings to include cognitive, depression, and falls Referrals and appointments  In addition, I have reviewed and discussed with patient certain preventive protocols, quality metrics, and best practice recommendations. A written personalized care plan for preventive services as well as general preventive health recommendations were provided to patient.     Maryan Puls, LPN   10/12/5407   After Visit Summary: (MyChart) Due to this being a telephonic visit, the after visit summary with patients personalized plan was offered to patient via MyChart   Nurse Notes: none

## 2023-01-06 NOTE — Patient Instructions (Signed)
Bryan Avila , Thank you for taking time to come for your Medicare Wellness Visit. I appreciate your ongoing commitment to your health goals. Please review the following plan we discussed and let me know if I can assist you in the future.   Referrals/Orders/Follow-Ups/Clinician Recommendations: Aim for 30 minutes of exercise or brisk walking, 6-8 glasses of water, and 5 servings of fruits and vegetables each day.   This is a list of the screening recommended for you and due dates:  Health Maintenance  Topic Date Due   Zoster (Shingles) Vaccine (1 of 2) 01/14/1960   Medicare Annual Wellness Visit  01/05/2023   Flu Shot  12/09/2022   DTaP/Tdap/Td vaccine (4 - Td or Tdap) 12/15/2024   Pneumonia Vaccine  Completed   HPV Vaccine  Aged Out   COVID-19 Vaccine  Discontinued    Advanced directives: (Declined) Advance directive discussed with you today. Even though you declined this today, please call our office should you change your mind, and we can give you the proper paperwork for you to fill out.  Next Medicare Annual Wellness Visit scheduled for next year: Yes

## 2023-01-13 ENCOUNTER — Encounter: Payer: Self-pay | Admitting: Primary Care

## 2023-01-13 ENCOUNTER — Ambulatory Visit (INDEPENDENT_AMBULATORY_CARE_PROVIDER_SITE_OTHER): Payer: Medicare Other | Admitting: Primary Care

## 2023-01-13 VITALS — BP 128/74 | HR 62 | Temp 98.1°F | Ht 66.5 in | Wt 147.0 lb

## 2023-01-13 DIAGNOSIS — H3530 Unspecified macular degeneration: Secondary | ICD-10-CM

## 2023-01-13 DIAGNOSIS — M1990 Unspecified osteoarthritis, unspecified site: Secondary | ICD-10-CM | POA: Diagnosis not present

## 2023-01-13 DIAGNOSIS — B009 Herpesviral infection, unspecified: Secondary | ICD-10-CM

## 2023-01-13 DIAGNOSIS — Z23 Encounter for immunization: Secondary | ICD-10-CM | POA: Diagnosis not present

## 2023-01-13 DIAGNOSIS — E7800 Pure hypercholesterolemia, unspecified: Secondary | ICD-10-CM | POA: Diagnosis not present

## 2023-01-13 DIAGNOSIS — Z125 Encounter for screening for malignant neoplasm of prostate: Secondary | ICD-10-CM | POA: Diagnosis not present

## 2023-01-13 LAB — CBC
HCT: 43.6 % (ref 39.0–52.0)
Hemoglobin: 14.1 g/dL (ref 13.0–17.0)
MCHC: 32.4 g/dL (ref 30.0–36.0)
MCV: 94.5 fl (ref 78.0–100.0)
Platelets: 291 10*3/uL (ref 150.0–400.0)
RBC: 4.61 Mil/uL (ref 4.22–5.81)
RDW: 14 % (ref 11.5–15.5)
WBC: 6.9 10*3/uL (ref 4.0–10.5)

## 2023-01-13 LAB — COMPREHENSIVE METABOLIC PANEL
ALT: 11 U/L (ref 0–53)
AST: 15 U/L (ref 0–37)
Albumin: 3.7 g/dL (ref 3.5–5.2)
Alkaline Phosphatase: 71 U/L (ref 39–117)
BUN: 16 mg/dL (ref 6–23)
CO2: 31 meq/L (ref 19–32)
Calcium: 9.6 mg/dL (ref 8.4–10.5)
Chloride: 103 meq/L (ref 96–112)
Creatinine, Ser: 1.02 mg/dL (ref 0.40–1.50)
GFR: 68.69 mL/min (ref >60.00)
Glucose, Bld: 94 mg/dL (ref 70–99)
Potassium: 4.2 meq/L (ref 3.5–5.1)
Sodium: 141 meq/L (ref 135–145)
Total Bilirubin: 0.6 mg/dL (ref 0.2–1.2)
Total Protein: 6.5 g/dL (ref 6.0–8.3)

## 2023-01-13 LAB — LIPID PANEL
Cholesterol: 181 mg/dL (ref 0–200)
HDL: 70 mg/dL (ref >39.00)
LDL Cholesterol: 96 mg/dL (ref 0–99)
NonHDL: 111.35
Total CHOL/HDL Ratio: 3
Triglycerides: 79 mg/dL (ref 0.0–149.0)
VLDL: 15.8 mg/dL (ref 0.0–40.0)

## 2023-01-13 LAB — PSA, MEDICARE: PSA: 1 ng/mL (ref 0.10–4.00)

## 2023-01-13 NOTE — Assessment & Plan Note (Signed)
Follow with ophthalmology.  Continue eye injections and timolol 0.5% drops.

## 2023-01-13 NOTE — Assessment & Plan Note (Signed)
Stable  No concerns today. Continue regular exercise and activity.

## 2023-01-13 NOTE — Progress Notes (Signed)
Subjective:    Patient ID: Bryan Avila, male    DOB: 1940-12-25, 82 y.o.   MRN: [id number]  HPI  Bryan Avila is a very pleasant 82 y.o. male with a history of right bundle branch block, osteoarthritis, herpes simplex, hyperlipidemia, chronic cough, macular degeneration who presents today for follow-up of chronic conditions.  1) Herpes Simplex/Macular Degeneration: Currently managed on acyclovir 400 mg twice daily. Following with ophthalmology. Scheduled for later this month. Completes injections and Timolol solution.   2) Osteoarthritis: Chronic, stable. He manages with exercise and walking. He does not take Tylenol or Ibuprofen.   3) Hyperlipidemia: Currently managed on treatment.  Historically, lipids have been controlled on diet alone. He denies chest pain, dizziness, headaches.   BP Readings from Last 3 Encounters:  01/13/23 128/74  07/06/22 (!) 149/77  01/01/22 128/74      Review of Systems  Respiratory:  Negative for shortness of breath.   Cardiovascular:  Negative for chest pain.  Gastrointestinal:  Negative for constipation and diarrhea.  Neurological:  Negative for dizziness and headaches.  Psychiatric/Behavioral:  The patient is not nervous/anxious.          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/24/2016    Social History   Socioeconomic History   Marital status: Married    Spouse name: Not on file   Number of children: 1   Years of education: Not on file   Highest education level: Not on file  Occupational History   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   Would not want Tube Feeds.   Does not have a living will or HPOA.   Social Determinants of Health   Financial Resource Strain: Low Risk  (01/01/2023)   Overall Financial Resource Strain (CARDIA)    Difficulty of Paying Living Expenses: Not hard at all  Food Insecurity: No Food Insecurity (01/01/2023)   Hunger Vital Sign    Worried About Running Out of Food in the Last Year: Never true    Ran Out of Food in the Last Year: Never true  Transportation Needs: No Transportation Needs (01/01/2023)   PRAPARE - Administrator, Civil Service (Medical): No    Lack of Transportation (Non-Medical): No  Physical Activity: Sufficiently Active (01/01/2023)   Exercise Vital Sign    Days of Exercise per Week: 6 days    Minutes of Exercise per Session: 30 min  Stress: No Stress Concern Present (01/01/2023)   [name] of Occupational Health - Occupational Stress Questionnaire    Feeling of Stress : Not at all  Social Connections: Socially Integrated (01/01/2023)   Social Connection and Isolation Panel [NHANES]  Frequency of Communication with Friends and Family: Twice a week    Frequency of Social Gatherings with Friends and Family: Twice a week    Attends Religious Services: More than 4 times per year    Active Member of [employer] or Organizations: Yes    Attends Banker Meetings: Never    Marital Status: Married  Catering manager Violence: Not At Risk (01/04/2022)   Humiliation, Afraid, Rape, and Kick questionnaire    Fear of Current or Ex-Partner: No    Emotionally Abused: No    Physically Abused: No    Sexually Abused: No    Past Surgical History:   Procedure Laterality Date   EYE SURGERY     thumb surgery  1999   right    Family History  Problem Relation Age of Onset   Arthritis Father    Sleep apnea Father    Alcohol abuse Brother    Prostate cancer Brother    Cancer Paternal Grandfather        prostate    Allergies  Allergen Reactions   Meperidine Other (See Comments) and Nausea Only    "I almost died from that."-Severe bradycardia Other reaction(s): Unknown DECREASED BLOOD PRESSURE "I almost died from that."-Severe bradycardia     Current Outpatient Medications on File Prior to Visit  Medication Sig Dispense Refill   acyclovir (ZOVIRAX) 400 MG tablet Take 400 mg by mouth 2 (two) times daily.  99   Multiple Vitamins-Minerals (PRESERVISION AREDS PO) Take by mouth 2 (two) times daily.     timolol (TIMOPTIC) 0.5 % ophthalmic solution Place 1 drop into the right eye daily.      benzonatate (TESSALON) 100 MG capsule Take 1 capsule (100 mg total) by mouth every 8 (eight) hours. (Patient not taking: Reported on 01/01/2022) 21 capsule 0   fluticasone (FLONASE) 50 MCG/ACT nasal spray Place 2 sprays into both nostrils daily. (Patient not taking: Reported on 01/01/2022) 16 mL 0   No current facility-administered medications on file prior to visit.    BP 128/74   Pulse 62   Temp 98.1 F (36.7 C) (Temporal)   Ht 5' 6.5" (1.689 m)   Wt 147 lb (66.7 kg)   SpO2 98%   BMI 23.37 kg/m  Objective:   Physical Exam Cardiovascular:     Rate and Rhythm: Normal rate and regular rhythm.  Pulmonary:     Effort: Pulmonary effort is normal.     Breath sounds: Normal breath sounds. No wheezing or rales.  Musculoskeletal:     Cervical back: Neck supple.  Skin:    General: Skin is warm and dry.  Neurological:     Mental Status: He is alert and oriented to person, place, and time.  Psychiatric:        Mood and Affect: Mood normal.           Assessment & Plan:  Pure hypercholesterolemia Assessment & Plan: Commended him on  regular activity and exercise. Repeat lipid panel pending.  Remain off treatment.  Orders: -     Lipid panel -     Comprehensive metabolic panel -     CBC  Screening for prostate cancer -     PSA, Medicare  Osteoarthritis, unspecified osteoarthritis type, unspecified site Assessment & Plan: Stable  No concerns today. Continue regular exercise and activity.   Herpes simplex Assessment & Plan: Follow with ophthalmology.  Continue acyclovir 400 mg twice daily   Macular degeneration, unspecified laterality, unspecified type Assessment &  Plan: Follow with ophthalmology.  Continue eye injections and timolol 0.5% drops.         Doreene Nest, NP

## 2023-01-13 NOTE — Assessment & Plan Note (Signed)
Follow with ophthalmology.  Continue acyclovir 400 mg twice daily

## 2023-01-13 NOTE — Assessment & Plan Note (Signed)
Commended him on regular activity and exercise. Repeat lipid panel pending.  Remain off treatment.

## 2023-01-13 NOTE — Patient Instructions (Signed)
Stop by the lab prior to leaving today. I will notify you of your results once received.   It was a pleasure to see you today!  

## 2023-01-18 ENCOUNTER — Encounter: Payer: Self-pay | Admitting: Dermatology

## 2023-01-18 ENCOUNTER — Ambulatory Visit (INDEPENDENT_AMBULATORY_CARE_PROVIDER_SITE_OTHER): Payer: Medicare Other | Admitting: Dermatology

## 2023-01-18 VITALS — BP 129/89 | HR 67

## 2023-01-18 DIAGNOSIS — Z872 Personal history of diseases of the skin and subcutaneous tissue: Secondary | ICD-10-CM

## 2023-01-18 DIAGNOSIS — W908XXA Exposure to other nonionizing radiation, initial encounter: Secondary | ICD-10-CM | POA: Diagnosis not present

## 2023-01-18 DIAGNOSIS — L570 Actinic keratosis: Secondary | ICD-10-CM | POA: Diagnosis not present

## 2023-01-18 DIAGNOSIS — D229 Melanocytic nevi, unspecified: Secondary | ICD-10-CM

## 2023-01-18 DIAGNOSIS — D492 Neoplasm of unspecified behavior of bone, soft tissue, and skin: Secondary | ICD-10-CM

## 2023-01-18 DIAGNOSIS — L820 Inflamed seborrheic keratosis: Secondary | ICD-10-CM

## 2023-01-18 DIAGNOSIS — D1801 Hemangioma of skin and subcutaneous tissue: Secondary | ICD-10-CM

## 2023-01-18 DIAGNOSIS — L578 Other skin changes due to chronic exposure to nonionizing radiation: Secondary | ICD-10-CM

## 2023-01-18 DIAGNOSIS — D225 Melanocytic nevi of trunk: Secondary | ICD-10-CM | POA: Diagnosis not present

## 2023-01-18 DIAGNOSIS — Z85828 Personal history of other malignant neoplasm of skin: Secondary | ICD-10-CM

## 2023-01-18 DIAGNOSIS — L814 Other melanin hyperpigmentation: Secondary | ICD-10-CM | POA: Diagnosis not present

## 2023-01-18 DIAGNOSIS — L309 Dermatitis, unspecified: Secondary | ICD-10-CM

## 2023-01-18 DIAGNOSIS — L821 Other seborrheic keratosis: Secondary | ICD-10-CM

## 2023-01-18 DIAGNOSIS — Z1283 Encounter for screening for malignant neoplasm of skin: Secondary | ICD-10-CM | POA: Diagnosis not present

## 2023-01-18 DIAGNOSIS — L905 Scar conditions and fibrosis of skin: Secondary | ICD-10-CM

## 2023-01-18 NOTE — Progress Notes (Signed)
Follow-Up Visit   Subjective  Bryan Avila is a 82 y.o. male who presents for the following: Skin Cancer Screening and Full Body Skin Exam. Hx of BCCs. Hx of AKs  The patient presents for Total-Body Skin Exam (TBSE) for skin cancer screening and mole check. The patient has spots, moles and lesions to be evaluated, some may be new or changing and the patient may have concern these could be cancer.    The following portions of the chart were reviewed this encounter and updated as appropriate: medications, allergies, medical history  Review of Systems:  No other skin or systemic complaints except as noted in HPI or Assessment and Plan.  Objective  Well appearing patient in no apparent distress; mood and affect are within normal limits.  A full examination was performed including scalp, head, eyes, ears, nose, lips, neck, chest, axillae, abdomen, back, buttocks, bilateral upper extremities, bilateral lower extremities, hands, feet, fingers, toes, fingernails, and toenails. All findings within normal limits unless otherwise noted below.   Relevant physical exam findings are noted in the Assessment and Plan.  left nasal tip x1 Erythematous thin papules/macules with gritty scale.   Right Chest 11 mm pink plaque with telangiectasias       Left Frontal Scalp 12 mm keratotic pink plaque         Assessment & Plan   HISTORY OF BASAL CELL CARCINOMA OF THE SKIN. Left vertex scalp, 2020. Right nasal tip, 2021.  - No evidence of recurrence today - Recommend regular full body skin exams - Recommend daily broad spectrum sunscreen SPF 30+ to sun-exposed areas, reapply every 2 hours as needed.  - Call if any new or changing lesions are noted between office visits   SKIN CANCER SCREENING PERFORMED TODAY.  ACTINIC DAMAGE - Chronic condition, secondary to cumulative UV/sun exposure - diffuse scaly erythematous macules with underlying dyspigmentation - Recommend daily broad  spectrum sunscreen SPF 30+ to sun-exposed areas, reapply every 2 hours as needed.  - Staying in the shade or wearing long sleeves, sun glasses (UVA+UVB protection) and wide brim hats (4-inch brim around the entire circumference of the hat) are also recommended for sun protection.  - Call for new or changing lesions.  LENTIGINES, SEBORRHEIC KERATOSES, HEMANGIOMAS - Benign normal skin lesions - Benign-appearing - Call for any changes  MELANOCYTIC NEVI - Tan-brown and/or pink-flesh-colored symmetric macules and papules - Benign appearing on exam today - Observation - Call clinic for new or changing moles - Recommend daily use of broad spectrum spf 30+ sunscreen to sun-exposed areas.    SCAR Exam: pink indurated plaque at left thenar eminence. Benign-appearing.  Observation.  Call clinic for new or changing lesions. Recommend daily broad spectrum sunscreen SPF 30+, reapply every 2 hours as needed. Treatment: Recommend Serica moisturizing scar formula cream every night or [facility] brand or Mederma silicone scar sheet every night for the first year after a scar appears to help with scar remodeling if desired. Scars remodel on their own for a full year and will gradually improve in appearance over time.  Dermatitis  Exam: Pink scaly plaque at 4th and 5th MCPs  Treatment Plan: Discussed Rx topical corticosteroid. Patient deferred. Continue OTC moisturizing cream   AK (actinic keratosis) left nasal tip x1  Actinic keratoses are precancerous spots that appear secondary to cumulative UV radiation exposure/sun exposure over time. They are chronic with expected duration over 1 year. A portion of actinic keratoses will progress to squamous cell carcinoma of the skin.  It is not possible to reliably predict which spots will progress to skin cancer and so treatment is recommended to prevent development of skin cancer.  Recommend daily broad spectrum sunscreen SPF 30+ to sun-exposed areas, reapply  every 2 hours as needed.  Recommend staying in the shade or wearing long sleeves, sun glasses (UVA+UVB protection) and wide brim hats (4-inch brim around the entire circumference of the hat). Call for new or changing lesions.  Destruction of lesion - left nasal tip x1  Destruction method: cryotherapy   Informed consent: discussed and consent obtained   Lesion destroyed using liquid nitrogen: Yes   Region frozen until ice ball extended beyond lesion: Yes   Outcome: patient tolerated procedure well with no complications   Post-procedure details: wound care instructions given   Additional details:  Prior to procedure, discussed risks of blister formation, small wound, skin dyspigmentation, or rare scar following cryotherapy. Recommend Vaseline ointment to treated areas while healing.   Neoplasm of skin (2) Right Chest  Skin / nail biopsy Type of biopsy: tangential   Informed consent: discussed and consent obtained   Anesthesia: the lesion was anesthetized in a standard fashion   Anesthesia comment:  Area prepped with alcohol Anesthetic:  1% lidocaine w/ epinephrine 1-100,000 buffered w/ 8.4% NaHCO3 Instrument used: DermaBlade   Hemostasis achieved with: pressure and aluminum chloride   Outcome: patient tolerated procedure well   Post-procedure details: wound care instructions given   Post-procedure details comment:  Ointment and small bandage applied  Specimen 1 - Surgical pathology Differential Diagnosis: R/O SCC  Check Margins: No  Left Frontal Scalp  Skin / nail biopsy Type of biopsy: tangential   Informed consent: discussed and consent obtained   Anesthesia: the lesion was anesthetized in a standard fashion   Anesthesia comment:  Area prepped with alcohol Anesthetic:  1% lidocaine w/ epinephrine 1-100,000 buffered w/ 8.4% NaHCO3 Instrument used: DermaBlade   Hemostasis achieved with: pressure and aluminum chloride   Outcome: patient tolerated procedure well    Post-procedure details: wound care instructions given   Post-procedure details comment:  Ointment and small bandage applied  Specimen 2 - Surgical pathology Differential Diagnosis: R/O SCC  Check Margins: No   Return in about 1 year (around 01/18/2024) for TBSE, HxBCC.  I, Lawson Radar, CMA, am acting as scribe for Elie Goody, MD.   Documentation: I have reviewed the above documentation for accuracy and completeness, and I agree with the above.  Elie Goody, MD

## 2023-01-18 NOTE — Patient Instructions (Addendum)
 Cryotherapy Aftercare  Wash gently with soap and water everyday.   Apply Vaseline Jelly daily until healed.    Wound Care Instructions  Cleanse wound gently with soap and water once a day then pat dry with clean gauze. Apply a thin coat of Petrolatum (petroleum jelly, "Vaseline") over the wound (unless you have an allergy to this). We recommend that you use a new, sterile tube of Vaseline. Do not pick or remove scabs. Do not remove the yellow or white "healing tissue" from the base of the wound.  Cover the wound with fresh, clean, nonstick gauze and secure with paper tape. You may use Band-Aids in place of gauze and tape if the wound is small enough, but would recommend trimming much of the tape off as there is often too much. Sometimes Band-Aids can irritate the skin.  You should call the office for your biopsy report after 1 week if you have not already been contacted.  If you experience any problems, such as abnormal amounts of bleeding, swelling, significant bruising, significant pain, or evidence of infection, please call the office immediately.  FOR ADULT SURGERY PATIENTS: If you need something for pain relief you may take 1 extra strength Tylenol (acetaminophen) AND 2 Ibuprofen (200mg  each) together every 4 hours as needed for pain. (do not take these if you are allergic to them or if you have a reason you should not take them.) Typically, you may only need pain medication for 1 to 3 days.      Recommend daily broad spectrum sunscreen SPF 30+ to sun-exposed areas, reapply every 2 hours as needed. Call for new or changing lesions.  Staying in the shade or wearing long sleeves, sun glasses (UVA+UVB protection) and wide brim hats (4-inch brim around the entire circumference of the hat) are also recommended for sun protection.    Melanoma ABCDEs  Melanoma is the most dangerous type of skin cancer, and is the leading cause of death from skin disease.  You are more likely to develop  melanoma if you: Have light-colored skin, light-colored eyes, or red or blond hair Spend a lot of time in the sun Tan regularly, either outdoors or in a tanning bed Have had blistering sunburns, especially during childhood Have a close family member who has had a melanoma Have atypical moles or large birthmarks  Early detection of melanoma is key since treatment is typically straightforward and cure rates are extremely high if we catch it early.   The first sign of melanoma is often a change in a mole or a new dark spot.  The ABCDE system is a way of remembering the signs of melanoma.  A for asymmetry:  The two halves do not match. B for border:  The edges of the growth are irregular. C for color:  A mixture of colors are present instead of an even brown color. D for diameter:  Melanomas are usually (but not always) greater than 6mm - the size of a pencil eraser. E for evolution:  The spot keeps changing in size, shape, and color.  Please check your skin once per month between visits. You can use a small mirror in front and a large mirror behind you to keep an eye on the back side or your body.   If you see any new or changing lesions before your next follow-up, please call to schedule a visit.  Please continue daily skin protection including broad spectrum sunscreen SPF 30+ to sun-exposed areas, reapplying every 2 hours  as needed when you're outdoors.   Staying in the shade or wearing long sleeves, sun glasses (UVA+UVB protection) and wide brim hats (4-inch brim around the entire circumference of the hat) are also recommended for sun protection.    Due to recent changes in healthcare laws, you may see results of your pathology and/or laboratory studies on MyChart before the doctors have had a chance to review them. We understand that in some cases there may be results that are confusing or concerning to you. Please understand that not all results are received at the same time and often the  doctors may need to interpret multiple results in order to provide you with the best plan of care or course of treatment. Therefore, we ask that you please give [place] 2 business days to thoroughly review all your results before contacting the office for clarification. Should we see a critical lab result, you will be contacted sooner.   If You Need Anything After Your Visit  If you have any questions or concerns for your doctor, please call our main line at [phone] and press option 4 to reach your doctor's medical assistant. If no one answers, please leave a voicemail as directed and we will return your call as soon as possible. Messages left after 4 pm will be answered the following business day.   You may also send [place] a message via MyChart. We typically respond to MyChart messages within 1-2 business days.  For prescription refills, please ask your pharmacy to contact our office. Our fax number is [phone].  If you have an urgent issue when the clinic is closed that cannot wait until the next business day, you can page your doctor at the number below.    Please note that while we do our best to be available for urgent issues outside of office hours, we are not available 24/7.   If you have an urgent issue and are unable to reach [place], you may choose to seek medical care at your doctor's office, retail clinic, urgent care center, or emergency room.  If you have a medical emergency, please immediately call 911 or go to the emergency department.  Pager Numbers  - Dr. Gwen Pounds: [phone]  - Dr. Roseanne Reno: [phone]  - Dr. Katrinka Blazing: [phone]   In the event of inclement weather, please call our main line at [phone] for an update on the status of any delays or closures.  Dermatology Medication Tips: Please keep the boxes that topical medications come in in order to help keep track of the instructions about where and how to use these. Pharmacies typically print the medication  instructions only on the boxes and not directly on the medication tubes.   If your medication is too expensive, please contact our office at [phone] option 4 or send [place] a message through MyChart.   We are unable to tell what your co-pay for medications will be in advance as this is different depending on your insurance coverage. However, we may be able to find a substitute medication at lower cost or fill out paperwork to get insurance to cover a needed medication.   If a prior authorization is required to get your medication covered by your insurance company, please allow [place] 1-2 business days to complete this process.  Drug prices often vary depending on where the prescription is filled and some pharmacies may offer cheaper prices.  The website www.goodrx.com contains coupons for medications through different pharmacies. The prices here do not account for  what the cost may be with help from insurance (it may be cheaper with your insurance), but the website can give you the price if you did not use any insurance.  - You can print the associated coupon and take it with your prescription to the pharmacy.  - You may also stop by our office during regular business hours and pick up a GoodRx coupon card.  - If you need your prescription sent electronically to a different pharmacy, notify our office through [facility] or by phone at [phone] option 4.     Si Usted Necesita Algo Despus de Su Visita  Tambin puede enviarnos un mensaje a travs de Clinical cytogeneticist. Por lo general respondemos a los mensajes de MyChart en el transcurso de 1 a 2 das hbiles.  Para renovar recetas, por favor pida a su farmacia que se ponga en contacto con nuestra oficina. Annie Sable de fax es [place] [phone].  Si tiene un asunto urgente cuando la clnica est cerrada y que no puede esperar hasta el siguiente da hbil, puede llamar/localizar a su doctor(a) al nmero que aparece a continuacin.   Por  favor, tenga en cuenta que aunque hacemos todo lo posible para estar disponibles para asuntos urgentes fuera del horario de [place], no estamos disponibles las 24 horas del da, los 7 [address]  [address] de la [place].   Si tiene un problema urgente y no puede comunicarse con nosotros, puede optar por buscar atencin mdica  en el consultorio de su doctor(a), en una clnica privada, en un centro de atencin urgente o en una sala de emergencias.  Si tiene Engineer, drilling, por favor llame inmediatamente al 911 o vaya a la sala de emergencias.  Nmeros de bper  - Dr. Gwen Pounds: [phone]  - Dra. Roseanne Reno: [phone]  - Dr. Katrinka Blazing: [phone]   En caso de inclemencias del tiempo, por favor llame a Lacy Duverney principal al [phone] para una actualizacin sobre el [place] de cualquier retraso o cierre.  Consejos para la medicacin en dermatologa: Por favor, guarde las cajas en las que vienen los medicamentos de uso tpico para ayudarle a seguir las instrucciones sobre dnde y cmo usarlos. Las farmacias generalmente imprimen las instrucciones del medicamento slo en las cajas y no directamente en los tubos del [place].   Si su medicamento es muy caro, por favor, pngase en contacto con Rolm Gala llamando al [phone] y presione la opcin 4 o envenos un mensaje a travs de Clinical cytogeneticist.   No podemos decirle cul ser su copago por los medicamentos por adelantado ya que esto es diferente dependiendo de la cobertura de su seguro. Sin embargo, es posible que podamos encontrar un medicamento sustituto a Audiological scientist un formulario para que el seguro cubra el medicamento que se considera necesario.   Si se requiere una autorizacin previa para que su compaa de seguros [place] su medicamento, por favor permtanos de 1 a 2 das hbiles para completar [address]  Los precios de los medicamentos varan con frecuencia dependiendo del Environmental consultant de dnde se surte la receta y alguna farmacias  pueden ofrecer precios ms baratos.  El sitio web www.goodrx.com tiene cupones para medicamentos de Health and safety inspector. Los precios aqu no tienen en cuenta lo que podra costar con la ayuda del seguro (puede ser ms barato con su seguro), pero el sitio web puede darle el precio si no utiliz Tourist information centre manager.  - Puede imprimir el cupn correspondiente y llevarlo con su receta a la farmacia.  Laroy Apple  puede pasar por nuestra oficina durante el horario de atencin regular y Education officer, museum una tarjeta de cupones de GoodRx.  - Si necesita que su receta se enve electrnicamente a una farmacia diferente, informe a nuestra oficina a travs de MyChart de [place] o por telfono llamando al [phone] y presione la opcin 4.

## 2023-01-21 LAB — SURGICAL PATHOLOGY

## 2023-02-28 ENCOUNTER — Ambulatory Visit
Admission: RE | Admit: 2023-02-28 | Discharge: 2023-02-28 | Disposition: A | Discharge status: Home / Self Care | Payer: Medicare Other | Source: Ambulatory Visit | Attending: Emergency Medicine | Admitting: Emergency Medicine

## 2023-02-28 VITALS — BP 135/69 | HR 78 | Temp 97.8°F | Resp 16

## 2023-02-28 DIAGNOSIS — S61211A Laceration without foreign body of left index finger without damage to nail, initial encounter: Secondary | ICD-10-CM | POA: Diagnosis not present

## 2023-02-28 DIAGNOSIS — Z23 Encounter for immunization: Secondary | ICD-10-CM

## 2023-02-28 MED ORDER — TETANUS-DIPHTH-ACELL PERTUSSIS 5-2.5-18.5 LF-MCG/0.5 IM SUSY
0.5000 mL | PREFILLED_SYRINGE | Freq: Once | INTRAMUSCULAR | Status: AC
  Administered 2023-02-28: 0.5 mL via INTRAMUSCULAR

## 2023-02-28 NOTE — ED Triage Notes (Signed)
Patient presents to UC for laceration to left index finger x 2 days ago. Has a dress secured over finger. Unknown of last Tdap.

## 2023-02-28 NOTE — ED Notes (Signed)
Last Tdap 2016

## 2023-02-28 NOTE — ED Provider Notes (Addendum)
Bryan Avila    CSN: [id number] Arrival date & time: 02/28/23  1428      History   Chief Complaint Chief Complaint  Patient presents with   Finger Injury    Entered by patient    HPI Bryan Avila is a 82 y.o. male.   Patient presents for evaluation of a laceration to the left index finger occurring 2 days ago.  Recur while attempting to fix a chair within home that had 2 metal ends. has been covering with gauze however at removal has been seeing bleeding and had concerns regarding healing.  Full range of motion of the finger.  Denies numbness or tingling.  Unsure of last tetanus.  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 Authorizing Provider  acyclovir (ZOVIRAX) 400 MG tablet Take 400 mg by mouth 2 (two) times daily. 07/06/15   [provider]  benzonatate (TESSALON) 100 MG capsule Take 1 capsule (100 mg total) by mouth every 8 (eight) hours. Patient not taking: Reported on 01/01/2022 05/01/21   Rushie Chestnut, PA-C  fluticasone [facility]) 50 MCG/ACT nasal  spray Place 2 sprays into both nostrils daily. Patient not taking: Reported on 01/01/2022 05/01/21   Rushie Chestnut, PA-C  Multiple Vitamins-Minerals (PRESERVISION AREDS PO) Take by mouth 2 (two) times daily.    [provider]  timolol (TIMOPTIC) 0.5 % ophthalmic solution Place 1 drop into the right eye daily.  06/10/16   [provider]    Family History Family History  Problem Relation Age of Onset   Arthritis Father    Sleep apnea Father    Alcohol abuse Brother    Prostate cancer Brother    Cancer Paternal Grandfather        prostate    Social History Social History   Tobacco Use   Smoking status: Never   Smokeless tobacco: Never  Vaping Use   Vaping status: Never Used  Substance Use Topics   Alcohol use: No   Drug use: No     Allergies   Meperidine   Review of Systems Review of Systems   Physical Exam Triage Vital Signs ED Triage Vitals [02/28/23 1446]  Encounter Vitals Group     BP 135/69     Systolic BP Percentile      Diastolic BP Percentile      Pulse Rate 78     Resp 16     Temp 97.8 F (36.6 C)     Temp Source Temporal  SpO2 97 %     Weight      Height      Head Circumference      Peak Flow      Pain Score 0     Pain Loc      Pain Education      Exclude from Growth Chart    No data found.  Updated Vital Signs BP 135/69 (BP Location: Left Arm)   Pulse 78   Temp 97.8 F (36.6 C) (Temporal)   Resp 16   SpO2 97%   Visual Acuity Right Eye Distance:   Left Eye Distance:   Bilateral Distance:    Right Eye Near:   Left Eye Near:    Bilateral Near:     Physical Exam Constitutional:      Appearance: Normal appearance.  Eyes:     Extraocular Movements: Extraocular movements intact.  Pulmonary:     Effort: Pulmonary effort is normal.  Skin:    Comments: 0.5 x 0.5 superficial laceration present to the distal phalanx of the left index finger on the palmar aspect  Neurological:     Mental Status: He is alert  and oriented to person, place, and time. Mental status is at baseline.      UC Treatments / Results  Labs (all labs ordered are listed, but only abnormal results are displayed) Labs Reviewed - No data to display  EKG   Radiology No results found.  Procedures Procedures (including critical care time)  Medications Ordered in UC Medications  Tdap (BOOSTRIX) injection 0.5 mL (0.5 mLs Intramuscular Given by Other 02/28/23 1536)    Initial Impression / Assessment and Plan / UC Course  I have reviewed the triage vital signs and the nursing notes.  Pertinent labs & imaging results that were available during my care of the patient were reviewed by me and considered in my medical decision making (see chart for details).  Laceration of left index finger without foreign body without damage to nail, initial encounter  Superficial laceration present, past 24-hour window, does not need closure, cleansed with chlorhexidine and Steri-Strip applied, advised daily cleansing with soap and water, patting and not rubbing, may leave open to air, should heal without complication advised to watch for signs of infection to return for any concerns regarding healing, tetanus updated Final Clinical Impressions(s) / UC Diagnoses   Final diagnoses:  Laceration of left index finger without foreign body without damage to nail, initial encounter     Discharge Instructions      Today you are evaluated for the cut on your finger, appears that the first top layer of skin has been removed will most likely heal within 1 to 2 weeks  Wound has been cleansed here today in the clinic, Steri-Strips have been applied  Steri-Strips fall off with time, do not remove, helps to keep the wound as clean as possible,   cleanse daily at home with unscented soap and water, pat and do not rub, do not need to cover as Steri-Strips are in place  You may use Tylenol every 6 hours as needed for pain  You may follow-up with  this urgent care as needed for any concerns regarding healing  Tetanus vaccine has been updated, good for 10 years   ED Prescriptions   None    PDMP not reviewed this encounter.   Valinda Hoar, NP 02/28/23 1554    Valinda Hoar, NP 02/28/23 1554

## 2023-02-28 NOTE — Discharge Instructions (Addendum)
Today you are evaluated for the cut on your finger, appears that the first top layer of skin has been removed will most likely heal within 1 to 2 weeks  Wound has been cleansed here today in the clinic, Steri-Strips have been applied  Steri-Strips fall off with time, do not remove, helps to keep the wound as clean as possible,   cleanse daily at home with unscented soap and water, pat and do not rub, do not need to cover as Steri-Strips are in place  You may use Tylenol every 6 hours as needed for pain  You may follow-up with this urgent care as needed for any concerns regarding healing  Tetanus vaccine has been updated, good for 10 years

## 2023-05-09 ENCOUNTER — Ambulatory Visit: Payer: Medicare Other | Admitting: Primary Care

## 2023-05-10 ENCOUNTER — Ambulatory Visit (INDEPENDENT_AMBULATORY_CARE_PROVIDER_SITE_OTHER): Payer: Medicare Other | Admitting: Family Medicine

## 2023-05-10 VITALS — BP 112/84 | HR 55 | Temp 98.5°F | Ht 66.5 in | Wt 152.0 lb

## 2023-05-10 DIAGNOSIS — L089 Local infection of the skin and subcutaneous tissue, unspecified: Secondary | ICD-10-CM

## 2023-05-10 MED ORDER — CEPHALEXIN 500 MG PO CAPS: 500.0000 mg | ORAL_CAPSULE | Freq: Three times a day (TID) | ORAL | 0 refills | Status: DC

## 2023-05-10 NOTE — Progress Notes (Signed)
 Patient ID: Bryan Avila, male    DOB: 06-26-1940, 82 y.o.   MRN: [id number]  This visit was conducted in person.  BP 112/84 (BP Location: Left Arm, Patient Position: Sitting, Cuff Size: Normal)   Pulse (!) 55   Temp 98.5 F (36.9 C) (Temporal)   Ht 5' 6.5 (1.689 m)   Wt 152 lb (68.9 kg)   SpO2 98%   BMI 24.17 kg/m    CC:  Chief Complaint  Patient presents with   Sore on Back of Right Kneed    Subjective:   HPI: Bryan Avila is a 82 y.o. male patient of Bryan Avila presenting on 05/10/2023 for Sore on Back of Right Kneed    Noted skin lesion x 2 weeks ago on right posterior knee. No change in size.  No pain, just felt the area. Not itchy.  No discharge.  Wife feels it could have some pus in it.   No fever.    No knee pain.  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  No MRSA history , no recurrenboils.       Relevant past medical, surgical, family and social history reviewed and updated as indicated. Interim medical history since our last visit reviewed. Allergies and medications reviewed and updated. Outpatient Medications Prior to Visit  Medication Sig Dispense Refill   acyclovir (ZOVIRAX) 400 MG tablet Take 400 mg by mouth 2 (two) times daily.  99   Multiple Vitamins-Minerals (PRESERVISION AREDS PO) Take by mouth 2 (two) times daily.     timolol (TIMOPTIC) 0.5 % ophthalmic solution Place 1 drop into the right eye daily.      benzonatate  (TESSALON ) 100 MG capsule Take 1 capsule (100 mg total) by mouth every 8 (eight) hours. (Patient not taking: Reported on 01/01/2022) 21 capsule 0   fluticasone  (FLONASE ) 50 MCG/ACT nasal spray Place 2 sprays into both nostrils daily. (Patient not taking: Reported on 01/01/2022) 16 mL 0   No facility-administered medications prior to visit.     Per HPI unless specifically indicated in ROS section below Review of Systems  Constitutional:  Negative for fatigue and fever.  HENT:  Negative for ear pain.   Eyes:  Negative for pain.   Respiratory:  Negative for cough and shortness of breath.   Cardiovascular:  Negative for chest pain, palpitations and leg swelling.  Gastrointestinal:  Negative for abdominal pain.  Genitourinary:  Negative for dysuria.  Musculoskeletal:  Negative for arthralgias.  Neurological:  Negative for syncope, light-headedness and headaches.  Psychiatric/Behavioral:  Negative for dysphoric mood.    Objective:  BP 112/84 (BP Location: Left Arm, Patient Position: Sitting, Cuff Size: Normal)   Pulse (!) 55   Temp 98.5 F (36.9 C) (Temporal)   Ht 5' 6.5 (1.689 m)   Wt 152 lb (68.9 kg)   SpO2 98%   BMI 24.17 kg/m   Wt Readings from Last 3 Encounters:  05/10/23 152 lb (68.9 kg)  01/13/23 147 lb (66.7 kg)  01/06/23 146 lb (66.2 kg)      Physical Exam Vitals reviewed.  Constitutional:      Appearance: He is well-developed.  HENT:     Head: Normocephalic.     Right Ear: Hearing normal.     Left Ear: Hearing normal.     Nose: Nose normal.  Neck:     Thyroid : No thyroid  mass or thyromegaly.     Vascular: No carotid bruit.     Trachea: Trachea normal.  Cardiovascular:  Rate and Rhythm: Normal rate and regular rhythm.     Pulses: Normal pulses.     Heart sounds: Heart sounds not distant. No murmur heard.    No friction rub. No gallop.     Comments: No peripheral edema Pulmonary:     Effort: Pulmonary effort is normal. No respiratory distress.     Breath sounds: Normal breath sounds.  Skin:    General: Skin is warm and dry.     Findings: No rash.     Comments: Raised skin lesion on right posterior knee, slight surrounding redness and lesion tender to touch.  Psychiatric:        Speech: Speech normal.        Behavior: Behavior normal.        Thought Content: Thought content normal.       Results for orders placed or performed in visit on 01/18/23  Surgical pathology   Collection Time: 01/18/23 12:00 AM  Result Value Ref Range   SURGICAL PATHOLOGY      SURGICAL  PATHOLOGY [facility] [address] [place], [place] [address] Telephone [phone] or [phone] Fax [phone]  REPORT OF DERMATOPATHOLOGY   Accession #: [id number] Patient Name: Bryan, Avila Visit # : [id number]  MRN: [id number] Cytotechnologist: Munoz-Bishop Md, Eleanor, Dermatopathologist, Electronic Signature DOB/Age 02/20/1941 (Age: 58) Gender: M Collected Date: 01/18/2023 Received Date: 01/18/2023  FINAL DIAGNOSIS       1. Skin, right chest :       MELANOCYTIC NEVUS, INTRADERMAL TYPE       2. Skin, left frontal scalp :       SEBORRHEIC KERATOSIS, IRRITATED       ELECTRONIC SIGNATURE : Munoz-Bishop Md, Melissa, Dermatopathologist, Electronic Signature  MICROSCOPIC DESCRIPTION 1. There is a proliferation of banal melanocytes predominantly in the dermis.There is no atypia. 2. The epidermis exhibits hyperkeratosis, acanthosis, elongation of rete ridges and papillomatosis.Some of the rete are interconn ected.Cells in some areas have squamoid rather than basaloid features.The pattern is that of irritated seborrheic keratosis.Focally, there are some features, which resemble those seen in a verruca.  CASE COMMENTS STAINS USED IN DIAGNOSIS: H&E H&E    CLINICAL HISTORY  SPECIMEN(S) OBTAINED 1. Skin, Right Chest 2. Skin, Left Frontal Scalp  SPECIMEN COMMENTS: 1. 11 mm pink plaque with telangiectasias 2. 12 mm keratotic pink plaque SPECIMEN CLINICAL INFORMATION: 1. Neoplasm of skin, R/O SCC 2. Neoplasm of skin, R/O SCC    Gross Description 1. Formalin fixed specimen received:  13 X 7 X 1 MM, TOTO (5 P) (1 B) ( ew ) 2. Formalin fixed specimen received:  15 X 8 X 5 MM, TOTO (5 P) (1 B) ( ew )        Report signed out from the following location(s) [facility] [address] [place], [place] [address] CLIA #: [id number]  [facility] [address] [place], [place] [address]  CLIA #: [id number]     Assessment and Plan  Skin lesion, infected Assessment & Plan: Acute, possible subcutaneous cyst versus basal/squamous cell carcinoma with concern for bacterial superinfection.  Mild cellulitis surrounding central skin lesion by about 2 cm.  Will treat with Keflex  500 mg 3 times a day x 7 days.  Will have patient follow-up in 3 days for reevaluation most likely erythema will resolve but central lesion will remain so patient likely will need to see his dermatologist for early appointment.  Return and ER precautions  provided   Other orders -     Cephalexin ; Take 1 capsule (500 mg total) by mouth 3 (three) times daily.  Dispense: 21 capsule; Refill: 0    Return in about 3 days (around 05/13/2023) for  follow up cellulitis.   Greig Ring, MD

## 2023-05-10 NOTE — Assessment & Plan Note (Signed)
 Acute, possible subcutaneous cyst versus basal/squamous cell carcinoma with concern for bacterial superinfection.  Mild cellulitis surrounding central skin lesion by about 2 cm.  Will treat with Keflex  500 mg 3 times a day x 7 days.  Will have patient follow-up in 3 days for reevaluation most likely erythema will resolve but central lesion will remain so patient likely will need to see his dermatologist for early appointment.  Return and ER precautions provided

## 2023-05-19 ENCOUNTER — Ambulatory Visit: Payer: Medicare Other | Admitting: Primary Care

## 2023-05-24 ENCOUNTER — Ambulatory Visit (INDEPENDENT_AMBULATORY_CARE_PROVIDER_SITE_OTHER): Payer: Medicare Other | Admitting: Primary Care

## 2023-05-24 ENCOUNTER — Encounter: Payer: Self-pay | Admitting: Primary Care

## 2023-05-24 VITALS — BP 122/68 | HR 64 | Temp 97.7°F | Ht 66.5 in | Wt 150.0 lb

## 2023-05-24 DIAGNOSIS — I8393 Asymptomatic varicose veins of bilateral lower extremities: Secondary | ICD-10-CM | POA: Insufficient documentation

## 2023-05-24 NOTE — Patient Instructions (Signed)
 You will either be contacted via phone regarding your referral to vascular as his, or you may receive a letter on your MyChart portal from our referral team with instructions for scheduling an appointment. Please let us  know if you have not been contacted by anyone within two weeks.  Wear compression socks every day.  It was a pleasure to see you today!

## 2023-05-24 NOTE — Assessment & Plan Note (Signed)
 Noted today without complication.  Discussed to start wearing compression socks daily. Referral placed to vascular services for further evaluation and prevention of subsequent vein rupture. He agrees.   Discussed home instructions if this were to occur again.

## 2023-05-24 NOTE — Progress Notes (Signed)
 Subjective:    Patient ID: Bryan Avila, male    DOB: February 23, 1941, 83 y.o.   MRN: [id number]  HPI  Bryan Avila is a very pleasant 83 y.o. male with a history of right bundle branch block, hemorrhoids, osteoarthritis who presents today to discuss varicose veins.  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 This caused pulsating, bleeding. He applied pressure after about 4-5 minutes.   The fire department came to evaluate, the bleeding discontinued by that time. Later that evening, he began showering, noticed a return in the pulsating bleeding, applied pressure, bleeding stopped for after 5 minutes.  He does not take aspirin. He has compression socks, doesn't wear them.   Review of Systems  Respiratory:  Negative for shortness of breath.   Cardiovascular:        Varicose veins  Neurological:  Negative for dizziness.         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   Would not want Tube Feeds.   Does not have a living will or HPOA.   Social Drivers of Corporate Investment Banker Strain: Low Risk  (01/01/2023)   Overall Financial Resource Strain (CARDIA)    Difficulty of Paying Living Expenses: Not hard at all  Food Insecurity: No Food Insecurity (01/01/2023)   Hunger Vital Sign    Worried About Running Out of Food in the Last Year: Never true    Ran Out of Food in the Last Year: Never true  Transportation Needs: No Transportation Needs (01/01/2023)   PRAPARE - Administrator, Civil Service (Medical): No    Lack of Transportation (Non-Medical): No  Physical Activity: Sufficiently Active (01/01/2023)   Exercise Vital Sign    Days of Exercise per Week: 6 days    Minutes of Exercise per Session: 30 min  Stress: No Stress Concern Present (01/01/2023)   [name] of Occupational Health - Occupational Stress Questionnaire    Feeling of Stress : Not at all  Social Connections: Socially Integrated (01/01/2023)   Social Connection and Isolation Panel [NHANES]    Frequency of Communication with Friends and Family: Twice a week    Frequency of Social Gatherings  with Friends and Family: Twice a week    Attends Religious Services: More than 4 times per year    Active Member of [employer] or Organizations: Yes    Attends Banker Meetings: Never    Marital Status: Married  Catering Manager Violence: Not At Risk (01/04/2022)   Humiliation, Afraid, Rape, and Kick questionnaire    Fear of Current or Ex-Partner: No    Emotionally Abused: No    Physically Abused: No    Sexually Abused: No    Past Surgical History:  Procedure Laterality Date   EYE SURGERY     thumb surgery  1999   right    Family History  Problem Relation Age of Onset   Arthritis Father    Sleep apnea Father    Alcohol abuse Brother    Prostate cancer  Brother    Cancer Paternal Grandfather        prostate    Allergies  Allergen Reactions   Meperidine Other (See Comments) and Nausea Only    I almost died from that.-Severe bradycardia Other reaction(s): Unknown DECREASED BLOOD PRESSURE I almost died from that.-Severe bradycardia     Current Outpatient Medications on File Prior to Visit  Medication Sig Dispense Refill   acyclovir (ZOVIRAX) 400 MG tablet Take 400 mg by mouth 2 (two) times daily.  99   Multiple Vitamins-Minerals (PRESERVISION AREDS PO) Take by mouth 2 (two) times daily.     timolol (TIMOPTIC) 0.5 % ophthalmic solution Place 1 drop into the right eye daily.      cephALEXin  (KEFLEX ) 500 MG capsule Take 1 capsule (500 mg total) by mouth 3 (three) times daily. (Patient not taking: Reported on 05/24/2023) 21 capsule 0   No current facility-administered medications on file prior to visit.    BP 122/68   Pulse 64   Temp 97.7 F (36.5 C) (Temporal)   Ht 5' 6.5 (1.689 m)   Wt 150 lb (68 kg)   SpO2 99%   BMI 23.85 kg/m  Objective:   Physical Exam Cardiovascular:     Rate and Rhythm: Normal rate and regular rhythm.     Comments: Numerous spider veins noted to bilateral feet and ankles. No bleeding. Scabbing over right lateral malleolus at the site of recent vein rupture.  Pulmonary:     Effort: Pulmonary effort is normal.  Skin:    General: Skin is warm and dry.     Findings: No erythema.           Assessment & Plan:  Spider veins of both lower extremities Assessment & Plan: Noted today without complication.  Discussed to start wearing compression socks daily. Referral placed to vascular services for further evaluation and prevention of subsequent vein rupture. He agrees.   Discussed home instructions if this were to occur again.  Orders: -     Ambulatory referral to Vascular Surgery        Comer MARLA Gaskins, NP

## 2023-05-27 ENCOUNTER — Telehealth: Payer: Self-pay | Admitting: Primary Care

## 2023-05-27 NOTE — Telephone Encounter (Signed)
Patients wife called and stated they would like a referral  to Clarisa Kindred, NP at [facility] due to swelling in patient legs. Patients wife stated this was mentioned during the last visit. They plan to see vascular as well.

## 2023-05-27 NOTE — Telephone Encounter (Unsigned)
Copied from CRM [id number]. Topic: General - Call Back - No Documentation >> May 27, 2023  3:07 PM Samuel Jester B wrote: Reason for CRM: Pt stated that she would like to speak with Rosey Bath and would like a callback

## 2023-05-29 NOTE — Telephone Encounter (Signed)
Please call patient:  I'm not sure if he needs to see Inetta Fermo. Did she request to see him?  Also, tell him that I can do some additional lab testing for heart failure. Set up a lab appointment and notify me if he's agreeable.

## 2023-05-30 NOTE — Telephone Encounter (Signed)
Called patient and wife they have follow up appointment set up in office with Jae Dire tomorrow. Will keep as scheduled and have any labs needed done at that time.

## 2023-05-30 NOTE — Telephone Encounter (Signed)
Noted  

## 2023-05-31 ENCOUNTER — Ambulatory Visit (INDEPENDENT_AMBULATORY_CARE_PROVIDER_SITE_OTHER): Payer: Medicare Other | Admitting: Primary Care

## 2023-05-31 ENCOUNTER — Encounter: Payer: Self-pay | Admitting: Primary Care

## 2023-05-31 ENCOUNTER — Ambulatory Visit: Payer: Medicare Other | Admitting: Dermatology

## 2023-05-31 VITALS — BP 128/78 | HR 64 | Temp 98.1°F | Ht 66.5 in | Wt 150.0 lb

## 2023-05-31 DIAGNOSIS — I8393 Asymptomatic varicose veins of bilateral lower extremities: Secondary | ICD-10-CM | POA: Diagnosis not present

## 2023-05-31 DIAGNOSIS — R600 Localized edema: Secondary | ICD-10-CM | POA: Insufficient documentation

## 2023-05-31 LAB — BASIC METABOLIC PANEL
BUN: 15 mg/dL (ref 6–23)
CO2: 29 meq/L (ref 19–32)
Calcium: 9.3 mg/dL (ref 8.4–10.5)
Chloride: 105 meq/L (ref 96–112)
Creatinine, Ser: 0.89 mg/dL (ref 0.40–1.50)
GFR: 79.88 mL/min (ref >60.00)
Glucose, Bld: 88 mg/dL (ref 70–99)
Potassium: 4.2 meq/L (ref 3.5–5.1)
Sodium: 141 meq/L (ref 135–145)

## 2023-05-31 LAB — BRAIN NATRIURETIC PEPTIDE: Pro B Natriuretic peptide (BNP): 231 pg/mL — ABNORMAL HIGH (ref 0.0–100.0)

## 2023-05-31 NOTE — Progress Notes (Signed)
Acute Office Visit  Subjective:     Patient ID: Bryan Avila, male    DOB: 22-Apr-1941, 83 y.o.   MRN: [id number]  Chief Complaint  Patient presents with   Joint Swelling    Ankle swelling Friday, he has been wearing compression socks since then and the swelling has improved.     HPI Bryan Avila is an 83 year old male with a history of RBBB, bilateral spider veins of lower extremities and osteoarthritis. He is here today to discuss ankle edema with symptom onset 4 days ago. Slept with legs elevated overnight with symptom improvement next morning. Began wearing bilateral compression stockings 3 days ago with symptom improvement/relief. Denies shortness of breath and exertional dyspnea. Denies pain in legs with walking. No erythema, tenderness, warmth to bilateral lower extremities. No leg heaviness. Follows low-salt/sodium diet. No recent med changes. No reported falls.    Review of Systems  Respiratory:  Negative for shortness of breath.   Cardiovascular:  Negative for chest pain and claudication.       Positive bilateral lower extremity.  Musculoskeletal:  Negative for falls.  Skin:        Varicose veins present on bilateral lower extremities.         Objective:    BP 128/78   Pulse 64   Temp 98.1 F (36.7 C) (Temporal)   Ht 5' 6.5" (1.689 m)   Wt 68 kg   SpO2 97%   BMI 23.85 kg/m    Physical Exam Constitutional:      General: He is not in acute distress.    Appearance: Normal appearance.  Cardiovascular:     Rate and Rhythm: Normal rate and regular rhythm.     Pulses:          Dorsalis pedis pulses are 2+ on the right side and 2+ on the left side.     Comments: Left lower extremity edema, 1+; Right lower extremity without edema; No leg or pedal edema Pulmonary:     Effort: Pulmonary effort is normal.     Breath sounds: Normal breath sounds.  Skin:    General: Skin is warm and dry.     Comments: Healed scab to right lateral malleolus   Neurological:     Mental  Status: He is alert.     No results found for any visits on 05/31/23.      Assessment & Plan:   Problem List Items Addressed This Visit       Cardiovascular and Mediastinum   Spider veins of both lower extremities   Relevant Orders   VAS [place] LOWER EXTREMITY VENOUS REFLUX     Other   Bilateral lower extremity edema - Primary   Based on HPI/exam, chronic venous insufficiency is likely diagnosis as symptoms have improved with compression stockings and leg elevation.  BMET - update electrolytes BNP - rule out CHF as underlying cause  Results pending  Continue compression stockings, leg elevated.  Keep consult with VVS as scheduled.  Lower extremity venous reflux study ordered.   I evaluated patient, was consulted regarding treatment, and agree with assessment and plan per Julaine Fusi, MSN, FNP student.   Mayra Reel, NP-C       Relevant Orders   Brain natriuretic peptide   Basic Metabolic Panel   VAS [place] LOWER EXTREMITY VENOUS REFLUX    No orders of the defined types were placed in this encounter.   Follow up as scheduled with vascular surgeon for consult  Follow  up as needed if symptoms progress/worsen.  Lindell Spar, RN

## 2023-05-31 NOTE — Assessment & Plan Note (Addendum)
Based on HPI/exam, chronic venous insufficiency is likely diagnosis as symptoms have improved with compression stockings and leg elevation.  BMET - update electrolytes BNP - rule out CHF as underlying cause  Results pending  Continue compression stockings, leg elevated.  Keep consult with VVS as scheduled.  Lower extremity venous reflux study ordered.   I evaluated patient, was consulted regarding treatment, and agree with assessment and plan per Julaine Fusi, MSN, FNP student.   Mayra Reel, NP-C

## 2023-05-31 NOTE — Progress Notes (Signed)
Subjective:    Patient ID: Bryan Avila, male    DOB: [birth year], 83 y.o.   MRN: [id number]  HPI  Bryan Avila is a very pleasant 83 y.o. male with a history of RBBB, osteoarthritis, spider veins  who presents today to discuss ankle edema.   Symptom onset four days ago with bilateral ankle edema. He began resting with his legs elevated that evening and noticed improvement the following day. Over the next few days he's been wearing compression socks and has noticed improvement.   He denies pain, erythema, warmth, injury/trauma, exertional dyspnea. He general follows a low sodium diet that has not changed.   Evaluated by me on 05/24/2023 for an episode of bleeding after snagging one of his ankle varicose spider vein.  Referral was placed to vascular service for treatment.   Review of Systems  Respiratory:  Negative for shortness of breath.   Cardiovascular:  Positive for leg swelling. Negative for chest pain and palpitations.  Skin:  Negative for color change and wound.         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   Would not want Tube Feeds.   Does not have a living will or HPOA.   Social Drivers of Corporate investment banker Strain: Low Risk  (01/01/2023)   Overall Financial Resource Strain (CARDIA)    Difficulty of Paying Living Expenses: Not hard at all  Food Insecurity: No Food Insecurity (01/01/2023)   Hunger Vital Sign    Worried About Running Out of Food in the Last Year: Never true    Ran Out of Food in the Last Year: Never true  Transportation Needs: No Transportation Needs (01/01/2023)   PRAPARE - Administrator, Civil Service (Medical): No    Lack of Transportation (Non-Medical): No  Physical Activity: Sufficiently Active (01/01/2023)   Exercise Vital Sign    Days of Exercise per Week: 6 days    Minutes of Exercise per Session: 30 min  Stress: No Stress Concern Present (01/01/2023)   [name] of Occupational Health - Occupational Stress Questionnaire    Feeling of Stress : Not at all  Social Connections: Socially Integrated (01/01/2023)   Social Connection and Isolation Panel [NHANES]    Frequency of Communication with Friends and Family: Twice a week  Frequency of Social Gatherings with Friends and Family: Twice a week    Attends Religious Services: More than 4 times per year    Active Member of [employer] or Organizations: Yes    Attends Banker Meetings: Never    Marital Status: Married  Catering manager Violence: Not At Risk (01/04/2022)   Humiliation, Afraid, Rape, and Kick questionnaire    Fear of Current or Ex-Partner: No    Emotionally Abused: No    Physically Abused: No    Sexually Abused: No    Past Surgical History:  Procedure Laterality Date   EYE SURGERY     thumb surgery  1999   right    Family History  Problem Relation Age of Onset   Arthritis Father    Sleep apnea Father    Alcohol abuse Brother     Prostate cancer Brother    Cancer Paternal Grandfather        prostate    Allergies  Allergen Reactions   Meperidine Other (See Comments) and Nausea Only    "I almost died from that."-Severe bradycardia Other reaction(s): Unknown DECREASED BLOOD PRESSURE "I almost died from that."-Severe bradycardia     Current Outpatient Medications on File Prior to Visit  Medication Sig Dispense Refill   acyclovir (ZOVIRAX) 400 MG tablet Take 400 mg by mouth 2 (two) times daily.  99   Multiple Vitamins-Minerals (PRESERVISION AREDS PO) Take by mouth 2 (two) times daily.     timolol (TIMOPTIC) 0.5 % ophthalmic solution Place 1 drop into the right eye daily.      cephALEXin (KEFLEX) 500 MG capsule Take 1 capsule (500 mg total) by mouth 3 (three) times daily. (Patient not taking: Reported on 05/31/2023) 21 capsule 0   No current facility-administered medications on file prior to visit.    BP 128/78   Pulse 64   Temp 98.1 F (36.7 C) (Temporal)   Ht 5' 6.5" (1.689 m)   Wt 150 lb (68 kg)   SpO2 97%   BMI 23.85 kg/m  Objective:   Physical Exam Cardiovascular:     Rate and Rhythm: Normal rate and regular rhythm.     Pulses:          Dorsalis pedis pulses are 2+ on the right side and 2+ on the left side.       Posterior tibial pulses are 2+ on the right side and 2+ on the left side.     Comments: Mild bilateral ankle swelling, left slightly greater than right. No leg or pedal edema.  Pulmonary:     Effort: Pulmonary effort is normal.  Musculoskeletal:     Cervical back: Neck supple.  Skin:    General: Skin is warm and dry.     Findings: No erythema.  Neurological:     Mental Status: He is alert and oriented to person, place, and time.  Psychiatric:        Mood and Affect: Mood normal.           Assessment & Plan:  Bilateral lower extremity edema Assessment & Plan: Based on HPI/exam, chronic venous insufficiency is likely diagnosis as symptoms have improved with compression  stockings and leg elevation.  BMET - update electrolytes BNP - rule out CHF as underlying cause  Results pending  Continue compression stockings, leg elevated.  Keep consult with [facility] as scheduled.  Lower extremity venous reflux study ordered.   I evaluated patient, was consulted regarding treatment, and agree with assessment and  plan per Julaine Fusi, MSN, FNP student.   Mayra Reel, NP-C   Orders: -     Brain natriuretic peptide -     Basic metabolic panel -     VAS [place] LOWER EXTREMITY VENOUS REFLUX; Future  Spider veins of both lower extremities -     VAS [place] LOWER EXTREMITY VENOUS REFLUX; Future        Doreene Nest, NP

## 2023-05-31 NOTE — Patient Instructions (Signed)
We have ordered some lab work today. We will be in touch with the results.  Please continue with daily compression stockings.  Wear these during the day and remove at night to allow legs to rest.   Please continue to follow a low-salt diet.   Please continue to elevate legs when resting/at night, as able.   We can order a lower extremity venous study to assess the veins in your legs if you would like.   It was a pleasure seeing you today.

## 2023-06-01 ENCOUNTER — Other Ambulatory Visit: Payer: Self-pay | Admitting: Primary Care

## 2023-06-01 DIAGNOSIS — R600 Localized edema: Secondary | ICD-10-CM

## 2023-06-14 ENCOUNTER — Encounter: Payer: Self-pay | Admitting: Dermatology

## 2023-06-14 ENCOUNTER — Ambulatory Visit (INDEPENDENT_AMBULATORY_CARE_PROVIDER_SITE_OTHER): Payer: Medicare Other | Admitting: Dermatology

## 2023-06-14 DIAGNOSIS — D492 Neoplasm of unspecified behavior of bone, soft tissue, and skin: Secondary | ICD-10-CM | POA: Diagnosis not present

## 2023-06-14 DIAGNOSIS — C4492 Squamous cell carcinoma of skin, unspecified: Secondary | ICD-10-CM

## 2023-06-14 DIAGNOSIS — C44722 Squamous cell carcinoma of skin of right lower limb, including hip: Secondary | ICD-10-CM

## 2023-06-14 DIAGNOSIS — D485 Neoplasm of uncertain behavior of skin: Secondary | ICD-10-CM

## 2023-06-14 HISTORY — DX: Squamous cell carcinoma of skin, unspecified: C44.92

## 2023-06-14 NOTE — Progress Notes (Signed)
   Follow-Up Visit   Subjective  Strider Bryan Avila is a 83 y.o. male who presents for the following: Irregular skin lesion R popliteal x 2-3 mths, rubs on clothing, new, will not resolve. Patient concerned and would like lesion checked today.  The patient has spots, moles and lesions to be evaluated, some may be new or changing and the patient may have concern these could be cancer.   The following portions of the chart were reviewed this encounter and updated as appropriate: medications, allergies, medical history  Review of Systems:  No other skin or systemic complaints except as noted in HPI or Assessment and Plan.  Objective  Well appearing patient in no apparent distress; mood and affect are within normal limits.  A focused examination was performed of the following areas:   Relevant exam findings are noted in the Assessment and Plan.  Right Popliteal Fossa 0.9 cm pink papule   Assessment & Plan     NEOPLASM OF UNCERTAIN BEHAVIOR OF SKIN Right Popliteal Fossa Skin / nail biopsy Type of biopsy: tangential   Informed consent: discussed and consent obtained   Timeout: patient name, date of birth, surgical site, and procedure verified   Procedure prep:  Patient was prepped and draped in usual sterile fashion Prep type:  Isopropyl alcohol Anesthesia: the lesion was anesthetized in a standard fashion   Anesthetic:  1% lidocaine  w/ epinephrine 1-100,000 buffered w/ 8.4% NaHCO3 Instrument used: flexible razor blade   Hemostasis achieved with: pressure, aluminum chloride and electrodesiccation   Outcome: patient tolerated procedure well   Post-procedure details: sterile dressing applied and wound care instructions given   Dressing type: bandage and petrolatum   Specimen 1 - Surgical pathology Differential Diagnosis: D48.5 r/o SCC vs cyst Check Margins: No  Return for appointment as scheduled.  LILLETTE Rosina Mayans, CMA, am acting as scribe for Boneta Sharps, MD  .   Documentation: I have reviewed the above documentation for accuracy and completeness, and I agree with the above.  Boneta Sharps, MD

## 2023-06-14 NOTE — Patient Instructions (Addendum)

## 2023-06-17 LAB — SURGICAL PATHOLOGY

## 2023-06-20 ENCOUNTER — Telehealth: Payer: Self-pay

## 2023-06-20 NOTE — Telephone Encounter (Signed)
-----   Message from Trilla sent at 06/19/2023 11:29 AM EST ----- Diagnosis: right popliteal fossa :       WELL DIFFERENTIATED SQUAMOUS CELL CARCINOMA, CYSTIC PATTERN    Please call to share diagnosis and discuss treatment  Explanation: This is a squamous cell skin cancer that has grown beyond the surface of the skin and is invading the second layer of the skin. It has the potential to spread beyond the skin and threaten your health, so I recommend treating it.  Treatment: you return for an hour long appointment where we perform a skin surgery. We numb the site of the skin cancer and a safety margin of normal skin around it. We remove the full thickness of skin and close the wound with two layers of stitches. The sample is sent to the lab to check that the skin cancer was fully removed. Return one week later to have wound checked and surface stitches removed. Surgical wound leaves a line scar. Approximately 95% cure rate. Risk of recurrence, bleeding, infection, pain, injury to nearby structures, hypertrophic scar.

## 2023-06-27 ENCOUNTER — Ambulatory Visit: Payer: Medicare Other | Attending: Primary Care

## 2023-06-27 ENCOUNTER — Telehealth: Payer: Self-pay | Admitting: *Deleted

## 2023-06-27 ENCOUNTER — Other Ambulatory Visit: Payer: Self-pay

## 2023-06-27 DIAGNOSIS — R600 Localized edema: Secondary | ICD-10-CM | POA: Diagnosis not present

## 2023-06-27 DIAGNOSIS — I4891 Unspecified atrial fibrillation: Secondary | ICD-10-CM

## 2023-06-27 DIAGNOSIS — I4510 Unspecified right bundle-branch block: Secondary | ICD-10-CM

## 2023-06-27 LAB — ECHOCARDIOGRAM COMPLETE: S' Lateral: 3.4 cm

## 2023-06-27 NOTE — Telephone Encounter (Signed)
 Per Dr. Mariah Milling, the patient needs a follow up appointment. Appointment has been made with Dr. Mariah Milling on 2/24. He has been advised to call back if anything is needed before then.

## 2023-06-27 NOTE — Telephone Encounter (Signed)
 Patient came in for an echo with possible Afib. EKG has been completed and given to DOD.

## 2023-07-01 ENCOUNTER — Encounter (INDEPENDENT_AMBULATORY_CARE_PROVIDER_SITE_OTHER): Payer: Self-pay | Admitting: Nurse Practitioner

## 2023-07-03 DIAGNOSIS — R7989 Other specified abnormal findings of blood chemistry: Secondary | ICD-10-CM | POA: Insufficient documentation

## 2023-07-03 DIAGNOSIS — I517 Cardiomegaly: Secondary | ICD-10-CM | POA: Insufficient documentation

## 2023-07-03 DIAGNOSIS — I272 Pulmonary hypertension, unspecified: Secondary | ICD-10-CM | POA: Insufficient documentation

## 2023-07-03 NOTE — Progress Notes (Addendum)
 Cardiology Office Note  Date:  07/04/2023   ID:  Bryan Avila, DOB 03/03/1941, MRN [id number]  PCP:  Doreene Nest, NP   Chief Complaint  Patient presents with   New Patient (Initial Visit)    Ref by Vernona Rieger, NP for elevated BNP/LE edema & discuss recent Echo; A-fib showing during the Echo.     HPI:  Mr. Bryan Avila is a 83 year old gentleman with past medical history of Ankle swelling Hyperlipidemia Decreased ejection fraction 45 to 50%, RV dilation Referral from Vernona Rieger for leg swelling, elevated BNP, atrial fib/flutter  Discussed recent events Reports that he has varicose veins, recent episode of bleeding, ems called Seen by primary care, started evaluation for ankle swelling Reports ankle swelling started January 2025  Echo 06/27/2023, afib on ekg with ejection fraction 45 to 50%, global hypokinesis, low normal RV function, moderately elevated right heart pressures, severely dilated right ventricle Left and right atrium severely dilated Dilated IVC  Reports that he has been wearing compression hose, leg elevation, this is help the swelling now down to normal  Recent lab work reviewed BNP 231 Normal BMP Total cholesterol 181 LDL 96  At baseline reports he is very active, likes to walk 30 minutes Has not been walking for the past month or 2 after varicose vein bleeding  EKG personally reviewed by myself on todays visit EKG Interpretation Date/Time:  Monday July 04 2023 11:26:02 EST Ventricular Rate:  78 PR Interval:    QRS Duration:  132 QT Interval:  416 QTC Calculation: 474 R Axis:   -25  Text Interpretation: Atrial fibrillation Right bundle branch block When compared with ECG of 27-Jun-2023 09:44, T wave inversion now evident in Anterior leads Confirmed by Julien Nordmann [phone]) on 07/04/2023 11:56:10 AM    PMH:   has a past medical history of Basal cell carcinoma (07/05/2018), Basal cell carcinoma (05/17/2019), Cataract (03/2009), Chronic  cough (07/06/2018), Colon cancer screening (01/01/2022), Hyperlipidemia, Osteoporosis, Pain of right lower extremity (05/14/2019), Palpable purpura (HCC) (04/24/2016), and SCC (squamous cell carcinoma) (06/14/2023).  PSH:    Past Surgical History:  Procedure Laterality Date   EYE SURGERY     thumb surgery  1999   right    Current Outpatient Medications  Medication Sig Dispense Refill   apixaban (ELIQUIS) 5 MG TABS tablet Take 1 tablet (5 mg total) by mouth 2 (two) times daily. 60 tablet 3   furosemide (LASIX) 20 MG tablet Take 1 tablet (20 mg total) by mouth every other day. 45 tablet 3   metoprolol succinate (TOPROL-XL) 25 MG 24 hr tablet Take 1 tablet (25 mg total) by mouth daily. Take with or immediately following a meal. 90 tablet 3   Multiple Vitamins-Minerals (PRESERVISION AREDS PO) Take by mouth 2 (two) times daily.     potassium chloride SA (KLOR-CON M20) 20 MEQ tablet Take 1 tablet (20 mEq total) by mouth every other day. 45 tablet 3   acyclovir (ZOVIRAX) 400 MG tablet Take 400 mg by mouth 2 (two) times daily. (Patient not taking: Reported on 07/04/2023)  99   No current facility-administered medications for this visit.    Allergies:   Meperidine   Social History:  The patient  reports that he has never smoked. He has never used smokeless tobacco. He reports that he does not drink alcohol and does not use drugs.   Family History:   family history includes Alcohol abuse in his brother; Arthritis in his father; Cancer in his paternal grandfather; Prostate  cancer in his brother; Sleep apnea in his father.    Review of Systems: Review of Systems  Constitutional: Negative.   HENT: Negative.    Respiratory: Negative.    Cardiovascular: Negative.   Gastrointestinal: Negative.   Musculoskeletal: Negative.   Neurological: Negative.   Psychiatric/Behavioral: Negative.    All other systems reviewed and are negative.  PHYSICAL EXAM: VS:  BP 136/70 (BP Location: Right Arm,  Patient Position: Sitting, Cuff Size: Normal)   Pulse 78   Ht 5' 6.5" (1.689 m)   Wt 146 lb 6 oz (66.4 kg)   SpO2 93%   BMI 23.27 kg/m  , BMI Body mass index is 23.27 kg/m. GEN: Well nourished, well developed, in no acute distress HEENT: normal Neck: no JVD, carotid bruits, or masses Cardiac: RRR; no murmurs, rubs, or gallops,no edema  Respiratory:  clear to auscultation bilaterally, normal work of breathing GI: soft, nontender, nondistended, + BS MS: no deformity or atrophy Skin: warm and dry, no rash Neuro:  Strength and sensation are intact Psych: euthymic mood, full affect  Recent Labs: 01/13/2023: ALT 11; Hemoglobin 14.1; Platelets 291.0 05/31/2023: BUN 15; Creatinine, Ser 0.89; Potassium 4.2; Pro B Natriuretic peptide (BNP) 231.0; Sodium 141    Lipid Panel Lab Results  Component Value Date   CHOL 181 01/13/2023   HDL 70.00 01/13/2023   LDLCALC 96 01/13/2023   TRIG 79.0 01/13/2023    Wt Readings from Last 3 Encounters:  07/04/23 146 lb 6 oz (66.4 kg)  05/31/23 150 lb (68 kg)  05/24/23 150 lb (68 kg)     ASSESSMENT AND PLAN:  Problem List Items Addressed This Visit       Cardiology Problems   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   Relevant Medications   furosemide (LASIX) 20 MG tablet   metoprolol succinate (TOPROL-XL) 25 MG 24 hr tablet   apixaban (ELIQUIS) 5 MG TABS tablet   Pulmonary hypertension, unspecified (HCC)   Relevant Medications   furosemide (LASIX) 20 MG tablet   metoprolol succinate (TOPROL-XL) 25 MG 24 hr tablet   apixaban (ELIQUIS) 5 MG TABS tablet   Other Relevant Orders   EKG 12-Lead (Completed)     Other   Elevated brain natriuretic peptide (BNP) level   Bilateral lower extremity edema   Relevant Orders   EKG 12-Lead (Completed)   Other Visit Diagnoses       Persistent atrial fibrillation (HCC)    -  Primary   Relevant Medications   furosemide (LASIX) 20 MG tablet   metoprolol succinate (TOPROL-XL) 25 MG 24 hr tablet    apixaban (ELIQUIS) 5 MG TABS tablet      Persistent atrial fibrillation Timing of onset unclear, possibly January 2025 when leg swelling started Reports he feels well, denies significant shortness of breath but has not been doing his regular exercise program through January and February -Recommended he start Eliquis 5 twice daily for stroke prevention -Start metoprolol succinate 25 daily for rate control -Start Lasix 20 mg every other day with potassium 20 every other day for leg swelling, fluid retention, elevated right heart pressures on echo with elevated BNP -Recommend follow-up in 30 days for consideration of cardioversion versus rate control -I suspect it will be a challenge to maintain normal sinus rhythm given severely dilated left atrium, antiarrhythmic medications may be needed  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 [facility] [place], [place] [phone]

## 2023-07-04 ENCOUNTER — Ambulatory Visit: Payer: Medicare Other | Attending: Cardiovascular Disease | Admitting: Cardiovascular Disease

## 2023-07-04 ENCOUNTER — Encounter: Payer: Self-pay | Admitting: Cardiovascular Disease

## 2023-07-04 VITALS — BP 136/70 | HR 78 | Ht 66.5 in | Wt 146.4 lb

## 2023-07-04 DIAGNOSIS — I517 Cardiomegaly: Secondary | ICD-10-CM

## 2023-07-04 DIAGNOSIS — I4819 Other persistent atrial fibrillation: Secondary | ICD-10-CM

## 2023-07-04 DIAGNOSIS — R600 Localized edema: Secondary | ICD-10-CM | POA: Diagnosis present

## 2023-07-04 DIAGNOSIS — R7989 Other specified abnormal findings of blood chemistry: Secondary | ICD-10-CM

## 2023-07-04 DIAGNOSIS — I272 Pulmonary hypertension, unspecified: Secondary | ICD-10-CM | POA: Diagnosis present

## 2023-07-04 MED ORDER — FUROSEMIDE 20 MG PO TABS
20.0000 mg | ORAL_TABLET | ORAL | 3 refills | Status: AC
Start: 2023-07-04 — End: ?

## 2023-07-04 MED ORDER — POTASSIUM CHLORIDE CRYS ER 20 MEQ PO TBCR: 20.0000 meq | EXTENDED_RELEASE_TABLET | ORAL | 3 refills | Status: AC

## 2023-07-04 MED ORDER — METOPROLOL SUCCINATE ER 25 MG PO TB24: 25.0000 mg | ORAL_TABLET | Freq: Every day | ORAL | 3 refills | Status: DC

## 2023-07-04 MED ORDER — APIXABAN 5 MG PO TABS: 5.0000 mg | ORAL_TABLET | Freq: Two times a day (BID) | ORAL | 3 refills | Status: DC

## 2023-07-04 NOTE — Patient Instructions (Addendum)
 Medication Instructions:  Please start lasix 20 mg every other day with 20 Meq potassium every other day Please start metoprolol succinate 25 mg daily Please start eliquis 5 mg twice a day for stroke prevention  If you need a refill on your cardiac medications before your next appointment, please call your pharmacy.   Lab work: No new labs needed  Testing/Procedures: No new testing needed  Follow-Up: At [facility], you and your health needs are our priority.  As part of our continuing mission to provide you with exceptional heart care, we have created designated Provider Care Teams.  These Care Teams include your primary Cardiologist (physician) and Advanced Practice Providers (APPs -  Physician Assistants and Nurse Practitioners) who all work together to provide you with the care you need, when you need it.  You will need a follow up appointment in 1 month  Providers on your designated Care Team:   Nicolasa Ducking, NP Eula Listen, PA-C Cadence Fransico Michael, [place]  COVID-19 Vaccine Information can be found at: [email or web address] For questions related to vaccine distribution or appointments, please email [email or web address] or call [phone].

## 2023-07-12 ENCOUNTER — Ambulatory Visit: Payer: Self-pay | Admitting: Primary Care

## 2023-07-12 ENCOUNTER — Encounter: Payer: Self-pay | Admitting: Dermatology

## 2023-07-12 ENCOUNTER — Encounter (INDEPENDENT_AMBULATORY_CARE_PROVIDER_SITE_OTHER): Payer: Self-pay

## 2023-07-12 NOTE — Telephone Encounter (Signed)
  Chief Complaint: sore Symptoms: pinhole sore Frequency: 3 days Pertinent Negatives: Patient denies redness, swelling, streaking fever, bleeding Disposition: [] ED /[] Urgent Care (no appt availability in office) / [x] Appointment(In office/virtual)/ []  [place] [facility]/ [] Home Care/ [] Refused Recommended Disposition /[] [facility]/ []  Follow-up with PCP Additional Notes: Patient's wife, Mrs. Colao, calls with patient present reporting pinhole size sore on abdomen near naval x 3 days. She reports it had a spot of blood on it but is not bleeding now. Per protocol, home care appropriate. Care advice reviewed, patient verbalized understanding- caller requesting eval by PCP tomorrow, states she would feel more comfortable if a provider looked at it. No availability with PCP, scheduled with alt provider in clinic for 07/13/23 @ 0940. Alerting PCP for review.     Copied from CRM [id number]. Topic: Clinical - Red Word Triage >> Jul 12, 2023  7:33 AM Theodis Sato wrote: Red Word that prompted transfer to Nurse Triage: Very red spot on stomach with bleeding. Reason for Disposition  1 or 2 small sores  Answer Assessment - Initial Assessment Questions 1. APPEARANCE of RASH: "Describe the rash."      Red, one spot, like a pinhole 2. LOCATION: "Where is the rash located?"      Close to naval 3. NUMBER: "How many spots are there?"      One 4. SIZE: "How big are the spots?" (Inches, centimeters or compare to size of a coin)      Just smaller than a pinhole 5. ONSET: "When did the rash start?"      3 days 6. ITCHING: "Does the rash itch?" If Yes, ask: "How bad is the itch?"  (Scale 0-10; or none, mild, moderate, severe)     Denies 7. PAIN: "Does the rash hurt?" If Yes, ask: "How bad is the pain?"  (Scale 0-10; or none, mild, moderate, severe)    - NONE (0): no pain    - MILD (1-3): doesn't interfere with normal activities     - MODERATE (4-7): interferes with normal activities or awakens  from sleep     - SEVERE (8-10): excruciating pain, unable to do any normal activities     Denies 8. OTHER SYMPTOMS: "Do you have any other symptoms?" (e.g., fever)     Denies  Answer Assessment - Initial Assessment Questions 1. APPEARANCE of SORES: "What do the sores look like?"     Red pinhole 2. NUMBER: "How many sores are there?"     One 3. SIZE: "How big is the largest sore?"     Pin hole 4. LOCATION: "Where are the sores located?"     Near naval 5. ONSET: "When did the sores begin?"     3 days 6. TENDER: "Does it hurt when you touch it?"  (Scale 1-10; or mild, moderate, severe)      Denies 7. CAUSE: "What do you think is causing the sores?"     Unsure 8. OTHER SYMPTOMS: "Do you have any other symptoms?" (e.g., fever, new weakness)     Denies  Protocols used: Rash or Redness - Localized-A-AH, Sores-A-AH

## 2023-07-12 NOTE — Telephone Encounter (Signed)
Noted. Will see pt.

## 2023-07-13 ENCOUNTER — Encounter: Payer: Self-pay | Admitting: Family Medicine

## 2023-07-13 ENCOUNTER — Ambulatory Visit (INDEPENDENT_AMBULATORY_CARE_PROVIDER_SITE_OTHER): Admitting: Family Medicine

## 2023-07-13 ENCOUNTER — Encounter: Payer: Self-pay | Admitting: Cardiovascular Disease

## 2023-07-13 VITALS — BP 124/62 | HR 85 | Temp 97.5°F | Ht 66.5 in | Wt 150.4 lb

## 2023-07-13 DIAGNOSIS — L989 Disorder of the skin and subcutaneous tissue, unspecified: Secondary | ICD-10-CM | POA: Diagnosis not present

## 2023-07-13 NOTE — Progress Notes (Signed)
 Patient ID: Bryan Avila, male    DOB: December 27, 1940, 83 y.o.   MRN: [id number]  This visit was conducted in person.  BP 124/62   Pulse 85   Temp (!) 97.5 F (36.4 C) (Oral)   Ht 5' 6.5" (1.689 m)   Wt 150 lb 6.4 oz (68.2 kg)   SpO2 95%   BMI 23.91 kg/m    CC:  Chief Complaint  Patient presents with   Sore on Stomach    Patient states that its been there since Sunday. It bleed a little bit on Sunday. Has some redness to it.      Subjective:   HPI: Bryan Avila is a 83 y.o. male patient of Mayra Reel, NP with history of  HSV presenting on 07/13/2023 for Sore on Stomach (Patient states that its been there since Sunday. It bleed a little bit on Sunday. Has some redness to it.  )   I saw patient for possible subcutaneous cyst versus basal/squamous cell carcinoma with associated superinfection/cellulitis May 10, 2023.  Treated with Keflex at that time.  Recommended at that time he follow-up with dermatology for removal.  Saw dermatology June 14, 2023.Marland Kitchen  Had skin lesion removed from right popliteal fossa Results returned showing squamous cell carcinoma. Scheduled for full excision.   Today he reports new onset sore on anterior abdomen x 4-5 days.  Area mildly itchy.  Bled some... more than expected (still small amount) given on anticoagulant for newly diagnosed atrial fibrillation. Stopped bleeding with pressure   Before then had not noticed it.   He has not treated it with anything.   Relevant past medical, surgical, family and social history reviewed and updated as indicated. Interim medical history since our last visit reviewed. Allergies and medications reviewed and updated. Outpatient Medications Prior to Visit  Medication Sig Dispense Refill   acyclovir (ZOVIRAX) 400 MG tablet Take 400 mg by mouth 2 (two) times daily.  99   apixaban (ELIQUIS) 5 MG TABS tablet Take 1 tablet (5 mg total) by mouth 2 (two) times daily. 60 tablet 3   furosemide (LASIX) 20 MG tablet  Take 1 tablet (20 mg total) by mouth every other day. 45 tablet 3   metoprolol succinate (TOPROL-XL) 25 MG 24 hr tablet Take 1 tablet (25 mg total) by mouth daily. Take with or immediately following a meal. 90 tablet 3   Multiple Vitamins-Minerals (PRESERVISION AREDS PO) Take by mouth 2 (two) times daily.     potassium chloride SA (KLOR-CON M20) 20 MEQ tablet Take 1 tablet (20 mEq total) by mouth every other day. 45 tablet 3   timolol (BETIMOL) 0.5 % ophthalmic solution Place 1 drop into the right eye daily.     No facility-administered medications prior to visit.     Per HPI unless specifically indicated in ROS section below Review of Systems  Constitutional:  Negative for fatigue and fever.  HENT:  Negative for ear pain.   Eyes:  Negative for pain.  Respiratory:  Negative for cough and shortness of breath.   Cardiovascular:  Negative for chest pain, palpitations and leg swelling.  Gastrointestinal:  Negative for abdominal pain.  Genitourinary:  Negative for dysuria.  Musculoskeletal:  Negative for arthralgias.  Neurological:  Negative for syncope, light-headedness and headaches.  Psychiatric/Behavioral:  Negative for dysphoric mood.    Objective:  BP 124/62   Pulse 85   Temp (!) 97.5 F (36.4 C) (Oral)   Ht 5' 6.5" (1.689 m)  Wt 150 lb 6.4 oz (68.2 kg)   SpO2 95%   BMI 23.91 kg/m   Wt Readings from Last 3 Encounters:  07/13/23 150 lb 6.4 oz (68.2 kg)  07/04/23 146 lb 6 oz (66.4 kg)  05/31/23 150 lb (68 kg)      Physical Exam Constitutional:      Appearance: He is well-developed.  HENT:     Head: Normocephalic.     Right Ear: Hearing normal.     Left Ear: Hearing normal.     Nose: Nose normal.  Neck:     Thyroid: No thyroid mass or thyromegaly.     Vascular: No carotid bruit.     Trachea: Trachea normal.  Cardiovascular:     Rate and Rhythm: Normal rate and regular rhythm.     Pulses: Normal pulses.     Heart sounds: Heart sounds not distant. No murmur heard.     No friction rub. No gallop.     Comments: No peripheral edema Pulmonary:     Effort: Pulmonary effort is normal. No respiratory distress.     Breath sounds: Normal breath sounds.  Skin:    General: Skin is warm and dry.     Findings: No rash.     Comments: Multiple cherry hemangiomas on abdomen Area of concern it was a small 2 mm scab with no surrounding redness, nontender, no fluctuance, no pustules or vesicle  Psychiatric:        Speech: Speech normal.        Behavior: Behavior normal.        Thought Content: Thought content normal.       Results for orders placed or performed in visit on 06/27/23  ECHOCARDIOGRAM COMPLETE   Collection Time: 06/27/23  9:52 AM  Result Value Ref Range   S' Lateral 3.40 cm   Est EF 45 - 50%     Assessment and Plan  Skin lesion Assessment & Plan: Acute, small scratch with scab. On Eliquis for anticoagulation. No associated cellulitis or abscess. No associated rash.  Area hemostatic and healing well.  Recommended washing daily with warm soapy water and treating with topical antibiotic to avoid infection.  Return and ER precautions provided.     No follow-ups on file.   Kerby Nora, MD

## 2023-07-13 NOTE — Assessment & Plan Note (Signed)
 Acute, small scratch with scab. On Eliquis for anticoagulation. No associated cellulitis or abscess. No associated rash.  Area hemostatic and healing well.  Recommended washing daily with warm soapy water and treating with topical antibiotic to avoid infection.  Return and ER precautions provided.

## 2023-07-14 ENCOUNTER — Encounter (INDEPENDENT_AMBULATORY_CARE_PROVIDER_SITE_OTHER): Payer: Self-pay | Admitting: Nurse Practitioner

## 2023-07-14 ENCOUNTER — Ambulatory Visit (INDEPENDENT_AMBULATORY_CARE_PROVIDER_SITE_OTHER): Payer: Medicare Other

## 2023-07-14 ENCOUNTER — Telehealth: Payer: Self-pay

## 2023-07-14 DIAGNOSIS — I8393 Asymptomatic varicose veins of bilateral lower extremities: Secondary | ICD-10-CM

## 2023-07-14 DIAGNOSIS — R600 Localized edema: Secondary | ICD-10-CM

## 2023-07-14 NOTE — Telephone Encounter (Signed)
 Mrs Malson called asking if patient will need cardiac clearance before his scheduled surgery appointment, I discussed with patient wife she could if she would like to but we do not require cardiac clearance for this type of surgery he is having here, patient wife said she will send Dr Katrinka Blazing a mychart message requesting a order be sent to patients cardiologist for clearance to have surgery here.

## 2023-07-18 ENCOUNTER — Telehealth: Payer: Self-pay | Admitting: Dermatology

## 2023-07-18 NOTE — Telephone Encounter (Signed)
 From: Antonieta Iba, MD Sent: 07/15/2023   1:38 PM EDT To: Elie Goody, MD Subject: RE: Clearance for skin surgery on right popl*  I think he should be fine for surgery Thanks for sharing Dossie Arbour ----- Message ----- From: Elie Goody, MD Sent: 07/14/2023   1:08 PM EST To: Antonieta Iba, MD Subject: Clearance for skin surgery on right poplitea*  Hi Dr Mariah Milling,  I hope you're doing well. I found a squamous cell carcinoma on your patient's right popliteal fossa and I plan to excise it. We use local anesthesia with lidocaine and perform the procedure in our outpatient clinic. We use epinephrine in the lidocaine and electrodesiccation for hemostasis. Mr Reh and his wife want clearance from you before proceeding with surgery. I assured them that they should continue their anticoagulants as prescribed and not hold anything before surgery. Do you have any concerns about Mr Balch and surgery?  Thank you, Herbert Deaner, [facility]

## 2023-07-22 ENCOUNTER — Ambulatory Visit (INDEPENDENT_AMBULATORY_CARE_PROVIDER_SITE_OTHER): Payer: Self-pay | Admitting: Nurse Practitioner

## 2023-07-22 ENCOUNTER — Encounter (INDEPENDENT_AMBULATORY_CARE_PROVIDER_SITE_OTHER): Payer: Self-pay | Admitting: Nurse Practitioner

## 2023-07-22 VITALS — BP 135/81 | HR 74 | Resp 18 | Ht 66.5 in | Wt 147.6 lb

## 2023-07-22 DIAGNOSIS — I8311 Varicose veins of right lower extremity with inflammation: Secondary | ICD-10-CM | POA: Diagnosis not present

## 2023-07-22 DIAGNOSIS — E7800 Pure hypercholesterolemia, unspecified: Secondary | ICD-10-CM

## 2023-07-22 DIAGNOSIS — I8312 Varicose veins of left lower extremity with inflammation: Secondary | ICD-10-CM

## 2023-07-25 ENCOUNTER — Encounter (INDEPENDENT_AMBULATORY_CARE_PROVIDER_SITE_OTHER): Payer: Self-pay

## 2023-07-25 NOTE — Progress Notes (Signed)
 Subjective:    Patient ID: Bryan Avila, male    DOB: December 05, 1940, 83 y.o.   MRN: [id number] Chief Complaint  Patient presents with   Follow-up    Spider veins of both lower extremities    The patient presents today for evaluation of bilateral lower extremity edema and varicosities.  He notes that he has had swelling which has been helpful with treatment and utilizing medical grade compression, elevation and activity.  He has been doing this for the last several weeks.  However in addition to the swelling he has also had bleeding from varicosities on his right lower extremity.  This was a spontaneous bleed.  It occurred without scratch or trauma in the morning and then again after the patient shower.  Currently he denies any active open wounds or ulcerations.  He has no additional or previous history of treatment of his varicosities.  Today noninvasive study showed no evidence of DVT or superficial phlebitis bilaterally.  He has evidence of deep venous insufficiency bilaterally.  He has evidence of deep venous insufficiency bilaterally as well as superficial venous reflux in the great saphenous veins bilaterally.    Review of Systems  Cardiovascular:  Positive for leg swelling.  Hematological:  Bruises/bleeds easily.  All other systems reviewed and are negative.      Objective:   Physical Exam Vitals reviewed.  HENT:     Head: Normocephalic.  Cardiovascular:     Rate and Rhythm: Normal rate.     Pulses: Normal pulses.  Pulmonary:     Effort: Pulmonary effort is normal.  Musculoskeletal:     Right lower leg: Edema present.     Left lower leg: Edema present.  Skin:    General: Skin is warm and dry.  Neurological:     Mental Status: He is alert and oriented to person, place, and time.  Psychiatric:        Mood and Affect: Mood normal.        Behavior: Behavior normal.        Thought Content: Thought content normal.        Judgment: Judgment normal.     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   right popliteal fossa, schedule excision    Social History   Socioeconomic History   Marital status: Married    Spouse name: Not on file   Number of children: 1   Years of education: Not on file   Highest education level: Not on file  Occupational History   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   Would not want Tube Feeds.   Does not have a living will or HPOA.   Social Drivers of Corporate investment banker Strain: Low Risk  (01/01/2023)   Overall Financial Resource Strain (CARDIA)    Difficulty of Paying Living Expenses: Not hard at all  Food Insecurity: No Food Insecurity (01/01/2023)   Hunger Vital Sign    Worried About Running Out of Food in the Last Year: Never true    Ran Out of Food in the Last Year: Never true  Transportation Needs: No Transportation Needs (01/01/2023)   PRAPARE - Administrator, Civil Service (Medical): No    Lack of Transportation (Non-Medical): No  Physical Activity: Sufficiently Active (01/01/2023)   Exercise Vital Sign    Days of Exercise per Week: 6  days    Minutes of Exercise per Session: 30 min  Stress: No Stress Concern Present (01/01/2023)   [name] of Occupational Health - Occupational Stress Questionnaire    Feeling of Stress : Not at all  Social Connections: Socially Integrated (01/01/2023)   Social Connection and Isolation Panel [NHANES]    Frequency of Communication with Friends and Family: Twice a week    Frequency of Social Gatherings with Friends and Family: Twice a week    Attends Religious Services: More than 4 times per year    Active Member of [employer] or Organizations: Yes    Attends Banker Meetings: Never    Marital Status: Married  Catering manager Violence: Not At Risk (01/04/2022)   Humiliation, Afraid, Rape, and Kick questionnaire    Fear of Current or Ex-Partner: No    Emotionally Abused: No    Physically Abused: No    Sexually Abused: No    Past Surgical History:  Procedure Laterality Date   EYE SURGERY     thumb surgery  1999   right    Family History  Problem Relation Age of Onset   Arthritis Father    Sleep apnea Father    Alcohol abuse Brother    Prostate cancer Brother    Cancer Paternal Grandfather        prostate    Allergies  Allergen Reactions   Meperidine Other (See Comments) and Nausea Only    "I almost died from that."-Severe bradycardia Other reaction(s): Unknown DECREASED BLOOD PRESSURE "I almost died from that."-Severe bradycardia        Latest Ref Rng & Units 01/13/2023   10:34 AM 01/27/2021   11:04 AM 12/23/2020    8:29 AM  CBC  WBC 4.0 - 10.5 K/uL 6.9  18.6  6.2   Hemoglobin 13.0 - 17.0 g/dL 16.1  09.6  04.5   Hematocrit 39.0 - 52.0 % 43.6  42.7  43.3   Platelets 150.0 - 400.0 K/uL 291.0  250  265.0       CMP     Component Value Date/Time   NA 141 05/31/2023 0945   K 4.2 05/31/2023 0945   CL 105 05/31/2023 0945   CO2 29 05/31/2023 0945   GLUCOSE 88 05/31/2023 0945   BUN 15 05/31/2023 0945   CREATININE 0.89 05/31/2023 0945   CALCIUM 9.3  05/31/2023 0945   PROT 6.5 01/13/2023 1034   ALBUMIN 3.7 01/13/2023 1034   AST 15 01/13/2023 1034   ALT 11 01/13/2023 1034   ALKPHOS  71 01/13/2023 1034   BILITOT 0.6 01/13/2023 1034   GFR 79.88 05/31/2023 0945   GFRNONAA >60 01/27/2021 1104     No results found.     Assessment & Plan:   1. 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  He has worn medical grade compression stockings for at least the last 4 weeks in addition to elevation and exercising regularly.  In order to treat his varicosities we have discussed several options with the patient.  The first is continuing with conservative therapy.  The second option that we discussed was of endovenous laser ablation to treat the varicosities, which may also help with his swelling.  We discussed the procedure in detail including the risks, benefits and alternatives as well as the associated recovery timeframe.  Because the patient has had bleeding from his varicosities we also discussed sclerotherapy of the area to try to help and reduce possible recurrent bleeding episodes.  The patient will discuss this with wife and they will contact us once the decision has been made.  2. HYPERCHOLESTEROLEMIA, PURE Continue statin as ordered and reviewed, no changes at this time   Current Outpatient Medications on File Prior to Visit  Medication Sig Dispense Refill   acyclovir (ZOVIRAX) 400 MG tablet Take 400 mg by mouth 2 (two) times daily.  99   apixaban (ELIQUIS) 5 MG TABS tablet Take 1 tablet (5 mg total) by mouth 2 (two) times daily. 60 tablet 3   furosemide (LASIX) 20 MG tablet Take 1 tablet (20 mg total) by mouth every other day. 45 tablet 3   metoprolol succinate (TOPROL-XL) 25 MG 24 hr tablet Take 1 tablet (25 mg total) by mouth daily. Take with or immediately following a meal. 90 tablet 3   Multiple Vitamins-Minerals  (PRESERVISION AREDS PO) Take by mouth 2 (two) times daily.     potassium chloride SA (KLOR-CON M20) 20 MEQ tablet Take 1 tablet (20 mEq total) by mouth every other day. 45 tablet 3   timolol (BETIMOL) 0.5 % ophthalmic solution Place 1 drop into the right eye daily.     No current facility-administered medications on file prior to visit.    There are no Patient Instructions on file for this visit. No follow-ups on file.   Georgiana Spinner, NP

## 2023-08-01 ENCOUNTER — Ambulatory Visit: Payer: Medicare Other | Admitting: Student

## 2023-08-01 NOTE — Progress Notes (Unsigned)
 Cardiology Office Note    Date:  08/04/2023   ID:  Bryan Avila, DOB 11/27/1940, MRN [id number]  PCP:  Doreene Nest, NP  Cardiologist:  Julien Nordmann, MD  Electrophysiologist:  None   Chief Complaint: Follow up  History of Present Illness:   Bryan Avila is a 83 y.o. male with history of dilated cardiomyopathy with HFmrEF, pulmonary hypertension, persistent A-fib, moderate to severe tricuspid regurgitation, RBBB, and venous insufficiency who presents for follow-up of cardiomyopathy and Afib.  He was previously evaluated in 2012 for dizziness and RBBB with further testing deferred at that time.  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  More recently, he was evaluated at his PCP's office in 05/2023 for lower extremity swelling felt to be related to chronic venous insufficiency noted improvement in compression stockings and leg elevation.  BNP was obtained and found to be mildly elevated at 231.  Given this, he underwent echo in 06/2023 showed an EF of 45 to 50%, global hypokinesis, low normal RV systolic function with severely enlarged RV cavity size, moderately elevated RVSP estimated at 45.5 mmHg, severe biatrial enlargement, mild mitral regurgitation, moderate to severe tricuspid regurgitation, mild aortic insufficiency, and an estimated right atrial pressure of 15 mmHg.  During the study the patient was noted to be in A-fib.  He followed up with Dr. Mariah Milling on 07/04/2023 for persistent A-fib with unclear timing of onset, dilated cardiomyopathy, and valvular heart disease.  It was recommended he start apixaban along with metoprolol 25 mg daily and furosemide 20 mg every other day.  Lower extremity venous ultrasound in 07/2022 showed no evidence of DVT bilaterally with venous reflux noted bilaterally.  He comes in accompanied by his wife today and is without symptoms of angina or cardiac  decompensation.  No dyspnea, palpitations, presyncope, or syncope.  He does note some dizziness after eating foods higher in sugar.  Lower extremity swelling much improved following leg elevation, compression socks, and furosemide.  No progressive orthopnea or early satiety.  No falls or symptoms concerning for bleeding.  He is not sure he would want to move forward with cardioversion at this time.  His wife is concerned about Eliquis and upcoming dermatology skin biopsy.   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   apixaban (ELIQUIS) 5 MG TABS tablet Take 1 tablet (5 mg total) by mouth 2 (two) times daily.  furosemide (LASIX) 20 MG tablet Take 1 tablet (20 mg total) by mouth every other day.   losartan (COZAAR) 25 MG tablet Take 0.5 tablets (12.5 mg total) by mouth daily.   metoprolol succinate (TOPROL-XL) 25 MG 24 hr tablet Take 1 tablet (25 mg total) by mouth daily. Take with or immediately following a meal.   Multiple Vitamins-Minerals (PRESERVISION AREDS PO) Take by mouth 2 (two) times daily.   potassium chloride SA  (KLOR-CON M20) 20 MEQ tablet Take 1 tablet (20 mEq total) by mouth every other day.   timolol (BETIMOL) 0.5 % ophthalmic solution Place 1 drop into the right eye daily.    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   Would not want Tube Feeds.   Does not have a living will or HPOA.   Social Drivers of Corporate investment banker Strain: Low Risk  (01/01/2023)   Overall Financial Resource Strain (CARDIA)    Difficulty of Paying Living Expenses: Not hard at all  Food Insecurity: No Food Insecurity (01/01/2023)   Hunger Vital Sign    Worried About Running Out of Food in the Last Year: Never true    Ran Out of Food in the Last Year: Never true  Transportation Needs: No Transportation Needs (01/01/2023)   PRAPARE - Administrator, Civil Service (Medical): No    Lack of Transportation (Non-Medical): No  Physical Activity: Sufficiently Active (01/01/2023)   Exercise Vital Sign    Days of Exercise per Week: 6 days    Minutes of Exercise per Session: 30 min  Stress: No Stress Concern Present (01/01/2023)   [name] of Occupational Health - Occupational Stress Questionnaire    Feeling of Stress : Not at all  Social Connections: Socially Integrated (01/01/2023)   Social Connection and Isolation Panel [NHANES]    Frequency of Communication with Friends and  Family: Twice a week    Frequency of Social Gatherings with Friends and Family: Twice a week    Attends Religious Services: More than 4 times per year    Active Member of [employer] or Organizations: Yes    Attends Banker Meetings: Never    Marital Status: Married     Family History:  The patient's family history includes Alcohol abuse in his brother; Arthritis in his father; Cancer in his paternal grandfather; Prostate cancer in his brother; Sleep apnea in his father.  ROS:   12-point review of systems is negative unless otherwise noted in the HPI.   EKGs/Labs/Other Studies Reviewed:    Studies reviewed were summarized above. The additional studies were reviewed today:  2D echo 06/27/2023: 1. Left ventricular ejection fraction, by estimation, is 45 to 50%. The  left ventricle has mildly decreased function.  The left ventricle  demonstrates global hypokinesis. Left ventricular diastolic parameters are  indeterminate. The global longitudinal  strain is indeterminate.   2. Right ventricular systolic function is low normal. The right  ventricular size is severely enlarged. There is moderately elevated  pulmonary artery systolic pressure. The estimated right ventricular  systolic pressure is 45.5 mmHg.   3. Left atrial size was severely dilated.   4. Right atrial size was severely dilated.   5. The mitral valve is normal in structure. Mild mitral valve  regurgitation.   6. Tricuspid valve regurgitation is moderate to severe.   7. The aortic valve is tricuspid. Aortic valve regurgitation is mild.   8. The inferior vena cava is dilated in size with <50% respiratory  variability, suggesting right atrial pressure of 15 mmHg.  _________  Lower extremity venous reflux ultrasound 07/14/2023: Summary:  Right:  - No evidence of deep vein thrombosis seen in the right lower extremity,  from the common femoral through the popliteal veins.  - No evidence of superficial venous  thrombosis in the right lower  extremity.  - Venous reflux is noted in the right greater saphenous vein in the thigh.  - Venous reflux is noted in the right femoral vein.  - Venous reflux is noted in the right popliteal vein.  - Venous reflux is noted in the right short saphenous vein.    Left:  - No evidence of deep vein thrombosis seen in the left lower extremity,  from the common femoral through the popliteal veins.  - No evidence of superficial venous thrombosis in the left lower  extremity.  - No evidence of superficial venous reflux seen in the left short  saphenous vein.  - Venous reflux is noted in the left sapheno-femoral junction.  - Venous reflux is noted in the left greater saphenous vein in the thigh.  - Venous reflux is noted in the left greater saphenous vein in the calf.  - Venous reflux is noted in the left femoral vein.     EKG:  EKG is ordered today.  The EKG ordered today demonstrates A-fib, 69 bpm, RBBB, consistent with prior tracing  Recent Labs: 01/13/2023: ALT 11; Hemoglobin 14.1; Platelets 291.0 05/31/2023: BUN 15; Creatinine, Ser 0.89; Potassium 4.2; Pro B Natriuretic peptide (BNP) 231.0; Sodium 141  Recent Lipid Panel    Component Value Date/Time   CHOL 181 01/13/2023 1034   TRIG 79.0 01/13/2023 1034   HDL 70.00 01/13/2023 1034   CHOLHDL 3 01/13/2023 1034   VLDL 15.8 01/13/2023 1034   LDLCALC 96 01/13/2023 1034    PHYSICAL EXAM:    VS:  BP 118/68   Pulse 69   Ht 5' 6.5" (1.689 m)   Wt 152 lb (68.9 kg)   SpO2 96%   BMI 24.17 kg/m   BMI: Body mass index is 24.17 kg/m.  Physical Exam Vitals reviewed.  Constitutional:      Appearance: He is well-developed.  HENT:     Head: Normocephalic and atraumatic.  Eyes:     General:        Right eye: No discharge.        Left eye: No discharge.  Cardiovascular:     Rate and Rhythm: Normal rate. Rhythm irregularly irregular.     Heart sounds: S1 normal and S2 normal. Heart sounds not distant. No  midsystolic click and no opening snap. Murmur heard.     Systolic murmur is present with a grade of 2/6 at the upper left  sternal border.     No friction rub.  Pulmonary:     Effort: Pulmonary effort is normal. No respiratory distress.     Breath sounds: Normal breath sounds. No decreased breath sounds, wheezing, rhonchi or rales.  Chest:     Chest wall: No tenderness.  Musculoskeletal:     Cervical back: Normal range of motion.     Right lower leg: No edema.     Left lower leg: No edema.     Comments: Compression socks noted bilaterally.  Skin:    General: Skin is warm and dry.     Nails: There is no clubbing.  Neurological:     Mental Status: He is alert and oriented to person, place, and time.  Psychiatric:        Speech: Speech normal.        Behavior: Behavior normal.        Thought Content: Thought content normal.        Judgment: Judgment normal.     Wt Readings from Last 3 Encounters:  08/04/23 152 lb (68.9 kg)  07/22/23 147 lb 9.6 oz (67 kg)  07/13/23 150 lb 6.4 oz (68.2 kg)     ASSESSMENT & PLAN:   Persistent A-fib: He remains in A-fib of uncertain chronicity with controlled ventricular response and is asymptomatic.  He remains on Toprol-XL 25 mg.  We discussed rate versus rhythm control strategy, and in the context of his underlying cardiomyopathy.  He would like to pursue rate control strategy and optimization of his cardiomyopathy initially with deferment for potential rhythm control strategy thereafter.  Maintaining sinus rhythm may be challenging, particularly without antiarrhythmic therapy, given dilated left atrium measuring 52 mm.  Check CBC, TSH, magnesium, and BMP.  CHA2DS2-VASc at least 3 (CHF, age x 2).  He remains on apixaban 5 mg twice daily and does not meet reduced dosing criteria.  No falls or symptoms concerning for bleeding.  Patient and wife were made aware of increased stroke risk with interruption of anticoagulation for dermatological  procedure.  Dilated HFmrEF and pulmonary hypertension: Unclear etiology at this time.  He appears euvolemic and well compensated.  Cannot exclude some degree of rhythmic contributing to cardiomyopathy.  Optimize GDMT with addition of losartan 12.5 mg with continuation of Toprol-XL 25 mg and furosemide 20 mg every other day with KCl every other day.  Further escalate GDMT with potential transition to ARNI and/or addition of SGLT2 inhibitor/MRA moving forward as able.  Check BMP today and again 1 week after initiating ARB.  Anticipate follow-up limited echo in several months time on maximally tolerated GDMT to reevaluate cardiomyopathy.  Will need ischemic testing moving forward.  Moderate to severe tricuspid regurgitation: Pharmacotherapy as outlined above.  Anticipate repeat limited echo in several months time.  Chronic venous insufficiency: Much improved with leg elevation and compression socks.  Followed by vascular surgery.  Sleep disordered breathing: Refer to pulmonology for consideration of sleep study.    Disposition: F/u with Dr. Mariah Milling or an APP in 2 months.   Medication Adjustments/Labs and Tests Ordered: Current medicines are reviewed at length with the patient today.  Concerns regarding medicines are outlined above. Medication changes, Labs and Tests ordered today are summarized above and listed in the Patient Instructions accessible in Encounters.   Signed, Eula Listen, PA-C 08/04/2023 1:00 PM     [facility] [place] [address] [place], [place] [address] [phone]

## 2023-08-04 ENCOUNTER — Ambulatory Visit: Payer: Medicare Other | Attending: Physician Assistant | Admitting: Physician Assistant

## 2023-08-04 ENCOUNTER — Encounter: Payer: Self-pay | Admitting: Physician Assistant

## 2023-08-04 VITALS — BP 118/68 | HR 69 | Ht 66.5 in | Wt 152.0 lb

## 2023-08-04 DIAGNOSIS — I420 Dilated cardiomyopathy: Secondary | ICD-10-CM | POA: Diagnosis present

## 2023-08-04 DIAGNOSIS — I4819 Other persistent atrial fibrillation: Secondary | ICD-10-CM

## 2023-08-04 DIAGNOSIS — I5022 Chronic systolic (congestive) heart failure: Secondary | ICD-10-CM | POA: Diagnosis present

## 2023-08-04 DIAGNOSIS — I872 Venous insufficiency (chronic) (peripheral): Secondary | ICD-10-CM | POA: Diagnosis present

## 2023-08-04 DIAGNOSIS — G4730 Sleep apnea, unspecified: Secondary | ICD-10-CM

## 2023-08-04 DIAGNOSIS — Z79899 Other long term (current) drug therapy: Secondary | ICD-10-CM

## 2023-08-04 DIAGNOSIS — I071 Rheumatic tricuspid insufficiency: Secondary | ICD-10-CM | POA: Diagnosis present

## 2023-08-04 MED ORDER — LOSARTAN POTASSIUM 25 MG PO TABS
12.5000 mg | ORAL_TABLET | Freq: Every day | ORAL | 3 refills | Status: AC
Start: 2023-08-04 — End: ?

## 2023-08-04 NOTE — Patient Instructions (Signed)
 Medication Instructions:  Your physician recommends the following medication changes.  START TAKING: Losartan 12.5 mg daily  *If you need a refill on your cardiac medications before your next appointment, please call your pharmacy*   Lab Work: Your provider would like for you to have following labs drawn today BMeT, CC, TSH, and Mag level.    Your provider would like for you to return in 1 week to have the following labs drawn: BMeT.   Please go to [facility] [address] ([facility] [address], [place] [address] You do not need an appointment.  They are open from 8 am- 4:30 pm.  Lunch from 1:00 pm- 2:00 pm You DO NOT need to be fasting.   Follow-Up: At [facility], you and your health needs are our priority.  As part of our continuing mission to provide you with exceptional heart care, we have created designated Provider Care Teams.  These Care Teams include your primary Cardiologist (physician) and Advanced Practice Providers (APPs -  Physician Assistants and Nurse Practitioners) who all work together to provide you with the care you need, when you need it.   Your next appointment:   2 month(s)  Provider:   You may see Julien Nordmann, MD or Eula Listen, PA-C

## 2023-08-05 LAB — BASIC METABOLIC PANEL WITH GFR
BUN/Creatinine Ratio: 19 (ref 10–24)
BUN: 18 mg/dL (ref 8–27)
CO2: 25 mmol/L (ref 20–29)
Calcium: 9.8 mg/dL (ref 8.6–10.2)
Chloride: 103 mmol/L (ref 96–106)
Creatinine, Ser: 0.94 mg/dL (ref 0.76–1.27)
Glucose: 85 mg/dL (ref 70–99)
Potassium: 4.6 mmol/L (ref 3.5–5.2)
Sodium: 141 mmol/L (ref 134–144)
eGFR: 81 mL/min/{1.73_m2} (ref >59)

## 2023-08-05 LAB — CBC
Hematocrit: 41.5 % (ref 37.5–51.0)
Hemoglobin: 13.8 g/dL (ref 13.0–17.7)
MCH: 31.3 pg (ref 26.6–33.0)
MCHC: 33.3 g/dL (ref 31.5–35.7)
MCV: 94 fL (ref 79–97)
Platelets: 259 10*3/uL (ref 150–450)
RBC: 4.41 x10E6/uL (ref 4.14–5.80)
RDW: 12.7 % (ref 11.6–15.4)
WBC: 7.9 10*3/uL (ref 3.4–10.8)

## 2023-08-05 LAB — TSH: TSH: 2.43 u[IU]/mL (ref 0.450–4.500)

## 2023-08-05 LAB — MAGNESIUM: Magnesium: 2.3 mg/dL (ref 1.6–2.3)

## 2023-08-08 ENCOUNTER — Ambulatory Visit: Admitting: Sleep Medicine

## 2023-08-08 ENCOUNTER — Encounter: Payer: Self-pay | Admitting: Sleep Medicine

## 2023-08-08 VITALS — BP 110/72 | HR 64 | Temp 97.1°F | Ht 66.5 in | Wt 152.0 lb

## 2023-08-08 DIAGNOSIS — I4891 Unspecified atrial fibrillation: Secondary | ICD-10-CM

## 2023-08-08 DIAGNOSIS — I10 Essential (primary) hypertension: Secondary | ICD-10-CM | POA: Diagnosis not present

## 2023-08-08 DIAGNOSIS — G4733 Obstructive sleep apnea (adult) (pediatric): Secondary | ICD-10-CM

## 2023-08-08 NOTE — Progress Notes (Signed)
 Name:Bryan Avila MRN: [id number] DOB: August 14, 1940   CHIEF COMPLAINT:  EXCESSIVE DAYTIME SLEEPINESS   HISTORY OF PRESENT ILLNESS:  Mr. Liford is a 83 y.o. w/ a h/o HTN and atrial fibrillation who presents for c/o snoring and occasional daytime sleepiness which has been present for several years. Reports nocturnal awakenings due to nocturia, however does not have difficulty falling back to sleep. Denies any significant weight changes. Denies morning headaches, RLS symptoms, dream enactment, cataplexy, hypnagogic or hypnapompic hallucinations. Reports a family history of sleep apnea. Denies drowsy driving. Drinks 2 cups of coffee daily, denies alcohol, tobacco or illicit drug use.   Bedtime 9 pm Sleep onset 10 mins Rise time 5:30 am   EPWORTH SLEEP SCORE 3     No data to display         PAST MEDICAL HISTORY :   has a past medical history of Basal cell carcinoma (07/05/2018), Basal cell carcinoma (05/17/2019), Cataract (03/2009), Chronic cough (07/06/2018), Colon cancer screening (01/01/2022), Hyperlipidemia, Osteoporosis, Pain of right lower extremity (05/14/2019), Palpable purpura (HCC) (04/24/2016), and SCC (squamous cell carcinoma) (06/14/2023).  has a past surgical history that includes thumb surgery (1999) and Eye surgery. Prior to Admission medications   Medication Sig Start Date End Date Taking? Authorizing Provider  acyclovir (ZOVIRAX) 400 MG tablet Take 400 mg by mouth 2 (two) times daily. 07/06/15  Yes [provider]  apixaban (ELIQUIS) 5 MG TABS tablet Take 1 tablet (5 mg total) by mouth 2 (two) times daily. 07/04/23  Yes Antonieta Iba, MD  furosemide (LASIX) 20 MG tablet Take 1 tablet (20 mg total) by mouth every other day. 07/04/23 10/02/23 Yes Gollan, Tollie Pizza, MD  losartan (COZAAR) 25 MG tablet Take 0.5 tablets (12.5 mg total) by mouth daily. 08/04/23 11/02/23 Yes Dunn, Raymon Mutton, PA-C  metoprolol succinate (TOPROL-XL) 25 MG 24 hr tablet Take 1 tablet (25 mg  total) by mouth daily. Take with or immediately following a meal. 07/04/23 10/02/23 Yes Gollan, Tollie Pizza, MD  Multiple Vitamins-Minerals (PRESERVISION AREDS PO) Take by mouth 2 (two) times daily.   Yes [provider]  potassium chloride SA (KLOR-CON M20) 20 MEQ tablet Take 1 tablet (20 mEq total) by mouth every other day. 07/04/23 10/02/23 Yes Gollan, Tollie Pizza, MD  timolol (BETIMOL) 0.5 % ophthalmic solution Place 1 drop into the right eye daily. 07/11/23  Yes [provider]   Allergies  Allergen Reactions   Meperidine Other (See Comments) and Nausea Only    "I almost died from that."-Severe bradycardia Other reaction(s): Unknown DECREASED BLOOD PRESSURE "I almost died from that."-Severe bradycardia     FAMILY HISTORY:  family history includes Alcohol abuse in his brother; Arthritis in his father; Cancer in his paternal grandfather; Prostate cancer in his brother; Sleep apnea in his father. SOCIAL HISTORY:  reports that he has never smoked. He has never used smokeless tobacco. He reports that he does not drink alcohol and does not use drugs.   Review of Systems:  Gen:  Denies  fever, sweats, chills weight loss  HEENT: Denies blurred vision, double vision, ear pain, eye pain, hearing loss, nose bleeds, sore throat Cardiac:  No dizziness, chest pain or heaviness, chest tightness,edema, No JVD Resp:   No cough, -sputum production, -shortness of breath,-wheezing, -hemoptysis,  Gi: Denies swallowing difficulty, stomach pain, nausea or vomiting, diarrhea, constipation, bowel incontinence Gu:  Denies bladder incontinence, burning urine Ext:   Denies Joint pain, stiffness or swelling Skin: Denies  skin rash, easy bruising or bleeding or hives Endoc:  Denies polyuria, polydipsia , polyphagia or weight change Psych:   Denies depression, insomnia or hallucinations  Other:  All other systems negative  VITAL SIGNS: BP 110/72 (BP Location: Right Arm, Cuff Size: Normal)   Pulse  64   Temp (!) 97.1 F (36.2 C)   Ht 5' 6.5" (1.689 m)   Wt 152 lb (68.9 kg)   SpO2 96%   BMI 24.17 kg/m     Physical Examination:   General Appearance: No distress  EYES PERRLA, EOM intact.   NECK Supple, No JVD Pulmonary: normal breath sounds, No wheezing.  CardiovascularNormal S1,S2.  No m/r/g.   Abdomen: Benign, Soft, non-tender. Skin:   warm, no rashes, no ecchymosis  Extremities: normal, no cyanosis, clubbing. Neuro:without focal findings,  speech normal  PSYCHIATRIC: Mood, affect within normal limits.   ASSESSMENT AND PLAN  OSA I suspect that OSA is likely present due to clinical presentation. Discussed the consequences of untreated sleep apnea. Advised not to drive drowsy for safety of patient and others. Will complete further evaluation with a home sleep study and follow up to review results.    HTN Stable, on current management. Following with PCP.   Atrial fibrillation Stable, on current management.    MEDICATION ADJUSTMENTS/LABS AND TESTS ORDERED: Recommend Sleep Study   Patient  satisfied with Plan of action and management. All questions answered  Follow up to review HST results and treatment plan.   I spent a total of 48 minutes reviewing chart data, face-to-face evaluation with the patient, counseling and coordination of care as detailed above.    Tempie Hoist, M.D.  Sleep Medicine [facility]

## 2023-08-08 NOTE — Patient Instructions (Signed)
 Marland Kitchen

## 2023-08-10 ENCOUNTER — Ambulatory Visit (INDEPENDENT_AMBULATORY_CARE_PROVIDER_SITE_OTHER): Payer: Medicare Other | Admitting: Dermatology

## 2023-08-10 ENCOUNTER — Telehealth: Payer: Self-pay

## 2023-08-10 ENCOUNTER — Encounter: Payer: Self-pay | Admitting: Dermatology

## 2023-08-10 DIAGNOSIS — C4492 Squamous cell carcinoma of skin, unspecified: Secondary | ICD-10-CM

## 2023-08-10 DIAGNOSIS — C44722 Squamous cell carcinoma of skin of right lower limb, including hip: Secondary | ICD-10-CM | POA: Diagnosis not present

## 2023-08-10 MED ORDER — MUPIROCIN 2 % EX OINT: 1.0000 | TOPICAL_OINTMENT | Freq: Every day | CUTANEOUS | 0 refills | Status: AC

## 2023-08-10 NOTE — Patient Instructions (Signed)

## 2023-08-10 NOTE — Telephone Encounter (Addendum)
 Spoke with patient's wife at 4 pm and asked how patient was doing. She said there has not been any bleeding and will leave pressure bandage on until tomorrow. Butch Penny., RMA

## 2023-08-10 NOTE — Progress Notes (Signed)
   Follow-Up Visit   Subjective  Bryan Avila is a 83 y.o. male who presents for the following: Excision of bx proven SCC at right popliteal fossa  The following portions of the chart were reviewed this encounter and updated as appropriate: medications, allergies, medical history  Review of Systems:  No other skin or systemic complaints except as noted in HPI or Assessment and Plan.  Objective  Well appearing patient in no apparent distress; mood and affect are within normal limits.  A focused examination was performed of the following areas: Right leg Relevant physical exam findings are noted in the Assessment and Plan.   Right Popliteal Fossa Healing bx site  Assessment & Plan   SQUAMOUS CELL CARCINOMA OF SKIN Right Popliteal Fossa Skin excision  Excision method:  elliptical Lesion length (cm):  0.5 Margin per side (cm):  0.4 Total excision diameter (cm):  1.3 Informed consent: discussed and consent obtained   Timeout: patient name, date of birth, surgical site, and procedure verified   Procedure prep:  Patient was prepped and draped in usual sterile fashion Prep type:  Chlorhexidine Anesthesia: the lesion was anesthetized in a standard fashion   Anesthetic:  1% lidocaine w/ epinephrine 1-100,000 buffered w/ 8.4% NaHCO3 (9 cc) Instrument used: #15 blade   Hemostasis achieved with: pressure   Outcome: patient tolerated procedure well with no complications   Additional details:  Tagged superior  Skin repair Complexity:  Intermediate Final length (cm):  4.1 Informed consent: discussed and consent obtained   Timeout: patient name, date of birth, surgical site, and procedure verified   Procedure prep:  Patient was prepped and draped in usual sterile fashion Prep type:  Chlorhexidine Anesthesia: the lesion was anesthetized in a standard fashion   Anesthetic:  1% lidocaine w/ epinephrine 1-100,000 buffered w/ 8.4% NaHCO3 Reason for type of repair: reduce tension to allow  closure, reduce the risk of dehiscence, infection, and necrosis, reduce subcutaneous dead space and avoid a hematoma, allow closure of the large defect and preserve normal anatomy   Undermining: edges undermined   Subcutaneous layers (deep stitches):  Suture size:  4-0 Suture type: Monocryl (poliglecaprone 25)   Stitches:  Buried vertical mattress Fine/surface layer approximation (top stitches):  Suture size:  5-0 Suture type: Prolene (polypropylene)   Stitches: simple running   Suture removal (days):  7 Hemostasis achieved with: suture, pressure and electrodesiccation Outcome: patient tolerated procedure well with no complications   Post-procedure details: sterile dressing applied and wound care instructions given   Dressing type: petrolatum, bandage and pressure dressing   Specimen 1 - Surgical pathology Differential Diagnosis: BX proven SCC  Check Margins: yes [id number]  Tagged superior   Return in about 1 week (around 08/17/2023) for Suture Removal.  Anise Salvo, RMA, am acting as scribe for Elie Goody, MD .   Documentation: I have reviewed the above documentation for accuracy and completeness, and I agree with the above.  Elie Goody, MD

## 2023-08-11 LAB — SURGICAL PATHOLOGY

## 2023-08-15 ENCOUNTER — Telehealth: Payer: Self-pay

## 2023-08-15 NOTE — Telephone Encounter (Signed)
 LVM advising patient that excision was clear of skin cancer, margins free.  Butch Penny., RMA

## 2023-08-15 NOTE — Telephone Encounter (Signed)
-----   Message from Waldo sent at 08/12/2023  8:00 AM EDT ----- Diagnosis right popliteal fossa :       NO RESIDUAL SQUAMOUS CELL CARCINOMA, MARGINS FREE   Please call to share that excision was clear of skin cancer and get update on surgical wound. Thank you.

## 2023-08-17 ENCOUNTER — Ambulatory Visit (INDEPENDENT_AMBULATORY_CARE_PROVIDER_SITE_OTHER)

## 2023-08-17 ENCOUNTER — Encounter: Payer: Self-pay | Admitting: Dermatology

## 2023-08-17 DIAGNOSIS — Z48817 Encounter for surgical aftercare following surgery on the skin and subcutaneous tissue: Secondary | ICD-10-CM

## 2023-08-17 DIAGNOSIS — Z85828 Personal history of other malignant neoplasm of skin: Secondary | ICD-10-CM

## 2023-08-17 LAB — BASIC METABOLIC PANEL WITH GFR
BUN/Creatinine Ratio: 19 (ref 10–24)
BUN: 19 mg/dL (ref 8–27)
CO2: 25 mmol/L (ref 20–29)
Calcium: 9.3 mg/dL (ref 8.6–10.2)
Chloride: 103 mmol/L (ref 96–106)
Creatinine, Ser: 1.02 mg/dL (ref 0.76–1.27)
Glucose: 86 mg/dL (ref 70–99)
Potassium: 4.3 mmol/L (ref 3.5–5.2)
Sodium: 141 mmol/L (ref 134–144)
eGFR: 73 mL/min/{1.73_m2} (ref >59)

## 2023-08-17 NOTE — Progress Notes (Signed)
   Suture Removal Visit   Subjective  Bryan Avila is a 83 y.o. 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  Objective  Well appearing patient in no apparent distress; mood and affect are within normal limits.  Areas Examined: The R lower leg  Relevant physical exam findings are noted in the Assessment and Plan.   Assessment & Plan    Encounter for Removal of Sutures - Incision site is clean, dry and intact. - Wound cleansed, sutures removed, wound cleansed and steri strips applied.  - Discussed pathology results showing a margins free squamous cell carcinoma - Patient advised to keep steri-strips dry until they fall off. - Scars remodel for a full year. - Once steri-strips fall off, patient can apply over-the-counter silicone scar cream once to twice a day to help with scar remodeling if desired. - Patient advised to call with any concerns or if they notice any new or changing lesions.  Some erythema at site. Dr. Roseanne Reno examined excision site and states that it does not appear to be infected at this time. Reviewed to watch for spreading redness, tenderness, and drainage. Should those occur, RTC.  Reviewed with patient and Bryan Avila to continue to be cautious over the next week with heavy lifting, squatting, pushing, pulling, etc.  Patient began feeling unwell after suture removal. I laid the patient back, get him a cup of water and sat with him and his Bryan Avila until he felt well again.   Return for appointment as scheduled.  Mickle Mallory CMA, AAMA

## 2023-08-17 NOTE — Patient Instructions (Signed)

## 2023-08-20 ENCOUNTER — Encounter

## 2023-08-20 DIAGNOSIS — G4733 Obstructive sleep apnea (adult) (pediatric): Secondary | ICD-10-CM

## 2023-08-29 DIAGNOSIS — G4733 Obstructive sleep apnea (adult) (pediatric): Secondary | ICD-10-CM

## 2023-08-29 DIAGNOSIS — R0683 Snoring: Secondary | ICD-10-CM | POA: Diagnosis not present

## 2023-09-14 ENCOUNTER — Encounter (INDEPENDENT_AMBULATORY_CARE_PROVIDER_SITE_OTHER): Payer: Self-pay

## 2023-09-14 ENCOUNTER — Telehealth (INDEPENDENT_AMBULATORY_CARE_PROVIDER_SITE_OTHER): Payer: Self-pay | Admitting: Nurse Practitioner

## 2023-09-14 NOTE — Telephone Encounter (Signed)
 Spoke with patient's wife and tried to schedule per note from Sharla Davis, NP. Patient's wife states that she is not ready to schedule patient right now. Will call us  back after "things settle down".

## 2023-09-19 ENCOUNTER — Ambulatory Visit (INDEPENDENT_AMBULATORY_CARE_PROVIDER_SITE_OTHER): Admitting: Dermatology

## 2023-09-19 ENCOUNTER — Encounter: Payer: Self-pay | Admitting: Dermatology

## 2023-09-19 DIAGNOSIS — T8130XA Disruption of wound, unspecified, initial encounter: Secondary | ICD-10-CM

## 2023-09-19 DIAGNOSIS — Z5189 Encounter for other specified aftercare: Secondary | ICD-10-CM

## 2023-09-19 NOTE — Patient Instructions (Addendum)
 Wash daily with soap and water. Apply Mupirocin  ointment, cover with bandage.     Due to recent changes in healthcare laws, you may see results of your pathology and/or laboratory studies on MyChart before the doctors have had a chance to review them. We understand that in some cases there may be results that are confusing or concerning to you. Please understand that not all results are received at the same time and often the doctors may need to interpret multiple results in order to provide you with the best plan of care or course of treatment. Therefore, we ask that you please give us  2 business days to thoroughly review all your results before contacting the office for clarification. Should we see a critical lab result, you will be contacted sooner.   If You Need Anything After Your Visit  If you have any questions or concerns for your doctor, please call our main line at [phone] and press option 4 to reach your doctor's medical assistant. If no one answers, please leave a voicemail as directed and we will return your call as soon as possible. Messages left after 4 pm will be answered the following business day.   You may also send us  a message via MyChart. We typically respond to MyChart messages within 1-2 business days.  For prescription refills, please ask your pharmacy to contact our office. Our fax number is [phone].  If you have an urgent issue when the clinic is closed that cannot wait until the next business day, you can [place] your doctor at the number below.    Please note that while we do our best to be available for urgent issues outside of office hours, we are not available 24/7.   If you have an urgent issue and are unable to reach us , you may choose to seek medical care at your doctor's office, retail clinic, urgent care center, or emergency room.  If you have a medical emergency, please immediately call 911 or go to the emergency department.  Pager Numbers  - Dr.  Bary Likes: [phone]  - Dr. Annette Barters: [phone]  - Dr. Felipe Horton: [phone]   In the event of inclement weather, please call our main line at [phone] for an update on the status of any delays or closures.  Dermatology Medication Tips: Please keep the boxes that topical medications come in in order to help keep track of the instructions about where and how to use these. Pharmacies typically print the medication instructions only on the boxes and not directly on the medication tubes.   If your medication is too expensive, please contact our office at [phone] option 4 or send us  a message through MyChart.   We are unable to tell what your co-pay for medications will be in advance as this is different depending on your insurance coverage. However, we may be able to find a substitute medication at lower cost or fill out paperwork to get insurance to cover a needed medication.   If a prior authorization is required to get your medication covered by your insurance company, please allow us  1-2 business days to complete this process.  Drug prices often vary depending on where the prescription is filled and some pharmacies may offer cheaper prices.  The website www.goodrx.com contains coupons for medications through different pharmacies. The prices here do not account for what the cost may be with help from insurance (it may be cheaper with your insurance), but the website can give you the price if you  did not use any insurance.  - You can print the associated coupon and take it with your prescription to the pharmacy.  - You may also stop by our office during regular business hours and pick up a GoodRx coupon card.  - If you need your prescription sent electronically to a different pharmacy, notify our office through [facility] or by phone at [phone] option 4.     Si Usted Necesita Algo Despus de Su Visita  Tambin puede enviarnos un mensaje a travs de Clinical cytogeneticist. Por lo  general respondemos a los mensajes de MyChart en el transcurso de 1 a 2 das hbiles.  Para renovar recetas, por favor pida a su farmacia que se ponga en contacto con nuestra oficina. Franz Jacks de fax es [place] [phone].  Si tiene un asunto urgente cuando la clnica est cerrada y que no puede esperar hasta el siguiente da hbil, puede llamar/localizar a su doctor(a) al nmero que aparece a continuacin.   Por favor, tenga en cuenta que aunque hacemos todo lo posible para estar disponibles para asuntos urgentes fuera del horario de [place], no estamos disponibles las 24 horas del da, los 7 [address]  [address] de la [place].   Si tiene un problema urgente y no puede comunicarse con nosotros, puede optar por buscar atencin mdica  en el consultorio de su doctor(a), en una clnica privada, en un centro de atencin urgente o en una sala de emergencias.  Si tiene Engineer, drilling, por favor llame inmediatamente al 911 o vaya a la sala de emergencias.  Nmeros de bper  - Dr. Bary Likes: [phone]  - Dra. Annette Barters: [phone]  - Dr. Felipe Horton: [phone]   En caso de inclemencias del tiempo, por favor llame a Lajuan Pila principal al [phone] para una actualizacin sobre el [place] de cualquier retraso o cierre.  Consejos para la medicacin en dermatologa: Por favor, guarde las cajas en las que vienen los medicamentos de uso tpico para ayudarle a seguir las instrucciones sobre dnde y cmo usarlos. Las farmacias generalmente imprimen las instrucciones del medicamento slo en las cajas y no directamente en los tubos del [place].   Si su medicamento es muy caro, por favor, pngase en contacto con Bettyjane Brunet llamando al [phone] y presione la opcin 4 o envenos un mensaje a travs de Clinical cytogeneticist.   No podemos decirle cul ser su copago por los medicamentos por adelantado ya que esto es diferente dependiendo de la cobertura de su seguro. Sin embargo, es posible que podamos encontrar  un medicamento sustituto a Audiological scientist un formulario para que el seguro cubra el medicamento que se considera necesario.   Si se requiere una autorizacin previa para que su compaa de seguros [place] su medicamento, por favor permtanos de 1 a 2 das hbiles para completar este proceso.  Los precios de los medicamentos varan con frecuencia dependiendo del Environmental consultant de dnde se surte la receta y alguna farmacias pueden ofrecer precios ms baratos.  El sitio web www.goodrx.com tiene cupones para medicamentos de Health and safety inspector. Los precios aqu no tienen en cuenta lo que podra costar con la ayuda del seguro (puede ser ms barato con su seguro), pero el sitio web puede darle el precio si no utiliz Tourist information centre manager.  - Puede imprimir el cupn correspondiente y llevarlo con su receta a la farmacia.  - Tambin puede pasar por nuestra oficina durante el horario de atencin regular y Education officer, museum una tarjeta de cupones de GoodRx.  - Si necesita que su receta  se enve electrnicamente a una farmacia diferente, informe a nuestra oficina a travs de MyChart de [place] o por telfono llamando al [phone] y presione la opcin 4.

## 2023-09-19 NOTE — Progress Notes (Signed)
   Follow-Up Visit   Subjective  Bryan Avila is a 83 y.o. male who presents for the following: concern with infection in right popliteal fossa. Hx of SCC excised 08/10/2023. Suture removal performed 08/17/2023. Non tender. No drainage.     The following portions of the chart were reviewed this encounter and updated as appropriate: medications, allergies, medical history  Review of Systems:  No other skin or systemic complaints except as noted in HPI or Assessment and Plan.  Objective  Well appearing patient in no apparent distress; mood and affect are within normal limits.   A focused examination was performed of the following areas: Right leg  Relevant exam findings are noted in the Assessment and Plan.  Right Popliteal Fossa Partial wound dehiscence at surgical site   Assessment & Plan     WOUND DEHISCENCE Right Popliteal Fossa Surgery was 08/10/23. If infected, unlikely to be from the surgery itself.  Aerobic swab Wash daily with soap and water. Apply Mupirocin  ointment, cover with bandage.   Aerobic culture - Right Popliteal Fossa VISIT FOR WOUND CHECK    Return in about 1 month (around 10/20/2023) for Wound Recheck.  I, Jill Parcell, CMA, am acting as scribe for Harris Liming, MD.   Documentation: I have reviewed the above documentation for accuracy and completeness, and I agree with the above.  Harris Liming, MD

## 2023-09-20 ENCOUNTER — Encounter: Payer: Self-pay | Admitting: Primary Care

## 2023-09-20 ENCOUNTER — Ambulatory Visit (INDEPENDENT_AMBULATORY_CARE_PROVIDER_SITE_OTHER): Admitting: Primary Care

## 2023-09-20 VITALS — BP 122/64 | HR 70 | Temp 97.7°F | Ht 66.5 in | Wt 152.0 lb

## 2023-09-20 DIAGNOSIS — Q798 Other congenital malformations of musculoskeletal system: Secondary | ICD-10-CM

## 2023-09-20 NOTE — Assessment & Plan Note (Signed)
 There is obvious abnormality under the tongue overlapping the hyoid bone.  Unclear if this is his normal anatomy or something new.  My suspicion is that this is his normal anatomy as he follows with a dentist who has never mentioned this to him.  There are no oral lesions or anything to suspect a cancerous growth.  He will follow-up with his dentist tomorrow as scheduled.

## 2023-09-20 NOTE — Progress Notes (Signed)
 Subjective:    Patient ID: Bryan Avila, male    DOB: 02/22/1941, 83 y.o.   MRN: [id number]  HPI  Bryan Avila is a very pleasant 83 y.o. male with a history of right bundle branch block, pulmonary hypertension, atrial fibrillation, hyperlipidemia who presents today to discuss oral cavity abnormality.  He visited his pulmonologist on 08/08/23 for sleep apnea evaluation. He was told by Dr. Kieran Pellet that he has a bone underneath his tongue so he was advised to follow up with PCP.   He denies feeling a bone underneath his tongue, oral cavity pain, difficulty swallowing, oral swelling. He has followed with a dentist most of his life. He's been seeing his most recent dentist for the last 15 years and she's never mentioned anything about abnormality in his oral cavity. He has an appointment scheduled tomorrow with his dentist.   Review of Systems  HENT:  Negative for dental problem, facial swelling and trouble swallowing.          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   right popliteal fossa, excised 08/10/23    Social History   Socioeconomic History   Marital status: Married    Spouse name: Not on file   Number of children: 1   Years of education: Not on file   Highest education level: Not on file  Occupational History   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   Would not want Tube Feeds.   Does not have a living will or HPOA.   Social Drivers of Corporate investment banker Strain: Low Risk  (01/01/2023)   Overall Financial Resource Strain (CARDIA)    Difficulty of Paying Living Expenses: Not hard at all  Food Insecurity: No Food Insecurity (01/01/2023)   Hunger Vital Sign    Worried About Running Out of Food in the Last Year: Never true    Ran Out of Food in the Last Year: Never true  Transportation Needs: No Transportation Needs (01/01/2023)   PRAPARE - Administrator, Civil Service (Medical): No    Lack of Transportation (Non-Medical): No  Physical Activity: Sufficiently Active (01/01/2023)   Exercise Vital Sign    Days of Exercise per Week: 6 days    Minutes of Exercise per Session: 30 min  Stress: No Stress Concern Present (01/01/2023)   [name] of Occupational Health - Occupational Stress Questionnaire    Feeling of Stress : Not at all  Social Connections: Socially Integrated (01/01/2023)   Social Connection and Isolation Panel [NHANES]    Frequency of Communication with Friends and Family: Twice a week  Frequency of Social Gatherings with Friends and Family: Twice a week    Attends Religious Services: More than 4 times per year    Active Member of [employer] or Organizations: Yes    Attends Banker Meetings: Never    Marital Status: Married  Catering manager Violence: Not At Risk (01/04/2022)   Humiliation, Afraid, Rape, and Kick questionnaire    Fear of Current or Ex-Partner: No    Emotionally Abused: No    Physically Abused: No    Sexually Abused: No    Past Surgical History:  Procedure Laterality Date   EYE SURGERY     thumb surgery  1999   right    Family History  Problem Relation Age of Onset   Arthritis Father    Sleep apnea Father    Alcohol  abuse Brother    Prostate cancer Brother    Cancer Paternal Grandfather        prostate    Allergies  Allergen Reactions   Meperidine Other (See Comments) and Nausea Only    "I almost died from that."-Severe bradycardia Other reaction(s): Unknown DECREASED BLOOD PRESSURE "I almost died from that."-Severe bradycardia     Current Outpatient Medications on File Prior to Visit  Medication Sig Dispense Refill   acyclovir (ZOVIRAX) 400 MG tablet Take 400 mg by mouth 2 (two) times daily.  99   apixaban  (ELIQUIS ) 5 MG TABS tablet Take 1 tablet (5 mg total) by mouth 2 (two) times daily. 60 tablet 3   furosemide  (LASIX ) 20 MG tablet Take 1 tablet (20 mg total) by mouth every other day. 45 tablet 3   losartan  (COZAAR ) 25 MG tablet Take 0.5 tablets (12.5 mg total) by mouth daily. 90 tablet 3   metoprolol  succinate (TOPROL -XL) 25 MG 24 hr tablet Take 1 tablet (25 mg total) by mouth daily. Take with or immediately following a meal. 90 tablet 3   Multiple Vitamins-Minerals (PRESERVISION AREDS PO) Take by mouth 2 (two) times daily.     mupirocin  ointment (BACTROBAN ) 2 % Apply 1 Application topically daily. 22 g 0   potassium chloride  SA (KLOR-CON  M20) 20 MEQ tablet Take 1 tablet (20 mEq total) by mouth every other day. 45 tablet 3   timolol (BETIMOL) 0.5 % ophthalmic solution Place 1 drop into the right eye daily.     No current facility-administered medications on file prior to visit.    BP 122/64   Pulse 70   Temp 97.7 F (36.5 C) (Temporal)   Ht 5' 6.5" (1.689 m)   Wt 152 lb (68.9 kg)   SpO2 99%   BMI 24.17 kg/m  Objective:   Physical Exam Constitutional:      General: He is not in acute distress. HENT:     Mouth/Throat:     Dentition: No dental abscesses.     Tongue: No lesions.     Palate: No mass and lesions.     Pharynx: No pharyngeal swelling or posterior oropharyngeal erythema.     Comments: Hyoid bone appears elevated, left side greater than right. Soft pink tissue  covering hyoid bone without lesions, skin breakdown, erythema. Several molar teeth missing.           Assessment & Plan:  Congenital anomaly of hyoid bone Assessment & Plan: There is obvious abnormality under the tongue overlapping the hyoid bone.  Unclear if this is his normal anatomy or something new.  My suspicion is that this is his normal anatomy as he follows  with a dentist who has never mentioned this to him.  There are no oral lesions or anything to suspect a cancerous growth.  He will follow-up with his dentist tomorrow as scheduled.         Nyela Cortinas K Rodricus Candelaria, NP

## 2023-09-20 NOTE — Patient Instructions (Signed)
 Follow up with your dentist as scheduled

## 2023-09-22 ENCOUNTER — Ambulatory Visit: Payer: Self-pay | Admitting: Dermatology

## 2023-09-22 LAB — AEROBIC CULTURE

## 2023-09-22 MED ORDER — CEPHALEXIN 250 MG PO CAPS: 250.0000 mg | ORAL_CAPSULE | Freq: Four times a day (QID) | ORAL | 0 refills | Status: AC

## 2023-09-22 NOTE — Telephone Encounter (Signed)
-----   Message from Ramer sent at 09/22/2023 12:30 PM EDT ----- Patient will not get notified of MyChart message. Please call to share that culture from wound grew Staphylococcus aureus bacteria (NOT MRSA). Please prescribe cephalexin  250 mg four times per day x 7 days. Thank you

## 2023-09-22 NOTE — Telephone Encounter (Signed)
 Discussed C&S results with patient's wife. Cephalexin  250 mg 1 po qid sent to [facility].

## 2023-09-27 ENCOUNTER — Encounter (INDEPENDENT_AMBULATORY_CARE_PROVIDER_SITE_OTHER): Payer: Self-pay

## 2023-09-27 ENCOUNTER — Telehealth: Payer: Self-pay

## 2023-09-27 NOTE — Telephone Encounter (Signed)
 Patient wife called to have amanda send -patients pathology results from 2020 for her insurance  [employer] [place] fax # [phone] claim #[id number] or email- [email or web address].   Mylinda Asa will fax this report

## 2023-09-30 NOTE — Progress Notes (Unsigned)
 Cardiology Office Note    Date:  10/04/2023   ID:  Bryan Avila, DOB August 18, 1940, MRN [id number]  PCP:  Gabriel John, NP  Cardiologist:  Belva Boyden, MD  Electrophysiologist:  None   Chief Complaint: Follow up  History of Present Illness:   Bryan Avila is a 83 y.o. male with history of dilated cardiomyopathy with HFmrEF, pulmonary hypertension, persistent A-fib, moderate to severe tricuspid regurgitation, RBBB, recently diagnosed OSA now on CPAP, and venous insufficiency who presents for follow-up of cardiomyopathy and Afib.   He was previously evaluated in 2012 for dizziness and RBBB with further testing deferred at that time.  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  More recently, he was evaluated at his PCP's office in 05/2023 for lower extremity swelling felt to be related to chronic venous insufficiency noted improvement in compression stockings and leg elevation.  BNP was obtained and found to be mildly elevated at 231.  Given this, he underwent echo in 06/2023 showed an EF of 45 to 50%, global hypokinesis, low normal RV systolic function with severely enlarged RV cavity size, moderately elevated RVSP estimated at 45.5 mmHg, severe biatrial enlargement, mild mitral regurgitation, moderate to severe tricuspid regurgitation, mild aortic insufficiency, and an estimated right atrial pressure of 15 mmHg.  During the study the patient was noted to be in A-fib.  He followed up with Dr. Gollan on 07/04/2023 for persistent A-fib with unclear timing of onset, dilated cardiomyopathy, and valvular heart disease.  It was recommended he start apixaban  along with metoprolol  25 mg daily and furosemide  20 mg every other day.  Lower extremity venous ultrasound in 07/2022 showed no evidence of DVT bilaterally with venous reflux noted bilaterally.  He was last seen in the office on 08/04/2023 noting improvement  in lower extremity swelling following leg elevation, compression socks, and addition of furosemide .  He preferred to pursue rate control strategy over rhythm control, and GDMT was escalated with the addition of losartan .  He comes in accompanied by his wife today and remains without symptoms of angina or cardiac decompensation.  No dyspnea, palpitations, dizziness, presyncope, or syncope.  Lower extremity swelling remains much improved following leg elevation, compression socks, and light every other day.  No falls or symptoms concerning for bleeding.  He continues to decline a rhythm control strategy, wanting to pursue rate control.   Labs independently reviewed: 08/2023 - BUN 19, serum creatinine 1.02, potassium 4.3 07/2023 - TSH normal, magnesium 2.3, Hgb 13.8, PLT 259 01/2023 - albumin 3.7, AST/ALT normal, TC 181, TG 79, HDL 70, LDL 96 10/2017 - A1c 5.9   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   apixaban  (ELIQUIS ) 5 MG TABS tablet Take 1 tablet (5 mg total) by mouth 2 (two) times daily.   furosemide  (LASIX ) 20 MG tablet Take 1 tablet (20 mg total) by mouth every other day.   losartan  (COZAAR ) 25 MG tablet Take 0.5 tablets (12.5 mg total) by mouth daily.   metoprolol  succinate (TOPROL -XL) 25 MG 24 hr tablet Take 1 tablet (25 mg total) by  mouth daily. Take with or immediately following a meal.   Multiple Vitamins-Minerals (PRESERVISION AREDS PO) Take by mouth 2 (two) times daily.   mupirocin  ointment (BACTROBAN ) 2 % Apply 1 Application topically daily.   potassium chloride  SA (KLOR-CON  M20) 20 MEQ tablet Take 1 tablet (20 mEq total) by mouth every other day.   timolol (BETIMOL) 0.5 % ophthalmic solution Place 1 drop into the right eye daily.    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   Would not want Tube Feeds.   Does not have a living will or HPOA.   Social Drivers of Corporate investment banker Strain: Low Risk  (01/01/2023)   Overall Financial Resource Strain (CARDIA)    Difficulty of Paying Living Expenses: Not hard at all  Food Insecurity: No Food Insecurity (01/01/2023)   Hunger Vital Sign    Worried About Running Out of Food in the Last Year: Never true    Ran Out of Food in the Last Year: Never true  Transportation Needs: No Transportation Needs (01/01/2023)   PRAPARE - Administrator, Civil Service (Medical): No    Lack of Transportation (Non-Medical): No  Physical Activity: Sufficiently Active (01/01/2023)   Exercise Vital Sign    Days of Exercise per Week: 6 days    Minutes of Exercise per Session: 30 min  Stress: No Stress Concern Present (01/01/2023)   [name] of  Occupational Health - Occupational Stress Questionnaire    Feeling of Stress : Not at all  Social Connections: Socially Integrated (01/01/2023)   Social Connection and Isolation Panel [NHANES]    Frequency of Communication with Friends and Family: Twice a week    Frequency of Social Gatherings with Friends and Family: Twice a week    Attends Religious Services: More than 4 times per year    Active Member of [employer] or Organizations: Yes    Attends Banker Meetings: Never    Marital Status: Married     Family History:  The patient's family history includes Alcohol abuse in his brother; Arthritis in his father; Cancer in his paternal grandfather; Prostate cancer in his brother; Sleep apnea in his father.  ROS:   12-point review of systems is negative unless otherwise noted in the HPI.   EKGs/Labs/Other  Studies Reviewed:    Studies reviewed were summarized above. The additional studies were reviewed today:  2D echo 06/27/2023: 1. Left ventricular ejection fraction, by estimation, is 45 to 50%. The  left ventricle has mildly decreased function. The left ventricle  demonstrates global hypokinesis. Left ventricular diastolic parameters are  indeterminate. The global longitudinal  strain is indeterminate.   2. Right ventricular systolic function is low normal. The right  ventricular size is severely enlarged. There is moderately elevated  pulmonary artery systolic pressure. The estimated right ventricular  systolic pressure is 45.5 mmHg.   3. Left atrial size was severely dilated.   4. Right atrial size was severely dilated.   5. The mitral valve is normal in structure. Mild mitral valve  regurgitation.   6. Tricuspid valve regurgitation is moderate to severe.   7. The aortic valve is tricuspid. Aortic valve regurgitation is mild.   8. The inferior vena cava is dilated in size with <50% respiratory  variability, suggesting right atrial pressure of 15 mmHg.  _________    Lower extremity venous reflux ultrasound 07/14/2023: Summary:  Right:  - No evidence of deep vein thrombosis seen in the right lower extremity,  from the common femoral through the popliteal veins.  - No evidence of superficial venous thrombosis in the right lower  extremity.  - Venous reflux is noted in the right greater saphenous vein in the thigh.  - Venous reflux is noted in the right femoral vein.  - Venous reflux is noted in the right popliteal vein.  - Venous reflux is noted in the right short saphenous vein.    Left:  - No evidence of deep vein thrombosis seen in the left lower extremity,  from the common femoral through the popliteal veins.  - No evidence of superficial venous thrombosis in the left lower  extremity.  - No evidence of superficial venous reflux seen in the left short  saphenous vein.  - Venous reflux is noted in the left sapheno-femoral junction.  - Venous reflux is noted in the left greater saphenous vein in the thigh.  - Venous reflux is noted in the left greater saphenous vein in the calf.  - Venous reflux is noted in the left femoral vein.    EKG:  EKG is ordered today.  The EKG ordered today demonstrates a-fib, 71 bpm, RBBB, baseline wandering, consistent with prior tracing  Recent Labs: 01/13/2023: ALT 11 05/31/2023: Pro B Natriuretic peptide (BNP) 231.0 08/04/2023: Hemoglobin 13.8; Magnesium 2.3; Platelets 259; TSH 2.430 08/16/2023: BUN 19; Creatinine, Ser 1.02; Potassium 4.3; Sodium 141  Recent Lipid Panel    Component Value Date/Time   CHOL 181 01/13/2023 1034   TRIG 79.0 01/13/2023 1034   HDL 70.00 01/13/2023 1034   CHOLHDL 3 01/13/2023 1034   VLDL 15.8 01/13/2023 1034   LDLCALC 96 01/13/2023 1034    PHYSICAL EXAM:    VS:  BP 112/70   Pulse 71   Ht 5' 6.5" (1.689 m)   Wt 151 lb 12.8 oz (68.9 kg)   SpO2 95%   BMI 24.13 kg/m   BMI: Body mass index is 24.13 kg/m.  Physical Exam Vitals reviewed.  Constitutional:      Appearance: He is  well-developed.  HENT:     Head: Normocephalic and atraumatic.  Eyes:     General:        Right eye: No discharge.        Left eye: No discharge.  Cardiovascular:  Rate and Rhythm: Normal rate and regular rhythm.     Heart sounds: S1 normal and S2 normal. Heart sounds not distant. No midsystolic click and no opening snap. Murmur heard.     Systolic murmur is present with a grade of 2/6 at the upper left sternal border.     No friction rub.  Pulmonary:     Effort: Pulmonary effort is normal. No respiratory distress.     Breath sounds: Normal breath sounds. No decreased breath sounds, wheezing, rhonchi or rales.  Chest:     Chest wall: No tenderness.  Musculoskeletal:     Cervical back: Normal range of motion.     Right lower leg: No edema.     Left lower leg: No edema.     Comments: Compression socks noted bilaterally.  Skin:    General: Skin is warm and dry.     Nails: There is no clubbing.  Neurological:     Mental Status: He is alert and oriented to person, place, and time.  Psychiatric:        Speech: Speech normal.        Behavior: Behavior normal.        Thought Content: Thought content normal.        Judgment: Judgment normal.     Wt Readings from Last 3 Encounters:  10/04/23 151 lb 12.8 oz (68.9 kg)  09/20/23 152 lb (68.9 kg)  08/08/23 152 lb (68.9 kg)     ASSESSMENT & PLAN:   Persistent A-fib: He remains in A-fib of uncertain chronicity with controlled ventricular response.  He is asymptomatic.  He continues to request we pursue rate control strategy.  With dilated left atrium measuring 52 mm, maintaining sinus rhythm may be challenging, particularly without antiarrhythmic therapy.  He remains on Toprol -XL 25 mg.  CHA2DS2-VASc at least 3 (CHF, age x 2).  Continue apixaban  5 mg twice daily, does not currently meet reduced dosing criteria.  No falls or symptoms concerning for bleeding.  Recent labs stable.  Dilated HFmrEF and pulmonary hypertension: Unclear  etiology at this time.  He appears euvolemic and well compensated with NYHA class II symptoms.  Continue current GDMT including losartan  12.5 mg and Toprol -XL 25 mg daily along with furosemide  20 mg every other day.  Update echo in 2 months with recommendation to escalate GDMT and pursue ischemic testing as indicated based on findings.    Moderate to severe tricuspid regurgitation: Obtain echo in 2 months with continuation of pharmacotherapy as outlined above.  Chronic venous insufficiency: Much improved with leg elevation and compression socks.  Followed by vascular surgery.  OSA: Now on CPAP.  Followed by pulmonology.    Disposition: F/u with Dr. Gollan or an APP in 3 months.   Medication Adjustments/Labs and Tests Ordered: Current medicines are reviewed at length with the patient today.  Concerns regarding medicines are outlined above. Medication changes, Labs and Tests ordered today are summarized above and listed in the Patient Instructions accessible in Encounters.   SignedVarney Gentleman, PA-C 10/04/2023 11:48 AM     [facility] [place] [address] [place], [place] [address] [phone]

## 2023-10-04 ENCOUNTER — Ambulatory Visit: Attending: Physician Assistant | Admitting: Physician Assistant

## 2023-10-04 ENCOUNTER — Encounter: Payer: Self-pay | Admitting: Physician Assistant

## 2023-10-04 VITALS — BP 112/70 | HR 71 | Ht 66.5 in | Wt 151.8 lb

## 2023-10-04 DIAGNOSIS — I071 Rheumatic tricuspid insufficiency: Secondary | ICD-10-CM | POA: Insufficient documentation

## 2023-10-04 DIAGNOSIS — Z79899 Other long term (current) drug therapy: Secondary | ICD-10-CM | POA: Diagnosis present

## 2023-10-04 DIAGNOSIS — I428 Other cardiomyopathies: Secondary | ICD-10-CM | POA: Diagnosis not present

## 2023-10-04 DIAGNOSIS — I272 Pulmonary hypertension, unspecified: Secondary | ICD-10-CM | POA: Insufficient documentation

## 2023-10-04 DIAGNOSIS — G4733 Obstructive sleep apnea (adult) (pediatric): Secondary | ICD-10-CM | POA: Diagnosis present

## 2023-10-04 DIAGNOSIS — I5022 Chronic systolic (congestive) heart failure: Secondary | ICD-10-CM | POA: Insufficient documentation

## 2023-10-04 DIAGNOSIS — I4819 Other persistent atrial fibrillation: Secondary | ICD-10-CM | POA: Diagnosis not present

## 2023-10-04 DIAGNOSIS — I872 Venous insufficiency (chronic) (peripheral): Secondary | ICD-10-CM | POA: Diagnosis present

## 2023-10-04 NOTE — Patient Instructions (Signed)
 Medication Instructions:  Your physician recommends that you continue on your current medications as directed. Please refer to the Current Medication list given to you today.   *If you need a refill on your cardiac medications before your next appointment, please call your pharmacy*  Lab Work: Your provider would like for you to have following labs drawn today (BMP).    Testing/Procedures: Your physician has requested that you have an echocardiogram in 2 months. Echocardiography is a painless test that uses sound waves to create images of your heart. It provides your doctor with information about the size and shape of your heart and how well your heart's chambers and valves are working.   You may receive an ultrasound enhancing agent through an IV if needed to better visualize your heart during the echo. This procedure takes approximately one hour.  There are no restrictions for this procedure.  This will take place at [address] [facility]) [address], [place] [address]  Please note: We ask at that you not bring children with you during ultrasound (echo/ vascular) testing. Due to room size and safety concerns, children are not allowed in the ultrasound rooms during exams. Our front office staff cannot provide observation of children in our lobby area while testing is being conducted. An adult accompanying a patient to their appointment will only be allowed in the ultrasound room at the discretion of the ultrasound technician under special circumstances. We apologize for any inconvenience.   Follow-Up: At [facility], you and your health needs are our priority.  As part of our continuing mission to provide you with exceptional heart care, our providers are all part of one team.  This team includes your primary Cardiologist (physician) and Advanced Practice Providers or APPs (Physician Assistants and Nurse Practitioners) who all work together to provide you with the care  you need, when you need it.  Your next appointment:   3 month(s)  Provider:   Varney Gentleman, PA-C

## 2023-10-05 ENCOUNTER — Encounter: Payer: Self-pay | Admitting: Podiatry

## 2023-10-05 ENCOUNTER — Ambulatory Visit: Payer: Self-pay | Admitting: Physician Assistant

## 2023-10-05 LAB — BASIC METABOLIC PANEL WITH GFR
BUN/Creatinine Ratio: 21 (ref 10–24)
BUN: 20 mg/dL (ref 8–27)
CO2: 20 mmol/L (ref 20–29)
Calcium: 9.5 mg/dL (ref 8.6–10.2)
Chloride: 103 mmol/L (ref 96–106)
Creatinine, Ser: 0.96 mg/dL (ref 0.76–1.27)
Glucose: 86 mg/dL (ref 70–99)
Potassium: 4.7 mmol/L (ref 3.5–5.2)
Sodium: 142 mmol/L (ref 134–144)
eGFR: 79 mL/min/{1.73_m2} (ref >59)

## 2023-10-12 ENCOUNTER — Ambulatory Visit (INDEPENDENT_AMBULATORY_CARE_PROVIDER_SITE_OTHER): Admitting: Podiatry

## 2023-10-12 ENCOUNTER — Encounter: Payer: Self-pay | Admitting: Podiatry

## 2023-10-12 DIAGNOSIS — L600 Ingrowing nail: Secondary | ICD-10-CM

## 2023-10-12 MED ORDER — NEOMYCIN-POLYMYXIN-HC 1 % OT SOLN: OTIC | 1 refills | Status: AC

## 2023-10-12 NOTE — Progress Notes (Signed)
 Subjective:  Patient ID: Bryan Avila, male    DOB: 10-21-1940,  MRN: [id number] HPI Chief Complaint  Patient presents with   Toe Pain    Hallux bilateral - lateral borders (L>R), tender x few months, left starting to get red, tried to trim, patient on Eliquis    New Patient (Initial Visit)    83 y.o. male presents with the above complaint.   ROS: Denies fever chills nausea vomiting muscle aches pains calf pain back pain chest pain shortness of breath.  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   right popliteal fossa, excised 08/10/23   Past Surgical History:  Procedure Laterality Date   EYE SURGERY     thumb surgery  1999   right    Current Outpatient Medications:    NEOMYCIN-POLYMYXIN-HYDROCORTISONE (CORTISPORIN) 1 % SOLN OTIC solution, Apply 1-2 drops to toe BID after soaking, Disp: 10 mL, Rfl: 1   acyclovir (ZOVIRAX) 400 MG tablet, Take 400 mg by mouth 2 (two) times daily., Disp: , Rfl: 99   apixaban  (ELIQUIS ) 5 MG TABS tablet, Take 1 tablet (5 mg total) by mouth 2 (two) times daily., Disp: 60 tablet, Rfl: 3   furosemide  (LASIX ) 20 MG tablet, Take 1 tablet (20 mg total) by mouth every other day., Disp: 45 tablet, Rfl: 3   losartan  (COZAAR ) 25 MG tablet, Take 0.5 tablets (12.5 mg total) by mouth daily., Disp: 90 tablet, Rfl: 3   metoprolol  succinate (TOPROL -XL) 25 MG 24 hr tablet, Take 1 tablet (25 mg total) by mouth daily. Take with or immediately following a meal., Disp: 90 tablet, Rfl: 3   Multiple Vitamins-Minerals (PRESERVISION AREDS PO), Take by mouth 2 (two) times daily., Disp: , Rfl:     mupirocin  ointment (BACTROBAN ) 2 %, Apply 1 Application topically daily., Disp: 22 g, Rfl: 0   potassium chloride  SA (KLOR-CON  M20) 20 MEQ tablet, Take 1 tablet (20 mEq total) by mouth every other day., Disp: 45 tablet, Rfl: 3   timolol (BETIMOL) 0.5 % ophthalmic solution, Place 1 drop into the right eye daily., Disp: , Rfl:   Allergies  Allergen Reactions   Meperidine Other (See Comments) and Nausea Only    "I almost died from that."-Severe bradycardia Other reaction(s): Unknown DECREASED BLOOD PRESSURE "I almost died from that."-Severe bradycardia    Review of Systems Objective:  There were no vitals filed for this visit.  General: Well developed, nourished, in no acute distress, alert and oriented x3   Dermatological: Skin is warm, dry and supple bilateral. Nails x 10 are well maintained; remaining integument appears unremarkable at this time. There are no open sores, no preulcerative lesions, no rash or signs of infection present.  Sharp incurvated nail margin fibular border hallux left with nail dystrophy hallux left.  Mild erythema no cellulitis drainage or odor tender on palpation.  Vascular: Dorsalis Pedis artery and Posterior Tibial artery pedal pulses are 2/4 bilateral with immedate capillary fill time. Pedal hair growth present. No varicosities and no lower extremity edema present bilateral.   Neruologic: Grossly intact via light touch bilateral. Vibratory intact  via tuning fork bilateral. Protective threshold with Semmes Wienstein monofilament intact to all pedal sites bilateral. Patellar and Achilles deep tendon reflexes 2+ bilateral. No Babinski or clonus noted bilateral.   Musculoskeletal: No gross boney pedal deformities bilateral. No pain, crepitus, or limitation noted with foot and ankle range of motion bilateral. Muscular strength 5/5 in all groups tested bilateral.  Gait: Unassisted, Nonantalgic.    Radiographs:  None taken  Assessment & Plan:   Assessment:  Ingrown toenail fibular border hallux left.  Plan: Chemical matricectomy was performed today to the left fibular border hallux.  This was performed after local anesthetic was administered.  He tolerated this procedure well.  The nail split from distal proximal avulsion a total which then exposed the matrix and the nailbed so that we could apply phenol 3 applications 30 seconds each.  He was given both all the written home-going instruction of the care and soaking the toe as well as prescription Cortisporin Otic be applied twice daily follow-up with him in 2 weeks may consider doing the contralateral great toe at that time.     Asiel Chrostowski T. [place], [place]

## 2023-10-12 NOTE — Patient Instructions (Signed)

## 2023-10-17 ENCOUNTER — Encounter: Payer: Self-pay | Admitting: Dermatology

## 2023-10-17 ENCOUNTER — Ambulatory Visit (INDEPENDENT_AMBULATORY_CARE_PROVIDER_SITE_OTHER): Admitting: Dermatology

## 2023-10-17 ENCOUNTER — Other Ambulatory Visit

## 2023-10-17 DIAGNOSIS — T8131XD Disruption of external operation (surgical) wound, not elsewhere classified, subsequent encounter: Secondary | ICD-10-CM | POA: Diagnosis not present

## 2023-10-17 DIAGNOSIS — L859 Epidermal thickening, unspecified: Secondary | ICD-10-CM | POA: Diagnosis not present

## 2023-10-17 DIAGNOSIS — T8130XA Disruption of wound, unspecified, initial encounter: Secondary | ICD-10-CM

## 2023-10-17 DIAGNOSIS — B958 Unspecified staphylococcus as the cause of diseases classified elsewhere: Secondary | ICD-10-CM

## 2023-10-17 NOTE — Patient Instructions (Signed)

## 2023-10-17 NOTE — Progress Notes (Signed)
   Follow-Up Visit   Subjective  Bryan Avila is a 83 y.o. male who presents for the following: Recheck wound, currently using Mupirocin  2% ointment most days, and covering with bandage. Pt c/o occasional itch, but otherwise not bothersome.   The following portions of the chart were reviewed this encounter and updated as appropriate: medications, allergies, medical history  Review of Systems:  No other skin or systemic complaints except as noted in HPI or Assessment and Plan.  Objective  Well appearing patient in no apparent distress; mood and affect are within normal limits.   A focused examination was performed of the following areas: the face and legs   Relevant exam findings are noted in the Assessment and Plan.    Assessment & Plan   WOUND DEHISCENCE - bx proven SCC, S/P excision on 08/10/23 (margins free)    Right Popliteal Fossa Exam: healing surgical incision with superior aspect that was dehisced now healed, central area that did not dehisce continues to do well, inferior aspect that dehisced is healing with desquamative hyperkeratosis  Surgery was 08/10/23. If infected, unlikely to be from the surgery itself. Culture positive for Staphylococcus aureus bacteria (09/19/23), not MRSA, S/P Cephalexin  250 mg QID x 7 days. Currently using Mupirocin  2% ointment and covering with bandage on most days.  Plan: Continue Mupirocin  2% ointment to aa QD and cover with bandage.  Expect hyperkeratosis to self resolve WOUND DEHISCENCE   STAPHYLOCOCCAL INFECTION    Return for appointment as scheduled for [facility].  Arlinda Lais, CMA, am acting as scribe for Harris Liming, MD .   Documentation: I have reviewed the above documentation for accuracy and completeness, and I agree with the above.  Harris Liming, MD

## 2023-11-02 ENCOUNTER — Ambulatory Visit (INDEPENDENT_AMBULATORY_CARE_PROVIDER_SITE_OTHER): Admitting: Podiatry

## 2023-11-02 ENCOUNTER — Encounter: Payer: Self-pay | Admitting: Podiatry

## 2023-11-02 ENCOUNTER — Telehealth: Payer: Self-pay | Admitting: Cardiovascular Disease

## 2023-11-02 DIAGNOSIS — L03032 Cellulitis of left toe: Secondary | ICD-10-CM | POA: Diagnosis not present

## 2023-11-02 DIAGNOSIS — I4819 Other persistent atrial fibrillation: Secondary | ICD-10-CM

## 2023-11-02 MED ORDER — CLINDAMYCIN HCL 150 MG PO CAPS: 150.0000 mg | ORAL_CAPSULE | Freq: Three times a day (TID) | ORAL | 1 refills | Status: AC

## 2023-11-02 MED ORDER — APIXABAN 5 MG PO TABS: 5.0000 mg | ORAL_TABLET | Freq: Two times a day (BID) | ORAL | 1 refills | Status: DC

## 2023-11-02 NOTE — Telephone Encounter (Signed)
*  STAT* If patient is at the pharmacy, call can be transferred to refill team.   1. Which medications need to be refilled? (please list name of each medication and dose if known) apixaban  (ELIQUIS ) 5 MG TABS tablet    2. Would you like to learn more about the convenience, safety, & potential cost savings by using the [facility]?     3. Are you open to using the [facility] (Type [facility]. ).   4. Which pharmacy/location (including street and city if local pharmacy) is medication to be sent to? [id number] - [place], [place] - [address]    5. Do they need a 30 day or 90 day supply? 90 day

## 2023-11-02 NOTE — Telephone Encounter (Signed)
 Prescription refill request for Eliquis  received. Indication: Afib  Last office visit: 10/04/23 ([place])  Scr: 0.96 (10/04/23)  Age: 83 Weight: 68.9kg  Appropriate dose. Refill sent.

## 2023-11-02 NOTE — Progress Notes (Signed)
 He presents today for nail check fibular border hallux left.  States that is doing fine is just a little red.  Just a little tender.  Objective: Vital signs are stable he is alert oriented x 3.  There drainage or odor.  There is moderate edema and moderate erythema along the fibular border.  Assessment paronychia status post matrixectomy.  Plan: Encouraged him to continue soaking Epsom salts warm water soaks cover during the day leave open at bedtime continue use of Cortisporin otic.  And we will start him on clindamycin twice daily.  Follow-up with him in 3 weeks

## 2023-11-08 ENCOUNTER — Ambulatory Visit (INDEPENDENT_AMBULATORY_CARE_PROVIDER_SITE_OTHER): Admitting: Primary Care

## 2023-11-08 ENCOUNTER — Encounter: Payer: Self-pay | Admitting: Primary Care

## 2023-11-08 VITALS — BP 102/58 | HR 80 | Temp 97.8°F | Ht 66.5 in | Wt 152.0 lb

## 2023-11-08 DIAGNOSIS — R3129 Other microscopic hematuria: Secondary | ICD-10-CM | POA: Insufficient documentation

## 2023-11-08 DIAGNOSIS — R82998 Other abnormal findings in urine: Secondary | ICD-10-CM | POA: Diagnosis not present

## 2023-11-08 DIAGNOSIS — K625 Hemorrhage of anus and rectum: Secondary | ICD-10-CM | POA: Insufficient documentation

## 2023-11-08 HISTORY — DX: Hemorrhage of anus and rectum: K62.5

## 2023-11-08 LAB — POC URINALSYSI DIPSTICK (AUTOMATED)
Bilirubin, UA: NEGATIVE
Blood, UA: 1
Glucose, UA: NEGATIVE
Ketones, UA: NEGATIVE
Leukocytes, UA: NEGATIVE
Nitrite, UA: NEGATIVE
Protein, UA: NEGATIVE
Spec Grav, UA: <=1.005 — AB (ref 1.010–1.025)
Urobilinogen, UA: 0.2 U/dL
pH, UA: 6 (ref 5.0–8.0)

## 2023-11-08 LAB — HEMOCCULT GUIAC POC 1CARD (OFFICE): Fecal Occult Blood, POC: POSITIVE — AB

## 2023-11-08 NOTE — Progress Notes (Signed)
 Subjective:    Patient ID: Bryan Avila, male    DOB: 23-Jun-1940, 83 y.o.   MRN: [id number]  HPI  Bryan Avila is a very pleasant 83 y.o. male with a history of right bundle branch block, pulmonary hypertension, persistent atrial fibrillation who presents today to discuss rectal bleeding.  His wife joins us  today.  Last night around 9pm he was urinating, wiped his rectal region and noticed a few drops bright red blood on the tissue paper. He typically wipes his rectal region after urination just to check for any residual stool. He did not have a bowel movement yesterday. He typically has bowel movements every 1-2 days. He does have a history of intermittent diarrhea. He began taking clindamycin  per podiatry on 11/02/23. He did not notice any blood today. He denies dark tarry stools, feeling constipated, hematuria, hemorrhoids, GI upset with clindamycin .   Currently managed on apixaban  5 mg twice daily for persistent atrial fibrillation which was initiated in February 2025.  His wife has noticed his urine being more dark than usual.  His last colonoscopy was in 2012, we do not have a copy.    Review of Systems  Constitutional:  Negative for fever.  Gastrointestinal:  Positive for blood in stool. Negative for abdominal pain, constipation, nausea and vomiting.  Genitourinary:  Negative for hematuria.         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   right popliteal fossa, excised 08/10/23    Social History   Socioeconomic History   Marital status: Married    Spouse name: Not on file   Number of children:  1   Years of education: Not on file   Highest education level: Not on file  Occupational History   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   Would not want Tube Feeds.   Does not have a living will or HPOA.   Social Drivers of Corporate investment banker Strain: Low Risk  (01/01/2023)   Overall Financial Resource Strain (CARDIA)    Difficulty of Paying Living Expenses: Not hard at all  Food Insecurity: No Food Insecurity (01/01/2023)   Hunger Vital Sign    Worried About Running Out of Food in the Last Year: Never true    Ran Out of Food in the Last Year: Never true  Transportation Needs: No Transportation Needs (01/01/2023)   PRAPARE - Administrator, Civil Service (Medical): No    Lack of Transportation (Non-Medical): No  Physical Activity: Sufficiently Active (01/01/2023)   Exercise Vital Sign    Days of Exercise per Week: 6 days  Minutes of Exercise per Session: 30 min  Stress: No Stress Concern Present (01/01/2023)   [name] of Occupational Health - Occupational Stress Questionnaire    Feeling of Stress : Not at all  Social Connections: Socially Integrated (01/01/2023)   Social Connection and Isolation Panel    Frequency of Communication with Friends and Family: Twice a week    Frequency of Social Gatherings with Friends and Family: Twice a week    Attends Religious Services: More than 4 times per year    Active Member of [employer] or Organizations: Yes    Attends Banker Meetings: Never    Marital Status: Married  Catering manager Violence: Not At Risk (01/04/2022)    Humiliation, Afraid, Rape, and Kick questionnaire    Fear of Current or Ex-Partner: No    Emotionally Abused: No    Physically Abused: No    Sexually Abused: No    Past Surgical History:  Procedure Laterality Date   EYE SURGERY     thumb surgery  1999   right    Family History  Problem Relation Age of Onset   Arthritis Father    Sleep apnea Father    Alcohol abuse Brother    Prostate cancer Brother    Cancer Paternal Grandfather        prostate    Allergies  Allergen Reactions   Meperidine Other (See Comments) and Nausea Only    I almost died from that.-Severe bradycardia Other reaction(s): Unknown DECREASED BLOOD PRESSURE I almost died from that.-Severe bradycardia     Current Outpatient Medications on File Prior to Visit  Medication Sig Dispense Refill   acyclovir (ZOVIRAX) 400 MG tablet Take 400 mg by mouth 2 (two) times daily.  99   apixaban  (ELIQUIS ) 5 MG TABS tablet Take 1 tablet (5 mg total) by mouth 2 (two) times daily. 180 tablet 1   clindamycin  (CLEOCIN ) 150 MG capsule Take 1 capsule (150 mg total) by mouth 3 (three) times daily. 30 capsule 1   furosemide  (LASIX ) 20 MG tablet Take 1 tablet (20 mg total) by mouth every other day. 45 tablet 3   losartan  (COZAAR ) 25 MG tablet Take 0.5 tablets (12.5 mg total) by mouth daily. 90 tablet 3   metoprolol  succinate (TOPROL -XL) 25 MG 24 hr tablet Take 1 tablet (25 mg total) by mouth daily. Take with or immediately following a meal. 90 tablet 3   Multiple Vitamins-Minerals (PRESERVISION AREDS PO) Take by mouth 2 (two) times daily.     mupirocin  ointment (BACTROBAN ) 2 % Apply 1 Application topically daily. 22 g 0   potassium chloride  SA (KLOR-CON  M20) 20 MEQ tablet Take 1 tablet (20 mEq total) by mouth every other day. 45 tablet 3   timolol (BETIMOL) 0.5 % ophthalmic solution Place 1 drop into the right eye daily.     NEOMYCIN -POLYMYXIN-HYDROCORTISONE (CORTISPORIN) 1 % SOLN OTIC solution Apply 1-2 drops to toe BID after  soaking (Patient not taking: Reported on 11/08/2023) 10 mL 1   No current facility-administered medications on file prior to visit.    BP (!) 102/58   Pulse 80   Temp 97.8 F (36.6 C) (Temporal)   Ht 5' 6.5 (1.689 m)   Wt 152 lb (68.9 kg)   SpO2 94%   BMI 24.17 kg/m  Objective:   Physical Exam Exam conducted with a chaperone present.   Cardiovascular:     Rate and Rhythm: Normal rate. Rhythm irregular.  Pulmonary:     Effort: Pulmonary  effort is normal.     Breath sounds: Normal breath sounds.  Genitourinary:    Rectum: Guaiac result positive. Tenderness present. No anal fissure, external hemorrhoid or internal hemorrhoid.   Musculoskeletal:     Cervical back: Neck supple.   Skin:    General: Skin is warm and dry.   Neurological:     Mental Status: He is alert and oriented to person, place, and time.   Psychiatric:        Mood and Affect: Mood normal.           Assessment & Plan:  Rectal bleeding Assessment & Plan: With positive occult card today. Exam today reassuring.  CBC and ferritin pending today. Referral placed to GI.  Urinalysis today with 1+ blood.  Urine microscopic ordered and pending.  Spoke with cardiology today who recommends lab work and continuation of apixaban  for now. Patient was instructed to contact our office immediately if he notices any further rectal bleeding.  He agrees.   Orders: -     POCT occult blood stool -     Ambulatory referral to Gastroenterology -     CBC -     Ferritin  Dark urine -     POCT Urinalysis Dipstick (Automated) -     Urine Microscopic  Microscopic hematuria Assessment & Plan: Urinalysis today with 1+ blood, otherwise negative. Urine microscopic ordered and pending.  Consulted with cardiology today.  See notes under rectal bleeding.  Orders: -     Urine Microscopic        Comer MARLA Gaskins, NP

## 2023-11-08 NOTE — Assessment & Plan Note (Addendum)
 With positive occult card today. Exam today reassuring.  CBC and ferritin pending today. Referral placed to GI.  Urinalysis today with 1+ blood.  Urine microscopic ordered and pending.  Spoke with cardiology today who recommends lab work and continuation of apixaban  for now. Patient was instructed to contact our office immediately if he notices any further rectal bleeding.  He agrees.

## 2023-11-08 NOTE — Patient Instructions (Addendum)
 You will either be contacted via phone regarding your referral to GI, or you may receive a letter on your MyChart portal from our referral team with instructions for scheduling an appointment. Please let us  know if you have not been contacted by anyone within two weeks.  Continue taking the Eliquis  medication. I spoke with your heart doctor.   Stop by the lab prior to leaving today. I will notify you of your results once received.   It was a pleasure to see you today!

## 2023-11-08 NOTE — Assessment & Plan Note (Signed)
 Urinalysis today with 1+ blood, otherwise negative. Urine microscopic ordered and pending.  Consulted with cardiology today.  See notes under rectal bleeding.

## 2023-11-09 ENCOUNTER — Ambulatory Visit: Payer: Self-pay | Admitting: Primary Care

## 2023-11-09 ENCOUNTER — Other Ambulatory Visit: Payer: Self-pay | Admitting: Primary Care

## 2023-11-09 DIAGNOSIS — K625 Hemorrhage of anus and rectum: Secondary | ICD-10-CM

## 2023-11-09 LAB — CBC
HCT: 40.5 % (ref 39.0–52.0)
Hemoglobin: 13.5 g/dL (ref 13.0–17.0)
MCHC: 33.3 g/dL (ref 30.0–36.0)
MCV: 92.2 fl (ref 78.0–100.0)
Platelets: 251 10*3/uL (ref 150.0–400.0)
RBC: 4.4 Mil/uL (ref 4.22–5.81)
RDW: 15.2 % (ref 11.5–15.5)
WBC: 7.2 10*3/uL (ref 4.0–10.5)

## 2023-11-09 LAB — FERRITIN: Ferritin: 26.4 ng/mL (ref 22.0–322.0)

## 2023-11-09 LAB — URINALYSIS, MICROSCOPIC ONLY
RBC / HPF: NONE SEEN (ref 0–?)
WBC, UA: NONE SEEN (ref 0–?)

## 2023-11-18 ENCOUNTER — Ambulatory Visit: Attending: Physician Assistant

## 2023-11-18 DIAGNOSIS — I428 Other cardiomyopathies: Secondary | ICD-10-CM | POA: Diagnosis present

## 2023-11-18 LAB — ECHOCARDIOGRAM COMPLETE
AR max vel: 3.16 cm2
AV Area VTI: 3.07 cm2
AV Area mean vel: 2.92 cm2
AV Mean grad: 3 mmHg
AV Peak grad: 5.4 mmHg
Ao pk vel: 1.16 m/s
Calc EF: 49.7 %
P 1/2 time: 1121 ms
S' Lateral: 2.7 cm
Single Plane A2C EF: 54.1 %
Single Plane A4C EF: 47.7 %

## 2023-11-30 ENCOUNTER — Ambulatory Visit (INDEPENDENT_AMBULATORY_CARE_PROVIDER_SITE_OTHER): Admitting: Podiatry

## 2023-11-30 ENCOUNTER — Encounter: Payer: Self-pay | Admitting: Podiatry

## 2023-11-30 DIAGNOSIS — B351 Tinea unguium: Secondary | ICD-10-CM | POA: Diagnosis not present

## 2023-11-30 DIAGNOSIS — M79676 Pain in unspecified toe(s): Secondary | ICD-10-CM

## 2023-11-30 NOTE — Progress Notes (Signed)
 He presents today for follow-up of his matrixectomy hallux left.  States that seems to be doing better and the toenails are kind of long.  Objective: Vitals are stable oriented x 3 hallux tibial border right demonstrates well-healed matrixectomy.  No erythema edema cellulitis drainage or odor.  Toenails are long thick yellow dystrophic like mycotic and tender on palpation as well as debridement.  Assessment: Pain in limb secondary to onychomycosis.  Well-healing matrixectomy hallux right.  Plan: Discussed etiology pathology conservative surgical therapies at this point performed a nail debridement 1 through 5 bilaterally.  Follow-up with him on an as-needed basis and scheduled him with Dr. May.

## 2023-12-01 ENCOUNTER — Ambulatory Visit: Attending: Physician Assistant | Admitting: Physician Assistant

## 2023-12-01 ENCOUNTER — Encounter: Payer: Self-pay | Admitting: Physician Assistant

## 2023-12-01 VITALS — BP 118/66 | HR 72 | Ht 66.5 in | Wt 151.6 lb

## 2023-12-01 DIAGNOSIS — G4733 Obstructive sleep apnea (adult) (pediatric): Secondary | ICD-10-CM | POA: Insufficient documentation

## 2023-12-01 DIAGNOSIS — I5022 Chronic systolic (congestive) heart failure: Secondary | ICD-10-CM | POA: Diagnosis present

## 2023-12-01 DIAGNOSIS — I428 Other cardiomyopathies: Secondary | ICD-10-CM | POA: Insufficient documentation

## 2023-12-01 DIAGNOSIS — I272 Pulmonary hypertension, unspecified: Secondary | ICD-10-CM | POA: Insufficient documentation

## 2023-12-01 DIAGNOSIS — Z79899 Other long term (current) drug therapy: Secondary | ICD-10-CM | POA: Insufficient documentation

## 2023-12-01 DIAGNOSIS — I872 Venous insufficiency (chronic) (peripheral): Secondary | ICD-10-CM | POA: Insufficient documentation

## 2023-12-01 DIAGNOSIS — I071 Rheumatic tricuspid insufficiency: Secondary | ICD-10-CM | POA: Insufficient documentation

## 2023-12-01 DIAGNOSIS — I4819 Other persistent atrial fibrillation: Secondary | ICD-10-CM | POA: Insufficient documentation

## 2023-12-01 DIAGNOSIS — K625 Hemorrhage of anus and rectum: Secondary | ICD-10-CM | POA: Insufficient documentation

## 2023-12-01 NOTE — Patient Instructions (Signed)
 Medication Instructions:  Your physician recommends that you continue on your current medications as directed. Please refer to the Current Medication list given to you today.   *If you need a refill on your cardiac medications before your next appointment, please call your pharmacy*  Lab Work: Your provider would like for you to have following labs drawn today CBC and BMeT.   If you have labs (blood work) drawn today and your tests are completely normal, you will receive your results only by: MyChart Message (if you have MyChart) OR A paper copy in the mail If you have any lab test that is abnormal or we need to change your treatment, we will call you to review the results.  Testing/Procedures: None ordered at this time   Follow-Up: At [facility], you and your health needs are our priority.  As part of our continuing mission to provide you with exceptional heart care, our providers are all part of one team.  This team includes your primary Cardiologist (physician) and Advanced Practice Providers or APPs (Physician Assistants and Nurse Practitioners) who all work together to provide you with the care you need, when you need it.  Your next appointment:   In September

## 2023-12-01 NOTE — Progress Notes (Signed)
 Cardiology Office Note    Date:  12/01/2023   ID:  Bryan Avila, DOB Mar 13, 1941, MRN [id number]  PCP:  Gretta Comer POUR, NP  Cardiologist:  Evalene Lunger, MD  Electrophysiologist:  None   Chief Complaint: Follow up  History of Present Illness:   Bryan Avila is a 83 y.o. male with history of dilated cardiomyopathy with HFmrEF, pulmonary hypertension, persistent A-fib, tricuspid regurgitation, RBBB, recently diagnosed OSA now on CPAP, and venous insufficiency who presents for follow-up of cardiomyopathy and Afib.   He was previously evaluated in 2012 for dizziness and RBBB with further testing deferred at that time.  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  More recently, he was evaluated at his PCP's office in 05/2023 for lower extremity swelling felt to be related to chronic venous insufficiency noted improvement in compression stockings and leg elevation.  BNP was obtained and found to be mildly elevated at 231.  Given this, he underwent echo in 06/2023 that showed an EF of 45 to 50%, global hypokinesis, low normal RV systolic function with severely enlarged RV cavity size, moderately elevated RVSP estimated at 45.5 mmHg, severe biatrial enlargement, mild mitral regurgitation, moderate to severe tricuspid regurgitation, mild aortic insufficiency, and an estimated right atrial pressure of 15 mmHg.  During the study the patient was noted to be in A-fib.  He followed up with Dr. Gollan on 07/04/2023 for persistent A-fib with unclear timing of onset, dilated cardiomyopathy, and valvular heart disease.  It was recommended he start apixaban  along with metoprolol  25 mg daily and furosemide  20 mg every other day.  Lower extremity venous ultrasound in 07/2023 showed no evidence of DVT bilaterally with venous reflux noted bilaterally.  He was seen in the office on 08/04/2023 noting improvement in lower extremity  swelling following leg elevation, compression socks, and addition of furosemide .  He preferred to pursue rate control strategy over rhythm control, and GDMT was escalated with the addition of losartan .  He was last seen in the office in 09/2023 and was doing well from a cardiac perspective which much improved lower extremity swelling with leg elevation and compression socks.  He was without symptoms without symptoms of angina or cardiac decompensation and continued to prefer rate control strategy.  Echo on 11/18/2023 showed an EF of 45 to 50%, global hypokinesis, low normal RV systolic function with moderately enlarged ventricular cavity size and normal RVSP, moderate biatrial enlargement, trivial mitral regurgitation, mild tricuspid regurgitation, mild aortic insufficiency, aortic valve sclerosis without evidence of stenosis, and mild dilatation of the aortic root measuring 39 mm.  He was evaluated at his PCP's office earlier this month for GI bleed and also found to have microscopic hematuria with 1+ blood.  CBC obtained at that time showed a stable hemoglobin of 13.5 with cardiology recommending to continue apixaban  at that time with close monitoring of symptoms and hemoglobin.  He comes in accompanied by his wife today and continues to do well from a cardiac perspective, without symptoms of angina or cardiac decompensation.  No palpitations, dizziness, PCP, or syncope.  No falls, further hematochezia, or melena.  He reports an isolated episode of frank red blood on the toilet tissue last month after a BM.  He has been asymptomatic since.  No frank hematuria.  No lower extremity swelling or progressive orthopnea.  Weight stable.  Overall feels like he is doing well  and does not have any acute cardiac concerns at this time.   Labs independently reviewed: 11/2023 - Hgb 13.5, PLT 251 09/2023 - BUN 20, serum creatinine 0.96, potassium 4.7 07/2023 - TSH normal, magnesium 2.3 01/2023 - albumin 3.7, AST/ALT normal,  TC 181, TG 79, HDL 70, LDL 96 10/2017 - A1c 5.9  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   apixaban  (ELIQUIS ) 5 MG TABS tablet Take 1 tablet (5 mg total) by mouth 2 (two) times daily.   clindamycin  (CLEOCIN ) 150 MG capsule Take 1 capsule (150 mg total) by mouth 3 (three) times daily.   furosemide  (LASIX ) 20 MG tablet Take 1 tablet (20 mg total) by mouth every other day.   losartan  (COZAAR ) 25 MG tablet Take 0.5 tablets (12.5 mg total) by mouth daily.   metoprolol  succinate (TOPROL -XL) 25 MG 24 hr tablet Take 1 tablet (25 mg total) by mouth daily. Take with or immediately following a meal.   Multiple Vitamins-Minerals (PRESERVISION AREDS PO) Take by mouth 2 (two) times daily.   mupirocin  ointment (BACTROBAN ) 2 % Apply 1 Application topically daily.   potassium chloride  SA (KLOR-CON  M20) 20 MEQ tablet Take 1 tablet (20 mEq total) by mouth every other day.   timolol (BETIMOL) 0.5 % ophthalmic solution Place 1 drop into the right eye daily.    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   Would not want Tube Feeds.   Does not have a living will or HPOA.   Social Drivers of Corporate investment banker Strain: Low Risk  (01/01/2023)   Overall Financial Resource Strain (CARDIA)    Difficulty of Paying Living Expenses: Not hard at all  Food Insecurity: No Food Insecurity (01/01/2023)   Hunger Vital Sign    Worried About Running Out of Food in the Last Year: Never true    Ran Out of Food in the Last Year: Never true  Transportation Needs: No Transportation Needs (01/01/2023)   PRAPARE - Transportation  Lack of Transportation (Medical): No    Lack of Transportation (Non-Medical): No  Physical Activity: Sufficiently Active (01/01/2023)   Exercise Vital Sign    Days of Exercise per Week: 6 days    Minutes of Exercise per Session: 30 min  Stress: No Stress Concern Present (01/01/2023)   [name] of Occupational Health - Occupational Stress Questionnaire    Feeling of Stress : Not at all  Social Connections: Socially Integrated (01/01/2023)   Social Connection and Isolation Panel    Frequency of Communication with Friends and Family: Twice a week    Frequency of Social Gatherings with Friends and Family: Twice a week    Attends Religious Services: More than 4 times per year    Active Member of [employer] or Organizations: Yes    Attends Banker Meetings: Never    Marital Status: Married     Family History:  The patient's family history includes Alcohol abuse in his brother; Arthritis in his father; Cancer in his paternal grandfather; Prostate cancer in his brother; Sleep apnea in his  father.  ROS:   12-point review of systems is negative unless otherwise noted in the HPI.   EKGs/Labs/Other Studies Reviewed:    Studies reviewed were summarized above. The additional studies were reviewed today:  2D echo 11/18/2023: 1. Left ventricular ejection fraction, by estimation, is 45 to 50%. Left  ventricular ejection fraction by 2D MOD biplane is 49.7 %. The left  ventricle has mildly decreased function. The left ventricle demonstrates  global hypokinesis. Left ventricular  diastolic parameters are indeterminate.   2. Right ventricular systolic function is low normal. The right  ventricular size is moderately enlarged. There is normal pulmonary artery  systolic pressure.   3. Left atrial size was moderately dilated.   4. Right atrial size was moderately dilated.   5. The mitral valve is normal in structure. Trivial mitral valve  regurgitation.   6. The aortic valve is tricuspid. Aortic valve regurgitation is mild.  Aortic valve sclerosis/calcification is present, without any evidence of  aortic stenosis.   7. Aortic dilatation noted. There is mild dilatation of the aortic root,  measuring 39 mm.   8. The inferior vena cava is dilated in size with <50% respiratory  variability, suggesting right atrial pressure of 15 mmHg.  __________  2D echo 06/27/2023: 1. Left ventricular ejection fraction, by estimation, is 45 to 50%. The  left ventricle has mildly decreased function. The left ventricle  demonstrates global hypokinesis. Left ventricular diastolic parameters are  indeterminate. The global longitudinal  strain is indeterminate.   2. Right ventricular systolic function is low normal. The right  ventricular size is severely enlarged. There is moderately elevated  pulmonary artery systolic pressure. The estimated right ventricular  systolic pressure is 45.5 mmHg.   3. Left atrial size was severely dilated.   4. Right atrial size was severely dilated.   5. The mitral  valve is normal in structure. Mild mitral valve  regurgitation.   6. Tricuspid valve regurgitation is moderate to severe.   7. The aortic valve is tricuspid. Aortic valve regurgitation is mild.   8. The inferior vena cava is dilated in size with <50% respiratory  variability, suggesting right atrial pressure of 15 mmHg.  _________   Lower extremity venous reflux ultrasound 07/14/2023: Summary:  Right:  - No evidence of deep vein thrombosis seen in the right lower extremity,  from the common femoral through the popliteal veins.  - No evidence of  superficial venous thrombosis in the right lower  extremity.  - Venous reflux is noted in the right greater saphenous vein in the thigh.  - Venous reflux is noted in the right femoral vein.  - Venous reflux is noted in the right popliteal vein.  - Venous reflux is noted in the right short saphenous vein.    Left:  - No evidence of deep vein thrombosis seen in the left lower extremity,  from the common femoral through the popliteal veins.  - No evidence of superficial venous thrombosis in the left lower  extremity.  - No evidence of superficial venous reflux seen in the left short  saphenous vein.  - Venous reflux is noted in the left sapheno-femoral junction.  - Venous reflux is noted in the left greater saphenous vein in the thigh.  - Venous reflux is noted in the left greater saphenous vein in the calf.  - Venous reflux is noted in the left femoral vein.   EKG:  EKG is ordered today.  The EKG ordered today demonstrates A-fib, 72 bpm, rare PVC versus aberrancy, baseline artifact, RBBB, consistent with prior tracings  Recent Labs: 01/13/2023: ALT 11 05/31/2023: Pro B Natriuretic peptide (BNP) 231.0 08/04/2023: Magnesium 2.3; TSH 2.430 10/04/2023: BUN 20; Creatinine, Ser 0.96; Potassium 4.7; Sodium 142 11/08/2023: Hemoglobin 13.5; Platelets 251.0  Recent Lipid Panel    Component Value Date/Time   CHOL 181 01/13/2023 1034   TRIG 79.0  01/13/2023 1034   HDL 70.00 01/13/2023 1034   CHOLHDL 3 01/13/2023 1034   VLDL 15.8 01/13/2023 1034   LDLCALC 96 01/13/2023 1034    PHYSICAL EXAM:    VS:  BP 118/66   Pulse 72   Ht 5' 6.5 (1.689 m)   Wt 151 lb 9.6 oz (68.8 kg)   SpO2 99%   BMI 24.10 kg/m   BMI: Body mass index is 24.1 kg/m.  Physical Exam Vitals reviewed.  Constitutional:      Appearance: He is well-developed.  HENT:     Head: Normocephalic and atraumatic.  Eyes:     General:        Right eye: No discharge.        Left eye: No discharge.  Neck:     Vascular: No JVD.  Cardiovascular:     Rate and Rhythm: Normal rate. Rhythm irregularly irregular.     Pulses:          Posterior tibial pulses are 2+ on the right side and 2+ on the left side.     Heart sounds: S1 normal and S2 normal. Heart sounds not distant. No midsystolic click and no opening snap. Murmur heard.     Systolic murmur is present with a grade of 2/6 at the upper left sternal border.     No friction rub.  Pulmonary:     Effort: Pulmonary effort is normal. No respiratory distress.     Breath sounds: Normal breath sounds. No decreased breath sounds, wheezing, rhonchi or rales.  Chest:     Chest wall: No tenderness.  Musculoskeletal:     Cervical back: Normal range of motion.  Skin:    General: Skin is warm and dry.     Nails: There is no clubbing.  Neurological:     Mental Status: He is alert and oriented to person, place, and time.  Psychiatric:        Speech: Speech normal.        Behavior: Behavior normal.  Thought Content: Thought content normal.        Judgment: Judgment normal.     Wt Readings from Last 3 Encounters:  12/01/23 151 lb 9.6 oz (68.8 kg)  11/08/23 152 lb (68.9 kg)  10/04/23 151 lb 12.8 oz (68.9 kg)     ASSESSMENT & PLAN:   Persistent A-fib: He remains in A-fib of uncertain chronicity with controlled ventricular response.  He is asymptomatic.  He continues to request we pursue rate control strategy.   With dilated left atrium measuring 52 mm, maintaining sinus rhythm may be challenging, particularly without antiarrhythmic therapy.  He remains on Toprol -XL 25 mg.  CHA2DS2-VASc at least 3 (CHF, age x 2).  Continue apixaban  5 mg twice daily, does not currently meet reduced dosing criteria.  No falls.  Reports an isolated episode of bright red blood on the toilet tissue after BM last month, no episodes since.  Hgb normal at that time.  Check CBC, if this remains stable would not interrupt anticoagulation at this time.  Dilated HFmrEF: Unclear etiology at this time. He appears euvolemic and well compensated with NYHA class II symptoms, though some of this may be due to advanced age and deconditioning.  Given he is without symptoms of angina or dyspnea, he prefers to defer further cardiac testing at this time, including ischemic testing.  Remains on losartan  12.5 mg and Toprol -XL 25 mg along with furosemide  20 mg every other day.  Check BMP.  If renal function and potassium allow would consider addition of spironolactone 12.5 mg with a follow-up BMP 1 week thereafter.  Tricuspid regurgitation: Mild by echo earlier this month following diuresis.   Chronic venous insufficiency: Much improved with leg elevation and compression socks. Followed by vascular surgery.   OSA: CPAP.  Followed by pulmonology.   GI bleed: Reports an isolated episode of bright red blood on the toilet tissue after BM last month, no episodes since.  Hgb normal at that time.  Check CBC.  Has been referred to GI.     Disposition: F/u with Dr. Gollan or an APP in as scheduled in 01/2024.   Medication Adjustments/Labs and Tests Ordered: Current medicines are reviewed at length with the patient today.  Concerns regarding medicines are outlined above. Medication changes, Labs and Tests ordered today are summarized above and listed in the Patient Instructions accessible in Encounters.   Signed, Bryan Bring, PA-C 12/01/2023 11:53 AM      [facility] [place] [address] [place], [place] [address] [phone]

## 2023-12-02 ENCOUNTER — Ambulatory Visit: Payer: Self-pay | Admitting: Physician Assistant

## 2023-12-02 LAB — BASIC METABOLIC PANEL WITH GFR
BUN/Creatinine Ratio: 17 (ref 10–24)
BUN: 17 mg/dL (ref 8–27)
CO2: 23 mmol/L (ref 20–29)
Calcium: 9.8 mg/dL (ref 8.6–10.2)
Chloride: 103 mmol/L (ref 96–106)
Creatinine, Ser: 0.98 mg/dL (ref 0.76–1.27)
Glucose: 88 mg/dL (ref 70–99)
Potassium: 4.5 mmol/L (ref 3.5–5.2)
Sodium: 141 mmol/L (ref 134–144)
eGFR: 77 mL/min/1.73 (ref >59)

## 2023-12-02 LAB — CBC
Hematocrit: 41.1 % (ref 37.5–51.0)
Hemoglobin: 13.9 g/dL (ref 13.0–17.7)
MCH: 32.5 pg (ref 26.6–33.0)
MCHC: 33.8 g/dL (ref 31.5–35.7)
MCV: 96 fL (ref 79–97)
Platelets: 253 x10E3/uL (ref 150–450)
RBC: 4.28 x10E6/uL (ref 4.14–5.80)
RDW: 13.3 % (ref 11.6–15.4)
WBC: 7.4 x10E3/uL (ref 3.4–10.8)

## 2024-01-11 ENCOUNTER — Encounter: Payer: Self-pay | Admitting: Physician Assistant

## 2024-01-11 ENCOUNTER — Other Ambulatory Visit: Payer: Self-pay

## 2024-01-11 ENCOUNTER — Ambulatory Visit: Attending: Physician Assistant | Admitting: Physician Assistant

## 2024-01-11 VITALS — BP 127/75 | HR 71 | Resp 20 | Ht 66.5 in | Wt 152.2 lb

## 2024-01-11 DIAGNOSIS — G4733 Obstructive sleep apnea (adult) (pediatric): Secondary | ICD-10-CM | POA: Diagnosis present

## 2024-01-11 DIAGNOSIS — I071 Rheumatic tricuspid insufficiency: Secondary | ICD-10-CM | POA: Insufficient documentation

## 2024-01-11 DIAGNOSIS — I872 Venous insufficiency (chronic) (peripheral): Secondary | ICD-10-CM | POA: Insufficient documentation

## 2024-01-11 DIAGNOSIS — I5022 Chronic systolic (congestive) heart failure: Secondary | ICD-10-CM | POA: Diagnosis present

## 2024-01-11 DIAGNOSIS — K625 Hemorrhage of anus and rectum: Secondary | ICD-10-CM | POA: Diagnosis present

## 2024-01-11 DIAGNOSIS — I4819 Other persistent atrial fibrillation: Secondary | ICD-10-CM | POA: Diagnosis present

## 2024-01-11 NOTE — Progress Notes (Signed)
 Cardiology Office Note    Date:  01/11/2024   ID:  Bryan Avila, DOB 1940-05-17, MRN [id number]  PCP:  Gretta Comer POUR, NP  Cardiologist:  Evalene Lunger, MD  Electrophysiologist:  None   Chief Complaint: Follow-up  History of Present Illness:   Bryan Avila is a 83 y.o. male with history of dilated cardiomyopathy with HFmrEF, pulmonary hypertension, persistent A-fib, tricuspid regurgitation, RBBB, recently diagnosed OSA now on CPAP, and venous insufficiency who presents for follow-up of cardiomyopathy and Afib.   He was previously evaluated in 2012 for dizziness and RBBB with further testing deferred at that time.  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  He was evaluated at his PCP's office in 05/2023 for lower extremity swelling felt to be related to chronic venous insufficiency with noted improvement following addition of compression stockings and leg elevation.  BNP was obtained and found to be mildly elevated at 231.  Given this, he underwent echo in 06/2023 that showed an EF of 45 to 50%, global hypokinesis, low normal RV systolic function with severely enlarged RV cavity size, moderately elevated RVSP estimated at 45.5 mmHg, severe biatrial enlargement, mild mitral regurgitation, moderate to severe tricuspid regurgitation, mild aortic insufficiency, and an estimated right atrial pressure of 15 mmHg.  During the study the patient was noted to be in A-fib.  He followed up with Dr. Gollan on 07/04/2023 for persistent A-fib with unclear timing of onset, dilated cardiomyopathy, and valvular heart disease.  It was recommended he start apixaban  along with metoprolol  25 mg daily and furosemide  20 mg every other day.  Lower extremity venous ultrasound in 07/2023 showed no evidence of DVT bilaterally with venous reflux noted bilaterally.  He was seen in the office on 08/04/2023 noting improvement in lower  extremity swelling following leg elevation, compression socks, and addition of furosemide .  He preferred to pursue rate control strategy over rhythm control, and GDMT was escalated with the addition of losartan .  He was seen in the office in 09/2023 and was doing well from a cardiac perspective which much improved lower extremity swelling with leg elevation and compression socks.  He was without symptoms without symptoms of angina or cardiac decompensation and continued to prefer rate control strategy.  Echo on 11/18/2023 showed an EF of 45 to 50%, global hypokinesis, low normal RV systolic function with moderately enlarged ventricular cavity size and normal RVSP, moderate biatrial enlargement, trivial mitral regurgitation, mild tricuspid regurgitation, mild aortic insufficiency, aortic valve sclerosis without evidence of stenosis, and mild dilatation of the aortic root measuring 39 mm.   He was evaluated at his PCP's office in 11/2023 for GI bleed and also found to have microscopic hematuria with 1+ blood.  CBC obtained at that time showed a stable hemoglobin of 13.5 with cardiology recommending to continue apixaban  at that time with close monitoring of symptoms and hemoglobin.  He was referred to GI.  He was last seen in our office on 12/01/2023 for continued to do well from a cardiac perspective.  He reported an isolated episode of frank BRBPR on toilet tissue after a BM in the month prior and was without symptoms since.  Follow-up labs that showed stable hemoglobin.  He preferred to defer further cardiac testing regarding his cardiomyopathy at that time.  He comes in continuing to do very well from a cardiac perspective and remains without symptoms of  angina or cardiac decompensation.  No palpitations, dizziness, presyncope, or syncope.  No falls, or symptoms concerning for bleeding, including no further BRBPR.  Adherent to anticoagulation.  No lower extremity swelling or progressive orthopnea.  Weight stable.   Does not have any acute cardiac concerns at this time.   Labs independently reviewed: 11/2023 - BUN 17, serum creatinine 0.98, potassium 4.5, Hgb 13.9, PLT 253 07/2023 - TSH normal, magnesium 2.3 01/2023 - albumin 3.7, AST/ALT normal, TC 181, TG 79, HDL 70, LDL 96 10/2017 - A1c 5.9  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   apixaban  (ELIQUIS ) 5 MG TABS tablet Take 1 tablet (5 mg total) by mouth 2 (two) times daily.   clindamycin  (CLEOCIN ) 150 MG capsule Take 1 capsule (150 mg total) by mouth 3 (three) times daily.   furosemide  (LASIX ) 20 MG tablet Take 1 tablet (20 mg total) by mouth every other day.   losartan  (COZAAR ) 25 MG tablet Take 0.5 tablets (12.5 mg total) by mouth daily.   metoprolol  succinate (TOPROL -XL) 25 MG 24 hr tablet Take 1 tablet (25 mg total) by mouth daily. Take with or immediately following a meal.   Multiple Vitamins-Minerals (PRESERVISION AREDS PO) Take by mouth 2 (two) times daily.    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   Would not want Tube Feeds.   Does not have a living will or HPOA.   Social Drivers of Corporate investment banker Strain: Low Risk  (01/01/2023)   Overall Financial Resource Strain (CARDIA)    Difficulty of Paying Living Expenses: Not hard at all  Food Insecurity: No Food Insecurity (01/01/2023)   Hunger Vital Sign    Worried About Running Out of Food in the Last Year: Never true    Ran Out of Food in the Last Year: Never true  Transportation Needs: No Transportation Needs (01/01/2023)   PRAPARE - Administrator, Civil Service (Medical): No    Lack of Transportation (  Non-Medical): No  Physical Activity: Sufficiently Active (01/01/2023)   Exercise Vital Sign    Days of Exercise per Week: 6 days    Minutes of Exercise per Session: 30 min  Stress: No Stress Concern Present (01/01/2023)   [name] of Occupational Health - Occupational Stress Questionnaire    Feeling of Stress : Not at all  Social Connections: Socially Integrated (01/01/2023)   Social Connection and Isolation Panel    Frequency of Communication with Friends and Family: Twice a week    Frequency of Social Gatherings with Friends and Family: Twice a week    Attends Religious Services: More than 4 times per year    Active Member of [employer] or Organizations: Yes    Attends Banker Meetings: Never    Marital Status: Married     Family History:  The patient's family history includes Alcohol abuse in his brother; Arthritis in his father; Cancer in his paternal grandfather; Prostate cancer in his brother; Sleep apnea in his father.  ROS:   12-point review of  systems is negative unless otherwise noted in the HPI.   EKGs/Labs/Other Studies Reviewed:    Studies reviewed were summarized above. The additional studies were reviewed today:  2D echo 11/18/2023: 1. Left ventricular ejection fraction, by estimation, is 45 to 50%. Left  ventricular ejection fraction by 2D MOD biplane is 49.7 %. The left  ventricle has mildly decreased function. The left ventricle demonstrates  global hypokinesis. Left ventricular  diastolic parameters are indeterminate.   2. Right ventricular systolic function is low normal. The right  ventricular size is moderately enlarged. There is normal pulmonary artery  systolic pressure.   3. Left atrial size was moderately dilated.   4. Right atrial size was moderately dilated.   5. The mitral valve is normal in structure. Trivial mitral valve  regurgitation.   6. The aortic valve is tricuspid. Aortic valve regurgitation is mild.  Aortic valve sclerosis/calcification is present, without any evidence of  aortic stenosis.   7. Aortic dilatation noted. There is mild dilatation of the aortic root,  measuring 39 mm.   8. The inferior vena cava is dilated in size with <50% respiratory  variability, suggesting right atrial pressure of 15 mmHg.  __________   2D echo 06/27/2023: 1. Left ventricular ejection fraction, by estimation, is 45 to 50%. The  left ventricle has mildly decreased function. The left ventricle  demonstrates global hypokinesis. Left ventricular diastolic parameters are  indeterminate. The global longitudinal  strain is indeterminate.   2. Right ventricular systolic function is low normal. The right  ventricular size is severely enlarged. There is moderately elevated  pulmonary artery systolic pressure. The estimated right ventricular  systolic pressure is 45.5 mmHg.   3. Left atrial size was severely dilated.   4. Right atrial size was severely dilated.   5. The mitral valve is normal in structure. Mild  mitral valve  regurgitation.   6. Tricuspid valve regurgitation is moderate to severe.   7. The aortic valve is tricuspid. Aortic valve regurgitation is mild.   8. The inferior vena cava is dilated in size with <50% respiratory  variability, suggesting right atrial pressure of 15 mmHg.  _________   Lower extremity venous reflux ultrasound 07/14/2023: Summary:  Right:  - No evidence of deep vein thrombosis seen in the right lower extremity,  from the common femoral through the popliteal veins.  - No evidence of superficial venous thrombosis in the right lower  extremity.  -  Venous reflux is noted in the right greater saphenous vein in the thigh.  - Venous reflux is noted in the right femoral vein.  - Venous reflux is noted in the right popliteal vein.  - Venous reflux is noted in the right short saphenous vein.    Left:  - No evidence of deep vein thrombosis seen in the left lower extremity,  from the common femoral through the popliteal veins.  - No evidence of superficial venous thrombosis in the left lower  extremity.  - No evidence of superficial venous reflux seen in the left short  saphenous vein.  - Venous reflux is noted in the left sapheno-femoral junction.  - Venous reflux is noted in the left greater saphenous vein in the thigh.  - Venous reflux is noted in the left greater saphenous vein in the calf.  - Venous reflux is noted in the left femoral vein.   EKG:  EKG is ordered today.  The EKG ordered today demonstrates A-fib, 65 bpm, RBBB, consistent with prior tracing  Recent Labs: 01/13/2023: ALT 11 05/31/2023: Pro B Natriuretic peptide (BNP) 231.0 08/04/2023: Magnesium 2.3; TSH 2.430 12/01/2023: BUN 17; Creatinine, Ser 0.98; Hemoglobin 13.9; Platelets 253; Potassium 4.5; Sodium 141  Recent Lipid Panel    Component Value Date/Time   CHOL 181 01/13/2023 1034   TRIG 79.0 01/13/2023 1034   HDL 70.00 01/13/2023 1034   CHOLHDL 3 01/13/2023 1034   VLDL 15.8 01/13/2023 1034    LDLCALC 96 01/13/2023 1034    PHYSICAL EXAM:    VS:  BP 127/75 (BP Location: Left Arm, Patient Position: Sitting, Cuff Size: Normal)   Pulse 71   Resp 20   Ht 5' 6.5 (1.689 m)   Wt 152 lb 4 oz (69.1 kg)   BMI 24.21 kg/m   BMI: Body mass index is 24.21 kg/m.  Physical Exam Constitutional:      Appearance: He is well-developed.  HENT:     Head: Normocephalic and atraumatic.  Eyes:     General:        Right eye: No discharge.        Left eye: No discharge.  Cardiovascular:     Rate and Rhythm: Normal rate. Rhythm irregularly irregular.     Pulses:          Posterior tibial pulses are 2+ on the right side and 2+ on the left side.     Heart sounds: S1 normal and S2 normal. Heart sounds not distant. No midsystolic click and no opening snap. Murmur heard.     Systolic murmur is present with a grade of 2/6 at the upper left sternal border.     No friction rub.  Pulmonary:     Effort: Pulmonary effort is normal. No respiratory distress.     Breath sounds: Normal breath sounds. No decreased breath sounds, wheezing, rhonchi or rales.  Musculoskeletal:     Cervical back: Normal range of motion.     Right lower leg: No edema.     Left lower leg: No edema.  Skin:    General: Skin is warm and dry.     Nails: There is no clubbing.  Neurological:     Mental Status: He is alert and oriented to person, place, and time.  Psychiatric:        Speech: Speech normal.        Behavior: Behavior normal.        Thought Content: Thought content normal.  Judgment: Judgment normal.     Wt Readings from Last 3 Encounters:  01/11/24 152 lb 4 oz (69.1 kg)  12/01/23 151 lb 9.6 oz (68.8 kg)  11/08/23 152 lb (68.9 kg)     ASSESSMENT & PLAN:   Persistent A-fib: He remains in A-fib of uncertain chronicity with well-controlled ventricular response and is completely asymptomatic.  He has preferred rate control strategy over rhythm control.  With dilated left atrium measuring 52 mm,  maintaining sinus rhythm may be challenging, particularly without antiarrhythmic therapy.  He remains on metoprolol  XL 25 mg daily.  CHA2DS2-VASc at least 3 (CHF, age x 2).  Continue apixaban  5 mg twice daily, does not currently meet reduced dosing criteria.  No falls.  No further episodes of GI bleeding with isolated episode noted as outlined above.  He has an appointment scheduled with GI for further evaluation.  Hemoglobin has remained normal.  Dilated HFmrEF: Unclear etiology at this time.  He remains euvolemic and well compensated with NYHA class II symptoms, though some of this may be due to advanced age and deconditioning.  He does ambulate for 30 minutes daily without cardiac limitation.  Given he is without symptoms of angina or dyspnea, he has preferred to defer further cardiac testing at this time, including ischemic testing.  Prefers to minimize pharmacotherapy wherever possible.  Remains on losartan  12.5 mg and Toprol -XL 25 mg daily along with furosemide  20 mg every other day.  Recent labs stable.  Tricuspid regurgitation: Mild by echo in 11/2023 following diuresis.  Chronic venous insufficiency: Much improved with leg elevation, compression socks, and low-dose furosemide .  Followed by vascular surgery.  OSA: CPAP.  Followed by pulmonology.  GI bleed: As reported an isolated episode of BRBPR only toilet tissue after BM in 10/2023, none since.  Hemoglobin has remained stable.  Has appointment with GI.     Disposition: F/u with Dr. Gollan or an APP in 6 months.   Medication Adjustments/Labs and Tests Ordered: Current medicines are reviewed at length with the patient today.  Concerns regarding medicines are outlined above. Medication changes, Labs and Tests ordered today are summarized above and listed in the Patient Instructions accessible in Encounters.   Signed, Bernardino Bring, PA-C 01/11/2024 9:41 AM     [facility] [place] [address] [place], [place]  [address] [phone]

## 2024-01-11 NOTE — Patient Instructions (Signed)
 Medication Instructions:  Your physician recommends that you continue on your current medications as directed. Please refer to the Current Medication list given to you today.   *If you need a refill on your cardiac medications before your next appointment, please call your pharmacy*  Lab Work: None ordered at this time   Follow-Up: At [facility], you and your health needs are our priority.  As part of our continuing mission to provide you with exceptional heart care, our providers are all part of one team.  This team includes your primary Cardiologist (physician) and Advanced Practice Providers or APPs (Physician Assistants and Nurse Practitioners) who all work together to provide you with the care you need, when you need it.  Your next appointment:   6 month(s)  Provider:   You may see Timothy Gollan, MD or Varney Gentleman, PA-C

## 2024-01-17 ENCOUNTER — Ambulatory Visit (INDEPENDENT_AMBULATORY_CARE_PROVIDER_SITE_OTHER): Payer: Medicare Other

## 2024-01-17 VITALS — Ht 66.5 in | Wt 152.0 lb

## 2024-01-17 DIAGNOSIS — Z0000 Encounter for general adult medical examination without abnormal findings: Secondary | ICD-10-CM

## 2024-01-17 NOTE — Progress Notes (Signed)
 Subjective:   Bryan Avila is a 83 y.o. who presents for a Medicare Wellness preventive visit.  As a reminder, Annual Wellness Visits don't include a physical exam, and some assessments may be limited, especially if this visit is performed virtually. We may recommend an in-person follow-up visit with your provider if needed.  Visit Complete: Virtual I connected with  [name] on 01/17/24 by a audio enabled telemedicine application and verified that I am speaking with the correct person using two identifiers.  Patient Location: Home  Provider Location: Home Office  I discussed the limitations of evaluation and management by telemedicine. The patient expressed understanding and agreed to proceed.  Vital Signs: Because this visit was a virtual/telehealth visit, some criteria may be missing or patient reported. Any vitals not documented were not able to be obtained and vitals that have been documented are patient reported.  VideoDeclined- This patient declined Librarian, academic. Therefore the visit was completed with audio only.  Persons Participating in Visit: Patient.  AWV Questionnaire: No: Patient Medicare AWV questionnaire was not completed prior to this visit.  Cardiac Risk Factors include: advanced age (>52men, >17 women);male gender     Objective:    Today's Vitals   01/17/24 1018  Weight: 152 lb (68.9 kg)  Height: 5' 6.5 (1.689 m)   Body mass index is 24.17 kg/m.     01/17/2024   10:27 AM 01/06/2023    9:15 AM 01/04/2022    9:28 AM 12/06/2017    8:58 AM 11/25/2016   12:18 PM 12/16/2014    7:15 PM  Advanced Directives  Does Patient Have a Medical Advance Directive? No No No No  No  No   Does patient want to make changes to medical advance directive?     Yes (MAU/Ambulatory/Procedural Areas - Information given)    Would patient like information on creating a medical advance directive?  No - Patient declined No - Patient declined No -  Patient declined        Data saved with a previous flowsheet row definition    Current Medications (verified) Outpatient Encounter Medications as of 01/17/2024  Medication Sig   potassium chloride  SA (KLOR-CON  M20) 20 MEQ tablet Take 1 tablet (20 mEq total) by mouth every other day.   timolol (BETIMOL) 0.5 % ophthalmic solution Place 1 drop into the right eye daily.   acyclovir (ZOVIRAX) 400 MG tablet Take 400 mg by mouth 2 (two) times daily.   apixaban  (ELIQUIS ) 5 MG TABS tablet Take 1 tablet (5 mg total) by mouth 2 (two) times daily.   clindamycin  (CLEOCIN ) 150 MG capsule Take 1 capsule (150 mg total) by mouth 3 (three) times daily. (Patient not taking: Reported on 01/17/2024)   furosemide  (LASIX ) 20 MG tablet Take 1 tablet (20 mg total) by mouth every other day.   losartan  (COZAAR ) 25 MG tablet Take 0.5 tablets (12.5 mg total) by mouth daily.   metoprolol  succinate (TOPROL -XL) 25 MG 24 hr tablet Take 1 tablet (25 mg total) by mouth daily. Take with or immediately following a meal.   Multiple Vitamins-Minerals (PRESERVISION AREDS PO) Take by mouth 2 (two) times daily.   mupirocin  ointment (BACTROBAN ) 2 % Apply 1 Application topically daily. (Patient not taking: Reported on 01/17/2024)   NEOMYCIN -POLYMYXIN-HYDROCORTISONE (CORTISPORIN) 1 % SOLN OTIC solution Apply 1-2 drops to toe BID after soaking (Patient not taking: Reported on 01/17/2024)   No facility-administered encounter medications on file as of 01/17/2024.  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   right  popliteal fossa, excised 08/10/23   Past Surgical History:  Procedure Laterality Date   EYE SURGERY     thumb surgery  1999   right   Family History  Problem Relation Age of Onset   Arthritis Father    Sleep apnea Father    Alcohol abuse Brother    Prostate cancer Brother    Cancer Paternal Grandfather        prostate   Social History   Socioeconomic History   Marital status: Married    Spouse name: Not on file   Number of children: 1   Years of education: Not on file   Highest education level: Not on file  Occupational History   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   Would not want Tube Feeds.   Does not have a living will or HPOA.   Social Drivers of Corporate investment banker Strain: Low Risk  (01/17/2024)   Overall Financial Resource Strain (CARDIA)    Difficulty of Paying Living Expenses: Not very hard  Food Insecurity: No Food Insecurity (01/17/2024)   Hunger Vital Sign    Worried About Running Out of Food in the Last Year: Never true    Ran Out of Food in the Last Year: Never true  Transportation Needs: No Transportation Needs (01/17/2024)   PRAPARE - Administrator, Civil Service (Medical): No    Lack of Transportation (Non-Medical): No  Physical Activity: Sufficiently Active (01/17/2024)   Exercise Vital Sign    Days of Exercise per Week: 6 days    Minutes of Exercise per Session: 30 min  Stress: No Stress Concern Present (01/17/2024)   [name] of Occupational Health - Occupational Stress Questionnaire    Feeling of Stress: Not at all  Social Connections:  Socially Integrated (01/17/2024)   Social Connection and Isolation Panel    Frequency of Communication with Friends and Family: Twice a week    Frequency of Social Gatherings with Friends and Family: Twice a week    Attends Religious Services: More than 4 times per year    Active Member of [employer] or Organizations: Yes    Attends Banker Meetings: Never    Marital Status: Married    Tobacco Counseling Counseling given: Not Answered    Clinical Intake:  Pre-visit preparation completed: Yes  Pain : No/denies pain     BMI - recorded: 24.17 Nutritional Status: BMI of 19-24  Normal Nutritional Risks: None Diabetes: No  Lab Results  Component Value Date  HGBA1C 5.9 11/02/2017     How often do you need to have someone help you when you read instructions, pamphlets, or other written materials from your doctor or pharmacy?: 1 - Never  Interpreter Needed?: No  Comments: lives with wife Information entered by :: B.Jaxn Chiquito,LPN   Activities of Daily Living     01/17/2024   10:27 AM  In your present state of health, do you have any difficulty performing the following activities:  Hearing? 1  Vision? 0  Difficulty concentrating or making decisions? 0  Walking or climbing stairs? 0  Dressing or bathing? 0  Doing errands, shopping? 0  Preparing Food and eating ? N  Using the Toilet? N  In the past six months, have you accidently leaked urine? N  Do you have problems with loss of bowel control? N  Managing your Medications? N  Managing your Finances? N  Housekeeping or managing your Housekeeping? N    Patient Care Team: Gretta Comer POUR, NP as PCP - General (Internal Medicine) Perla Evalene PARAS, MD as PCP - Cardiology (Cardiology) Perla Evalene PARAS, MD as Consulting Physician (Cardiology) Jaye Fallow, MD as Referring Physician (Ophthalmology) [facility], [place] , MD (Inactive) as Consulting Physician (Dermatology)  I have updated your Care Teams any  recent Medical Services you may have received from other providers in the past year.     Assessment:   This is a routine wellness examination for Rayshun.  Hearing/Vision screen Hearing Screening - Comments:: Patient denies any hearing difficulties.   Vision Screening - Comments:: Pt says their vision is good with glasses [facility]   Goals Addressed               This Visit's Progress     COMPLETED: DIET - EAT MORE FRUITS AND VEGETABLES   On track     COMPLETED: Increase physical activity (pt-stated)   Not on track     Starting 11/25/2016, I will continue to walk for at least 30 minutes 5 days/week.      Increase physical activity   On track     01/17/24- I will continue to walk at least 30 minutes daily.       Patient advised to follow up for vaccines   On track     Patient Stated   On track     01/17/24-Maintain current health status.       Depression Screen     01/17/2024   10:25 AM 09/20/2023    3:21 PM 07/13/2023   10:04 AM 01/06/2023    9:11 AM 01/04/2022    9:27 AM 12/30/2020    9:24 AM 12/19/2018    9:08 AM  PHQ 2/9 Scores  PHQ - 2 Score 0 0 0 0 0 0 0  PHQ- 9 Score   0  0 0     Fall Risk     01/17/2024   10:22 AM 11/08/2023    2:53 PM 09/20/2023    3:21 PM 07/13/2023   10:03 AM 05/31/2023    8:40 AM  Fall Risk   Falls in the past year? 0 0 0 0 0  Number falls in past yr: 0 0 0 0 0  Injury with Fall? 0 0 0 0 0  Risk for fall due to : No Fall Risks No Fall Risks No Fall Risks No Fall Risks No Fall Risks  Follow up Education provided;Falls prevention discussed Falls evaluation completed Falls evaluation completed Falls evaluation completed Falls evaluation completed  MEDICARE RISK AT HOME:  Medicare Risk at Home Any stairs in or around the home?: Yes If so, are there any without handrails?: Yes Home free of loose throw rugs in walkways, pet beds, electrical cords, etc?: Yes Adequate lighting in your home to reduce risk of falls?: Yes Life alert?: No Use of a  cane, walker or w/c?: No Grab bars in the bathroom?: Yes Shower chair or bench in shower?: Yes Elevated toilet seat or a handicapped toilet?: No  TIMED UP AND GO:  Was the test performed?  No  Cognitive Function: 6CIT completed    12/06/2017    8:58 AM 11/25/2016   12:20 PM  MMSE - Mini Mental State Exam  Orientation to time 5 5   Orientation to Place 5 5   Registration 3 3   Attention/ Calculation 0 0   Recall 3 3   Language- name 2 objects 0 0   Language- repeat 1 1  Language- follow 3 step command 3 3   Language- read & follow direction 0 0   Write a sentence 0 0   Copy design 0 0   Total score 20 20      Data saved with a previous flowsheet row definition        01/17/2024   10:35 AM 01/06/2023    9:17 AM 01/04/2022    9:30 AM  6CIT Screen  What Year? 0 points 0 points 0 points  What month? 0 points 0 points 0 points  What time? 0 points 0 points 0 points  Count back from 20 0 points 0 points 0 points  Months in reverse 0 points 0 points 0 points  Repeat phrase 0 points 2 points 0 points  Total Score 0 points 2 points 0 points    Immunizations Immunization History  Administered Date(s) Administered   Fluad Quad(high Dose 65+) 01/09/2021   Fluad Trivalent(High Dose 65+) 01/13/2023   INFLUENZA, HIGH DOSE SEASONAL PF 01/28/2015, 01/24/2016, 02/04/2017, 02/07/2018   Influenza Whole 04/09/2000, 03/10/2007, 02/07/2008, 02/12/2009, 02/10/2010   Influenza-Unspecified 02/04/2017   PFIZER(Purple Top)SARS-COV-2 Vaccination 07/18/2019, 08/08/2019   PNEUMOCOCCAL CONJUGATE-20 01/13/2023   Pneumococcal Conjugate-13 11/19/2013   Pneumococcal Polysaccharide-23 03/31/2007   Td 05/10/1996, 10/28/2006   Tdap 12/16/2014, 02/28/2023   Zoster, Live 03/25/2010    Screening Tests Health Maintenance  Topic Date Due   Zoster Vaccines- Shingrix (1 of 2) 01/14/1960   Influenza Vaccine  12/09/2023   Medicare Annual Wellness (AWV)  01/16/2025   DTaP/Tdap/Td (5 - Td or Tdap)  02/27/2033   Pneumococcal Vaccine: 50+ Years  Completed   HPV VACCINES  Aged Out   Meningococcal B Vaccine  Aged Out   COVID-19 Vaccine  Discontinued    Health Maintenance Items Addressed: None due at this time. Pt will receive vaccines at their pharmcy when decided to obtain   Additional Screening:  Vision Screening: Recommended annual ophthalmology exams for early detection of glaucoma and other disorders of the eye. Is the patient up to date with their annual eye exam?  Yes  Who is the provider or what is the name of the office in which the patient attends annual eye exams? Dr Jaye  Dental Screening: Recommended annual dental exams for proper oral hygiene  Community Resource Referral / Chronic Care Management: CRR required this visit?  No   CCM required this visit?  No   Plan:    I have personally reviewed and noted the following in the patient's chart:   Medical and  social history Use of alcohol, tobacco or illicit drugs  Current medications and supplements including opioid prescriptions. Patient is not currently taking opioid prescriptions. Functional ability and status Nutritional status Physical activity Advanced directives List of other physicians Hospitalizations, surgeries, and ER visits in previous 12 months Vitals Screenings to include cognitive, depression, and falls Referrals and appointments  In addition, I have reviewed and discussed with patient certain preventive protocols, quality metrics, and best practice recommendations. A written personalized care plan for preventive services as well as general preventive health recommendations were provided to patient.   Erminio LITTIE Saris, LPN   [id number]   After Visit Summary: (MyChart) Due to this being a telephonic visit, the after visit summary with patients personalized plan was offered to patient via MyChart   Notes: Nothing significant to report at this time.

## 2024-01-17 NOTE — Patient Instructions (Signed)
 Bryan Avila,  Thank you for taking the time for your Medicare Wellness Visit. I appreciate your continued commitment to your health goals. Please review the care plan we discussed, and feel free to reach out if I can assist you further.  Medicare recommends these wellness visits once per year to help you and your care team stay ahead of potential health issues. These visits are designed to focus on prevention, allowing your provider to concentrate on managing your acute and chronic conditions during your regular appointments.  Please note that Annual Wellness Visits do not include a physical exam. Some assessments may be limited, especially if the visit was conducted virtually. If needed, we may recommend a separate in-person follow-up with your provider.  Ongoing Care Seeing your primary care provider every 3 to 6 months helps us  monitor your health and provide consistent, personalized care. 02/01/24  Referrals If a referral was made during today's visit and you haven't received any updates within two weeks, please contact the referred provider directly to check on the status.  Recommended Screenings:  Health Maintenance  Topic Date Due   Zoster (Shingles) Vaccine (1 of 2) 01/14/1960   Flu Shot  12/09/2023   Medicare Annual Wellness Visit  01/16/2025   DTaP/Tdap/Td vaccine (5 - Td or Tdap) 02/27/2033   Pneumococcal Vaccine for age over 68  Completed   HPV Vaccine  Aged Out   Meningitis B Vaccine  Aged Out   COVID-19 Vaccine  Discontinued       01/06/2023    9:15 AM  Advanced Directives  Does Patient Have a Medical Advance Directive? No  Would patient like information on creating a medical advance directive? No - Patient declined   Advance Care Planning is important because it: Ensures you receive medical care that aligns with your values, goals, and preferences. Provides guidance to your family and loved ones, reducing the emotional burden of decision-making during critical  moments.  Vision: Annual vision screenings are recommended for early detection of glaucoma, cataracts, and diabetic retinopathy. These exams can also reveal signs of chronic conditions such as diabetes and high blood pressure.  Dental: Annual dental screenings help detect early signs of oral cancer, gum disease, and other conditions linked to overall health, including heart disease and diabetes.

## 2024-01-19 ENCOUNTER — Ambulatory Visit (INDEPENDENT_AMBULATORY_CARE_PROVIDER_SITE_OTHER): Admitting: Dermatology

## 2024-01-19 ENCOUNTER — Encounter: Payer: Self-pay | Admitting: Dermatology

## 2024-01-19 ENCOUNTER — Encounter: Payer: Medicare Other | Admitting: Dermatology

## 2024-01-19 DIAGNOSIS — S90121A Contusion of right lesser toe(s) without damage to nail, initial encounter: Secondary | ICD-10-CM

## 2024-01-19 DIAGNOSIS — R233 Spontaneous ecchymoses: Secondary | ICD-10-CM

## 2024-01-19 DIAGNOSIS — D485 Neoplasm of uncertain behavior of skin: Secondary | ICD-10-CM

## 2024-01-19 DIAGNOSIS — L814 Other melanin hyperpigmentation: Secondary | ICD-10-CM | POA: Diagnosis not present

## 2024-01-19 DIAGNOSIS — L821 Other seborrheic keratosis: Secondary | ICD-10-CM

## 2024-01-19 DIAGNOSIS — D099 Carcinoma in situ, unspecified: Secondary | ICD-10-CM

## 2024-01-19 DIAGNOSIS — Z1283 Encounter for screening for malignant neoplasm of skin: Secondary | ICD-10-CM | POA: Diagnosis not present

## 2024-01-19 DIAGNOSIS — D0461 Carcinoma in situ of skin of right upper limb, including shoulder: Secondary | ICD-10-CM | POA: Diagnosis not present

## 2024-01-19 DIAGNOSIS — L578 Other skin changes due to chronic exposure to nonionizing radiation: Secondary | ICD-10-CM | POA: Diagnosis not present

## 2024-01-19 DIAGNOSIS — W908XXA Exposure to other nonionizing radiation, initial encounter: Secondary | ICD-10-CM

## 2024-01-19 DIAGNOSIS — Z85828 Personal history of other malignant neoplasm of skin: Secondary | ICD-10-CM

## 2024-01-19 DIAGNOSIS — D1801 Hemangioma of skin and subcutaneous tissue: Secondary | ICD-10-CM

## 2024-01-19 DIAGNOSIS — D229 Melanocytic nevi, unspecified: Secondary | ICD-10-CM

## 2024-01-19 HISTORY — DX: Carcinoma in situ, unspecified: D09.9

## 2024-01-19 NOTE — Patient Instructions (Addendum)

## 2024-01-19 NOTE — Progress Notes (Signed)
 Follow-Up Visit   Subjective  Bryan Avila is a 83 y.o. male who presents for the following: Skin Cancer Screening and Full Body Skin Exam  The patient presents for Total-Body Skin Exam (TBSE) for skin cancer screening and mole check. The patient has spots, moles and lesions to be evaluated, some may be new or changing and the patient may have concern these could be cancer.  The following portions of the chart were reviewed this encounter and updated as appropriate: medications, allergies, medical history  Review of Systems:  No other skin or systemic complaints except as noted in HPI or Assessment and Plan.  Objective  Well appearing patient in no apparent distress; mood and affect are within normal limits.  A full examination was performed including scalp, head, eyes, ears, nose, lips, neck, chest, axillae, abdomen, back, buttocks, bilateral upper extremities, bilateral lower extremities, hands, feet, fingers, toes, fingernails, and toenails. All findings within normal limits unless otherwise noted below.   Relevant physical exam findings are noted in the Assessment and Plan.  R dorsal hand 4th-5th MCP 2.0 cm pink scaly plaque.    Assessment & Plan   SKIN CANCER SCREENING PERFORMED TODAY.  ACTINIC DAMAGE - Chronic condition, secondary to cumulative UV/sun exposure - diffuse scaly erythematous macules with underlying dyspigmentation - Recommend daily broad spectrum sunscreen SPF 30+ to sun-exposed areas, reapply every 2 hours as needed.  - Staying in the shade or wearing long sleeves, sun glasses (UVA+UVB protection) and wide brim hats (4-inch brim around the entire circumference of the hat) are also recommended for sun protection.  - Call for new or changing lesions.  LENTIGINES, SEBORRHEIC KERATOSES, HEMANGIOMAS - Benign normal skin lesions - Benign-appearing - Call for any changes  MELANOCYTIC NEVI - Tan-brown and/or pink-flesh-colored symmetric macules and papules -  Benign appearing on exam today - Observation - Call clinic for new or changing moles - Recommend daily use of broad spectrum spf 30+ sunscreen to sun-exposed areas.   HISTORY OF BASAL CELL CARCINOMA OF THE SKIN - No evidence of recurrence today - Recommend regular full body skin exams - Recommend daily broad spectrum sunscreen SPF 30+ to sun-exposed areas, reapply every 2 hours as needed.  - Call if any new or changing lesions are noted between office visits  HISTORY OF SQUAMOUS CELL CARCINOMA OF THE SKIN - No evidence of recurrence today - No lymphadenopathy - Recommend regular full body skin exams - Recommend daily broad spectrum sunscreen SPF 30+ to sun-exposed areas, reapply every 2 hours as needed.  - Call if any new or changing lesions are noted between office visits NEOPLASM OF UNCERTAIN BEHAVIOR OF SKIN R dorsal hand 4th-5th MCP Skin / nail biopsy Type of biopsy: tangential   Informed consent: discussed and consent obtained   Timeout: patient name, date of birth, surgical site, and procedure verified   Procedure prep:  Patient was prepped and draped in usual sterile fashion Prep type:  Isopropyl alcohol Anesthesia: the lesion was anesthetized in a standard fashion   Anesthetic:  1% lidocaine  w/ epinephrine 1-100,000 buffered w/ 8.4% NaHCO3 Instrument used: DermaBlade   Hemostasis achieved with: pressure and aluminum chloride   Outcome: patient tolerated procedure well   Post-procedure details: sterile dressing applied and wound care instructions given   Dressing type: bandage and petrolatum    Specimen 1 - Surgical pathology Differential Diagnosis: D48.5 r/o ISK vs SCC Check Margins: No MULTIPLE BENIGN NEVI   LENTIGINES   ACTINIC ELASTOSIS   CHERRY ANGIOMA  SEBORRHEIC KERATOSES    R 4th toe talon noir - due to trauma from stumping toe  Return in about 6 months (around 07/18/2024) for TBSE - hx SCC.  LILLETTE Rosina Mayans, CMA, am acting as scribe for  Boneta Sharps, MD .  Documentation: I have reviewed the above documentation for accuracy and completeness, and I agree with the above.  Boneta Sharps, MD

## 2024-01-23 ENCOUNTER — Ambulatory Visit: Payer: Self-pay | Admitting: Dermatology

## 2024-01-23 LAB — SURGICAL PATHOLOGY

## 2024-01-24 ENCOUNTER — Encounter: Payer: Self-pay | Admitting: Dermatology

## 2024-01-24 MED ORDER — FLUOROURACIL 5 % EX CREA: TOPICAL_CREAM | CUTANEOUS | 2 refills | Status: AC

## 2024-01-24 NOTE — Telephone Encounter (Signed)
 Discussed pathology results and treatment options with patient's wife. Prefers to treat with 5FU/Calcipotriene compound.  Advised: Wait two weeks after the biopsy to start applying the cream. Apply the cream twice per day until the redness and irritation develop (usually occurs by day 7), then stop and allow it to heal. We will recheck the area in 2 months to ensure the cancer is gone.

## 2024-01-24 NOTE — Telephone Encounter (Signed)
-----   Message from [facility] sent at 01/23/2024  9:11 PM EDT ----- Diagnosis 1. Skin, R dorsal hand 4th-5th MCP :       SQUAMOUS CELL CARCINOMA IN SITU    Please call with diagnosis and message me with patient's decision on treatment.   Explanation: Biopsy shows a squamous cell skin cancer limited to the top layer of skin. This means it is an early cancer and has not spread. However, it has the potential to spread beyond the skin  and threaten your health, so we recommend treating it.   Treatment option 1: a cream (fluorouracil  and calcipotriene) that helps your immune system clear the skin cancer. It will cause redness and irritation. Wait two weeks after the biopsy to start  applying the cream. Apply the cream twice per day until the redness and irritation develop (usually occurs by day 7), then stop and allow it to heal. We will recheck the area in 2 months to ensure  the cancer is gone. The cream is $45 plus shipping and will be mailed to you from a low cost compounding pharmacy.  Treatment option 2: Mohs surgery, which involves cutting out right around the skin cancer and then checking under the microscope on the same day to ensure the whole skin cancer is out. If there is  more cancer remaining, the surgeon will repeat the process until it is fully cleared. The cure rate is about 98-99%. It is done at another office outside of [place] ([place], [place], or  [place]). Once the Mohs surgeon confirms the skin cancer is out, they will discuss the options to repair or heal the area. You must take it easy for about two weeks after surgery (no lifting over  10-15 lbs, avoid activity to get your heart rate and blood pressure up). ----- Message ----- From: Interface, Lab In Three [facility] One Sent: 01/23/2024   5:57 PM EDT To: Boneta Sharps, MD

## 2024-02-01 ENCOUNTER — Ambulatory Visit: Payer: Self-pay | Admitting: Primary Care

## 2024-02-01 ENCOUNTER — Encounter: Payer: Self-pay | Admitting: Primary Care

## 2024-02-01 ENCOUNTER — Ambulatory Visit: Admitting: Primary Care

## 2024-02-01 VITALS — BP 128/64 | HR 69 | Temp 97.8°F | Ht 66.5 in | Wt 151.0 lb

## 2024-02-01 DIAGNOSIS — B009 Herpesviral infection, unspecified: Secondary | ICD-10-CM

## 2024-02-01 DIAGNOSIS — Z125 Encounter for screening for malignant neoplasm of prostate: Secondary | ICD-10-CM

## 2024-02-01 DIAGNOSIS — R600 Localized edema: Secondary | ICD-10-CM

## 2024-02-01 DIAGNOSIS — I517 Cardiomegaly: Secondary | ICD-10-CM

## 2024-02-01 DIAGNOSIS — I4819 Other persistent atrial fibrillation: Secondary | ICD-10-CM

## 2024-02-01 DIAGNOSIS — G4733 Obstructive sleep apnea (adult) (pediatric): Secondary | ICD-10-CM | POA: Diagnosis not present

## 2024-02-01 DIAGNOSIS — E7800 Pure hypercholesterolemia, unspecified: Secondary | ICD-10-CM | POA: Diagnosis not present

## 2024-02-01 LAB — LIPID PANEL
Cholesterol: 153 mg/dL (ref 0–200)
HDL: 66.6 mg/dL (ref >39.00)
LDL Cholesterol: 74 mg/dL (ref 0–99)
NonHDL: 86.35
Total CHOL/HDL Ratio: 2
Triglycerides: 63 mg/dL (ref 0.0–149.0)
VLDL: 12.6 mg/dL (ref 0.0–40.0)

## 2024-02-01 LAB — PSA, MEDICARE: PSA: 0.8 ng/mL (ref 0.10–4.00)

## 2024-02-01 NOTE — Assessment & Plan Note (Signed)
 Following with ophthalmology Continue acyclovir 400 mg twice daily.

## 2024-02-01 NOTE — Assessment & Plan Note (Signed)
 Following with cardiology, office notes reviewed from September 2025.  Continue apixaban  5 mg twice daily.

## 2024-02-01 NOTE — Assessment & Plan Note (Signed)
Repeat lipid panel pending. Remain off treatment. 

## 2024-02-01 NOTE — Progress Notes (Signed)
 Subjective:    Patient ID: Bryan Avila, male    DOB: 1940-07-02, 83 y.o.   MRN: [id number]  Bryan Avila is a very pleasant 83 y.o. male with a history of right bundle branch block, pulmonary hypertension, osteoarthritis, macular degeneration, atrial fibrillation, OSA who presents today for follow up.   He would like PSA screening today.  1) Atrial Fibrillation/HFmEF/Chronic Venous Insufficiency : Following with cardiology, last office visit was 01/11/2024.  Currently managed on metoprolol  succinate 25 mg daily, apixaban  5 mg twice daily, losartan  12.5 mg daily, furosemide  20 mg every other day.  During his most recent office visit his labs were deemed stable and there were no changes made to his plan.  He underwent echocardiogram in July 2025 which showed LVEF of 45-50%, low normal right sided function with enlarged ventricular size and moderately enlarged atrial chambers on left and right.  He denies chest pain, palpitations, dizziness.   2) Herpes Simplex: Following with ophthalmology and is managed on acyclovir 400 mg twice daily. He is compliant to his regimen. Was recently diagnosed with macular degeneration.   3) OSA: Currently managed on CPAP machine. He is compliant nightly. Follows with pulmonology. He has no concerns.   BP Readings from Last 3 Encounters:  02/01/24 128/64  01/11/24 127/75  12/01/23 118/66      Review of Systems  Respiratory:  Negative for shortness of breath.   Cardiovascular:  Negative for chest pain and palpitations.  Gastrointestinal:  Negative for constipation and diarrhea.  Genitourinary:  Negative for difficulty urinating.  Neurological:  Negative for headaches.  Psychiatric/Behavioral:  The patient is not nervous/anxious.          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 Tx with 5FU/Calcipotriene. 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   Would not want Tube Feeds.   Does not have a living will or HPOA.   Social Drivers of Health   Financial Resource Strain: Low Risk  (01/17/2024)   Overall Financial Resource Strain (CARDIA)    Difficulty of Paying Living Expenses: Not very hard  Food Insecurity: No Food Insecurity (01/17/2024)   Hunger Vital Sign    Worried  About Running Out of Food in the Last Year: Never true    Ran Out of Food in the Last Year: Never true  Transportation Needs: No Transportation Needs (01/17/2024)   PRAPARE - Administrator, Civil Service (Medical): No    Lack of Transportation (Non-Medical): No  Physical Activity: Sufficiently Active (01/17/2024)   Exercise Vital Sign    Days of Exercise per Week: 6 days    Minutes of Exercise per Session: 30 min  Stress: No Stress Concern Present (01/17/2024)   [name] of Occupational Health - Occupational Stress  Questionnaire    Feeling of Stress: Not at all  Social Connections: Socially Integrated (01/17/2024)   Social Connection and Isolation Panel    Frequency of Communication with Friends and Family: Twice a week    Frequency of Social Gatherings with Friends and Family: Twice a week    Attends Religious Services: More than 4 times per year    Active Member of [employer] or Organizations: Yes    Attends Banker Meetings: Never    Marital Status: Married  Catering manager Violence: Not At Risk (01/17/2024)   Humiliation, Afraid, Rape, and Kick questionnaire    Fear of Current or Ex-Partner: No    Emotionally Abused: No    Physically Abused: No    Sexually Abused: No    Past Surgical History:  Procedure Laterality Date   EYE SURGERY     thumb surgery  1999   right    Family History  Problem Relation Age of Onset   Arthritis Father    Sleep apnea Father    Alcohol abuse Brother    Prostate cancer Brother    Cancer Paternal Grandfather        prostate    Allergies  Allergen Reactions   Meperidine Other (See Comments) and Nausea Only    I almost died from that.-Severe bradycardia Other reaction(s): Unknown DECREASED BLOOD PRESSURE I almost died from that.-Severe bradycardia     Current Outpatient Medications on File Prior to Visit  Medication Sig Dispense Refill   acyclovir (ZOVIRAX) 400 MG tablet Take 400 mg by mouth 2 (two) times daily.  99   apixaban  (ELIQUIS ) 5 MG TABS tablet Take 1 tablet (5 mg total) by mouth 2 (two) times daily. 180 tablet 1   clindamycin  (CLEOCIN ) 150 MG capsule Take 1 capsule (150 mg total) by mouth 3 (three) times daily. 30 capsule 1   fluorouracil  (EFUDEX ) 5 % cream Apply the cream twice per day until the redness and irritation develop (usually occurs by day 7), then stop and allow it to heal. 30 g 2   furosemide  (LASIX ) 20 MG tablet Take 1 tablet (20 mg total) by mouth every other day. 45 tablet 3   losartan  (COZAAR ) 25 MG tablet Take  0.5 tablets (12.5 mg total) by mouth daily. 90 tablet 3   metoprolol  succinate (TOPROL -XL) 25 MG 24 hr tablet Take 1 tablet (25 mg total) by mouth daily. Take with or immediately following a meal. 90 tablet 3   Multiple Vitamins-Minerals (PRESERVISION AREDS PO) Take by mouth 2 (two) times daily.     potassium chloride  SA (KLOR-CON  M20) 20 MEQ tablet Take 1 tablet (20 mEq total) by mouth every other day. 45 tablet 3   timolol (BETIMOL) 0.5 % ophthalmic solution Place 1 drop into the right eye daily.     mupirocin  ointment (BACTROBAN ) 2 % Apply 1 Application topically daily. (Patient not taking: Reported on 02/01/2024)  22 g 0   NEOMYCIN -POLYMYXIN-HYDROCORTISONE (CORTISPORIN) 1 % SOLN OTIC solution Apply 1-2 drops to toe BID after soaking (Patient not taking: Reported on 02/01/2024) 10 mL 1   No current facility-administered medications on file prior to visit.    BP 128/64   Pulse 69   Temp 97.8 F (36.6 C) (Temporal)   Ht 5' 6.5 (1.689 m)   Wt 151 lb (68.5 kg)   SpO2 98%   BMI 24.01 kg/m  Objective:   Physical Exam Cardiovascular:     Rate and Rhythm: Normal rate. Rhythm irregular.  Pulmonary:     Effort: Pulmonary effort is normal.     Breath sounds: Normal breath sounds.  Abdominal:     General: Bowel sounds are normal.     Palpations: Abdomen is soft.     Tenderness: There is no abdominal tenderness.  Musculoskeletal:     Cervical back: Neck supple.  Skin:    General: Skin is warm and dry.  Neurological:     Mental Status: He is alert and oriented to person, place, and time.  Psychiatric:        Mood and Affect: Mood normal.     Physical Exam        Assessment & Plan:  Screening for prostate cancer -     PSA, Medicare  Pure hypercholesterolemia Assessment & Plan: Repeat lipid panel pending.  Remain off treatment.  Orders: -     Lipid panel  Herpes simplex Assessment & Plan: Following with ophthalmology Continue acyclovir 400 mg twice  daily.   Bilateral lower extremity edema Assessment & Plan: No edema noted today.  Continue furosemide  20 mg every other day and potassium chloride  20 mEq every other day.   OSA (obstructive sleep apnea) Assessment & Plan: Continue nightly CPAP. Following with pulmonology   Persistent atrial fibrillation [facility]) Assessment & Plan: Following with cardiology, office notes reviewed from September 2025.  Continue apixaban  5 mg twice daily.   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  Continue furosemide  20 mg every other day, potassium 20 mEq every other day.     Assessment and Plan Assessment & Plan         Comer MARLA Gaskins, NP     History of Present Illness

## 2024-02-01 NOTE — Assessment & Plan Note (Signed)
Continue nightly CPAP. Following with pulmonology.

## 2024-02-01 NOTE — Assessment & Plan Note (Signed)
 Following with cardiology, office notes reviewed from September 2025.  Continue furosemide  20 mg every other day, potassium 20 mEq every other day.

## 2024-02-01 NOTE — Assessment & Plan Note (Signed)
 No edema noted today.  Continue furosemide  20 mg every other day and potassium chloride  20 mEq every other day.

## 2024-02-21 ENCOUNTER — Ambulatory Visit (INDEPENDENT_AMBULATORY_CARE_PROVIDER_SITE_OTHER)

## 2024-02-21 DIAGNOSIS — Z23 Encounter for immunization: Secondary | ICD-10-CM

## 2024-03-02 ENCOUNTER — Ambulatory Visit (INDEPENDENT_AMBULATORY_CARE_PROVIDER_SITE_OTHER): Admitting: Podiatry

## 2024-03-02 ENCOUNTER — Encounter: Payer: Self-pay | Admitting: Podiatry

## 2024-03-02 DIAGNOSIS — M79676 Pain in unspecified toe(s): Secondary | ICD-10-CM

## 2024-03-02 DIAGNOSIS — B351 Tinea unguium: Secondary | ICD-10-CM | POA: Diagnosis not present

## 2024-03-02 NOTE — Progress Notes (Signed)
  Subjective:  Patient ID: Bryan Avila, male    DOB: 11/12/1940,  MRN: [id number]  83 y.o. male presents to clinic with  preventative diabetic foot care for painful elongated mycotic toenails 1-5 bilaterally which are tender when wearing enclosed shoe gear. Pain is relieved with periodic professional debridement. He is s/p great toe partial  matrixectomy by Dr. Verta. Chief Complaint  Patient presents with   Toe Pain    NP Gretta is his PCP. Last visit was in Sept. Denies being diabetic   New problem(s): None   PCP is Gretta Comer POUR, NP.  Allergies  Allergen Reactions   Meperidine Other (See Comments) and Nausea Only    I almost died from that.-Severe bradycardia Other reaction(s): Unknown DECREASED BLOOD PRESSURE I almost died from that.-Severe bradycardia     Review of Systems: Negative except as noted in the HPI.   Objective:  Bryan Avila is a pleasant 83 y.o. male WD, WN in NAD. AAO x 3.  Vascular Examination: Vascular status intact b/l with palpable pedal pulses. Pedal hair sparse.  Spider veins b/l LE. CFT immediate b/l. No edema. No pain with calf compression b/l. Skin temperature gradient WNL b/l.   Neurological Examination: Sensation grossly intact b/l with 10 gram monofilament.   Dermatological Examination: Pedal skin with normal turgor, texture and tone b/l. Toenails 1-5 b/l thick, discolored, elongated with subungual debris and pain on dorsal palpation. No hyperkeratotic lesions noted b/l.   Musculoskeletal Examination: Muscle strength 5/5 to b/l LE. No pain, crepitus or joint limitation noted with ROM bilateral LE. No gross bony deformities bilaterally.  Radiographs: None   Assessment:   1. Pain due to onychomycosis of toenail    Plan:  Patient was evaluated and treated. All patient's and/or POA's questions/concerns addressed on today's visit. Toenails 1-5 b/l debrided in length and girth without incident. Continue soft, supportive shoe gear daily.  Report any pedal injuries to medical professional. Call office if there are any questions/concerns. -Patient/POA to call should there be question/concern in the interim.  Return in about 3 months (around 06/02/2024).  Delon LITTIE Merlin, DPM      [place] LOCATION: [address], [place] [address]                   Office [phone]   [facility] LOCATION: [address] [place], [place] [address] Office [phone]

## 2024-03-22 ENCOUNTER — Telehealth: Payer: Self-pay

## 2024-03-22 NOTE — Telephone Encounter (Signed)
 I spoke with the patient's wife (DPR). She said [name] keeps calling them asking for some kind of record regarding his CPAP machine.   [name] will you reach out to [employer] and see what [name] is needing? Thank you!

## 2024-03-22 NOTE — Telephone Encounter (Signed)
 I tried to contact [employer] directly about [place]. No one seemed to know what I was talking about. Evalene ask me to check with [employer]. I have now sent urgent message to [employer] to see if they know [facility]

## 2024-03-22 NOTE — Telephone Encounter (Signed)
 Copied from CRM [id number]. Topic: General - Other >> Mar 22, 2024 10:29 AM Alfonso HERO wrote: Reason for CRM: Patients wife calling because [name] is needing addition info regarding continuation of care for patients CPAP.

## 2024-03-23 NOTE — Telephone Encounter (Signed)
 I received a message from [place] with [employer], Adine Finder, [name] GLADSTONE Joylene Adine; Tucker, Dolanda; Cain, Mitchell; Ziegler, Melissa Hello,  Here is the note on the account. We are needing face 2 face ov note to continue the coverage of the cpap. Patient was setup on 09-10-2023 :  1st Attempt:  Patient Issue with PAP 60f65f note: Patient doesn't have a 58f65f date scheduled on file. 75f65f notes still needed from PAP follow up appointment.  Resolution: Called and spk w/ patient and his wife, they understand he will need to make an appt with a doctor to discuss usage/benefit and review pap dl. They will let us  know when this is sch.  TELEHEALTH / TELEMED APPOINTMENTS ARE NO LONGER ACCEPTABLE AS OF 02/08/2024 due to the [employer] and requirements of Medicare Benefits. Appointments must be in-office, face-to-face.

## 2024-03-26 ENCOUNTER — Ambulatory Visit: Admitting: Dermatology

## 2024-03-29 NOTE — Telephone Encounter (Signed)
 The patient will need to have appt since he hasn't been seen since cpap setup

## 2024-04-02 NOTE — Telephone Encounter (Signed)
 Per earlier message, patient's wife is aware that he will need a follow up appt.   Nothing further needed.

## 2024-04-13 ENCOUNTER — Ambulatory Visit: Admitting: Sleep Medicine

## 2024-04-25 ENCOUNTER — Other Ambulatory Visit: Payer: Self-pay | Admitting: Cardiovascular Disease

## 2024-04-25 DIAGNOSIS — I4819 Other persistent atrial fibrillation: Secondary | ICD-10-CM

## 2024-04-25 NOTE — Telephone Encounter (Signed)
 Prescription refill request for Eliquis  received. Indication:afib Last office visit:9/25 Scr: 0.98  7/25 Age:83 Weight:68.5  kg  Prescription refilled

## 2024-04-27 ENCOUNTER — Encounter: Payer: Self-pay | Admitting: Sleep Medicine

## 2024-04-27 ENCOUNTER — Ambulatory Visit: Admitting: Sleep Medicine

## 2024-04-27 VITALS — BP 100/60 | HR 75 | Temp 98.8°F | Ht 66.5 in | Wt 148.2 lb

## 2024-04-27 DIAGNOSIS — G4733 Obstructive sleep apnea (adult) (pediatric): Secondary | ICD-10-CM

## 2024-04-27 DIAGNOSIS — I10 Essential (primary) hypertension: Secondary | ICD-10-CM

## 2024-04-27 DIAGNOSIS — I4891 Unspecified atrial fibrillation: Secondary | ICD-10-CM

## 2024-04-27 NOTE — Patient Instructions (Signed)

## 2024-04-27 NOTE — Progress Notes (Signed)
 "      Name:Bryan Avila MRN: [id number] DOB: [birth year]   CHIEF COMPLAINT:  CPAP F/U   HISTORY OF PRESENT ILLNESS:  Mr. Brogden is a 83 y.o. w/ a h/o OSA, HTN and atrial fibrillation who presents for CPAP F/U visit. Reports using CPAP therapy every night, which is confirmed by compliance data. He is currently using the Airfit F40 FFM, which is comfortable. Reports intermittent air leaks. Reports feeling significantly more refreshed upon awakening with CPAP therapy.    EPWORTH SLEEP SCORE     08/08/2023   10:00 AM  Results of the Epworth flowsheet  Sitting and reading 0  Watching TV 1  Sitting, inactive in a public place (e.g. a theatre or a meeting) 0  As a passenger in a car for an hour without a break 0  Lying down to rest in the afternoon when circumstances permit 2  Sitting and talking to someone 0  Sitting quietly after a lunch without alcohol 0  In a car, while stopped for a few minutes in traffic 0  Total score 3   PAST MEDICAL HISTORY :   has a past medical history of A-fib (HCC) (2025), Basal cell carcinoma (07/05/2018), Basal cell carcinoma (05/17/2019), Cataract (03/2009), Chronic cough (07/06/2018), Colon cancer screening (01/01/2022), Hyperlipidemia, Osteoporosis, Pain of right lower extremity (05/14/2019), Palpable purpura (04/24/2016), Rectal bleeding (11/08/2023), SCC (squamous cell carcinoma) (06/14/2023), and Squamous cell carcinoma in situ (01/19/2024).  has a past surgical history that includes thumb surgery (1999) and Eye surgery. Prior to Admission medications   Medication Sig Start Date End Date Taking? Authorizing Provider  acyclovir (ZOVIRAX) 400 MG tablet Take 400 mg by mouth 2 (two) times daily. 07/06/15  Yes [provider]  apixaban  (ELIQUIS ) 5 MG TABS tablet Take 1 tablet (5 mg total) by mouth 2 (two) times daily. 07/04/23  Yes Gollan, Timothy J, MD  furosemide  (LASIX ) 20 MG tablet Take 1 tablet (20 mg total) by mouth every other day. 07/04/23  10/02/23 Yes Gollan, Timothy J, MD  losartan  (COZAAR ) 25 MG tablet Take 0.5 tablets (12.5 mg total) by mouth daily. 08/04/23 11/02/23 Yes Dunn, Bernardino HERO, PA-C  metoprolol  succinate (TOPROL -XL) 25 MG 24 hr tablet Take 1 tablet (25 mg total) by mouth daily. Take with or immediately following a meal. 07/04/23 10/02/23 Yes Gollan, Timothy J, MD  Multiple Vitamins-Minerals (PRESERVISION AREDS PO) Take by mouth 2 (two) times daily.   Yes [provider]  potassium chloride  SA (KLOR-CON  M20) 20 MEQ tablet Take 1 tablet (20 mEq total) by mouth every other day. 07/04/23 10/02/23 Yes Gollan, Timothy J, MD  timolol (BETIMOL) 0.5 % ophthalmic solution Place 1 drop into the right eye daily. 07/11/23  Yes [provider]   Allergies  Allergen Reactions   Meperidine Other (See Comments) and Nausea Only    I almost died from that.-Severe bradycardia Other reaction(s): Unknown DECREASED BLOOD PRESSURE I almost died from that.-Severe bradycardia    Meperidine Hcl     Other Reaction(s): Unknown    FAMILY HISTORY:  family history includes Alcohol abuse in his brother; Arthritis in his father; Cancer in his paternal grandfather; Prostate cancer in his brother; Sleep apnea in his father. SOCIAL HISTORY:  reports that he has never smoked. He has never used smokeless tobacco. He reports that he does not drink alcohol and does not use drugs.   Review of Systems:  Gen:  Denies  fever, sweats, chills weight loss  HEENT: Denies blurred vision, double vision,  ear pain, eye pain, hearing loss, nose bleeds, sore throat Cardiac:  No dizziness, chest pain or heaviness, chest tightness,edema, No JVD Resp:   No cough, -sputum production, -shortness of breath,-wheezing, -hemoptysis,  Gi: Denies swallowing difficulty, stomach pain, nausea or vomiting, diarrhea, constipation, bowel incontinence Gu:  Denies bladder incontinence, burning urine Ext:   Denies Joint pain, stiffness or swelling Skin: Denies  skin  rash, easy bruising or bleeding or hives Endoc:  Denies polyuria, polydipsia , polyphagia or weight change Psych:   Denies depression, insomnia or hallucinations  Other:  All other systems negative  VITAL SIGNS: BP 100/60   Pulse 75   Temp 98.8 F (37.1 C)   Ht 5' 6.5 (1.689 m)   Wt 148 lb 3.2 oz (67.2 kg)   SpO2 98%   BMI 23.56 kg/m     Physical Examination:   General Appearance: No distress  EYES PERRLA, EOM intact.   NECK Supple, No JVD Pulmonary: normal breath sounds, No wheezing.  CardiovascularNormal S1,S2.  No m/r/g.   Abdomen: Benign, Soft, non-tender. Skin:   warm, no rashes, no ecchymosis  Extremities: normal, no cyanosis, clubbing. Neuro:without focal findings,  speech normal  PSYCHIATRIC: Mood, affect within normal limits.   ASSESSMENT AND PLAN  OSA Patient is using and benefiting from CPAP therapy. Discussed the consequences of untreated sleep apnea. Advised not to drive drowsy for safety of patient and others. Will follow up in 1 year.    HTN Stable, on current management. Following with PCP.   Atrial fibrillation Stable, on current management.    Patient  satisfied with Plan of action and management. All questions answered  I spent a total of 28 minutes reviewing chart data, face-to-face evaluation with the patient, counseling and coordination of care as detailed above.    Katheen Aslin, M.D.  [facility]        "

## 2024-05-21 ENCOUNTER — Telehealth: Payer: Self-pay

## 2024-05-21 NOTE — Telephone Encounter (Signed)
 Copied from CRM #[id number]. Topic: Clinical - Order For Equipment >> May 21, 2024  8:56 AM Corean SAUNDERS wrote: Reason for CRM: Patients wife Gareld is requesting Dr. Jess to please place an order for new CPAP supplies for patient.

## 2024-05-21 NOTE — Telephone Encounter (Signed)
 Bryan Avila reports that she contacted [employer] again. And order has been placed. NFN.

## 2024-06-01 ENCOUNTER — Encounter: Payer: Self-pay | Admitting: Podiatry

## 2024-06-01 ENCOUNTER — Ambulatory Visit: Admitting: Podiatry

## 2024-06-01 DIAGNOSIS — D225 Melanocytic nevi of trunk: Secondary | ICD-10-CM | POA: Insufficient documentation

## 2024-06-01 DIAGNOSIS — Z808 Family history of malignant neoplasm of other organs or systems: Secondary | ICD-10-CM | POA: Insufficient documentation

## 2024-06-01 DIAGNOSIS — M79676 Pain in unspecified toe(s): Secondary | ICD-10-CM

## 2024-06-01 DIAGNOSIS — D2370 Other benign neoplasm of skin of unspecified lower limb, including hip: Secondary | ICD-10-CM | POA: Insufficient documentation

## 2024-06-01 DIAGNOSIS — Z85828 Personal history of other malignant neoplasm of skin: Secondary | ICD-10-CM | POA: Insufficient documentation

## 2024-06-01 DIAGNOSIS — Z87898 Personal history of other specified conditions: Secondary | ICD-10-CM | POA: Insufficient documentation

## 2024-06-01 DIAGNOSIS — B351 Tinea unguium: Secondary | ICD-10-CM

## 2024-06-01 NOTE — Progress Notes (Signed)
"  °  Subjective:  Patient ID: Bryan Avila, male    DOB: 27-Sep-1940,  MRN: [id number]  Bryan Avila presents to clinic today for painful thick toenails that are difficult to trim. Pain interferes with ambulation. Aggravating factors include wearing enclosed shoe gear. Pain is relieved with periodic professional debridement.  Chief Complaint  Patient presents with   Nail Problem    RFC. He saw NP Gretta recently and denies being diabetic   New problem(s): None.   PCP is Gretta Comer POUR, NP.  Allergies[1]  Review of Systems: Negative except as noted in the HPI.  Objective: No changes noted in today's physical examination. There were no vitals filed for this visit. Bryan Avila is a pleasant 84 y.o. male WD, WN in NAD. AAO x 3.  Vascular Examination: Vascular status intact b/l with palpable pedal pulses. Pedal hair sparse.  Spider veins b/l LE. CFT immediate b/l. No edema. No pain with calf compression b/l. Skin temperature gradient WNL b/l.   Neurological Examination: Sensation grossly intact b/l with 10 gram monofilament.   Dermatological Examination: Pedal skin with normal turgor, texture and tone b/l. Toenails 1-5 b/l thick, discolored, elongated with subungual debris and pain on dorsal palpation. No hyperkeratotic lesions noted b/l.   Musculoskeletal Examination: Muscle strength 5/5 to b/l LE. No pain, crepitus or joint limitation noted with ROM bilateral LE. No gross bony deformities bilaterally.  Radiographs: None  Assessment/Plan: 1. Pain due to onychomycosis of toenail   Consent given for treatment. Patient examined. All patient's and/or POA's questions/concerns addressed on today's visit. Mycotic toenails 1-5 b/l debrided in length and girth without incident. Continue soft, supportive shoe gear daily. Report any pedal injuries to medical professional. Call office if there are any quesitons/concerns. -Patient/POA to call should there be question/concern in the interim.    Return in about 3 months (around 08/30/2024).  Bryan Avila, DPM      [place] LOCATION: [address] [place] [address]                   Office [phone]   [place] LOCATION: [address] [place], [place] [address] Office [phone]      [1]  Allergies Allergen Reactions   Meperidine Other (See Comments) and Nausea Only    I almost died from that.-Severe bradycardia Other reaction(s): Unknown DECREASED BLOOD PRESSURE I almost died from that.-Severe bradycardia    Meperidine Hcl     Other Reaction(s): Unknown   "

## 2024-07-17 ENCOUNTER — Ambulatory Visit: Admitting: Dermatology

## 2024-08-30 ENCOUNTER — Ambulatory Visit: Admitting: Podiatry

## 2025-01-17 ENCOUNTER — Ambulatory Visit

## 2025-01-18 ENCOUNTER — Ambulatory Visit

## 2025-01-24 ENCOUNTER — Ambulatory Visit: Admitting: Dermatology
